# Patient Record
Sex: Female | Born: 2000 | Race: Black or African American | Hispanic: No | Marital: Single | Smoking: Never smoker
Health system: Southern US, Community
[De-identification: ages and names within clinical notes are randomized; demographics above are authoritative.]

## PROBLEM LIST (undated history)

## (undated) DIAGNOSIS — D649 Anemia, unspecified: Secondary | ICD-10-CM

## (undated) DIAGNOSIS — J45909 Unspecified asthma, uncomplicated: Secondary | ICD-10-CM

## (undated) HISTORY — PX: NO PAST SURGERIES: SHX2092

---
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
## 2019-01-13 ENCOUNTER — Inpatient Hospital Stay (HOSPITAL_COMMUNITY): Payer: Medicaid Other

## 2019-01-13 ENCOUNTER — Inpatient Hospital Stay (HOSPITAL_COMMUNITY)
Admission: EM | Admit: 2019-01-13 | Discharge: 2019-01-13 | Disposition: A | Discharge status: Home / Self Care | Payer: Medicaid Other | Attending: Obstetrics and Gynecology | Admitting: Obstetrics and Gynecology

## 2019-01-13 ENCOUNTER — Other Ambulatory Visit: Payer: Self-pay

## 2019-01-13 ENCOUNTER — Encounter (HOSPITAL_COMMUNITY): Payer: Self-pay

## 2019-01-13 DIAGNOSIS — O219 Vomiting of pregnancy, unspecified: Secondary | ICD-10-CM | POA: Diagnosis not present

## 2019-01-13 DIAGNOSIS — R109 Unspecified abdominal pain: Secondary | ICD-10-CM | POA: Diagnosis not present

## 2019-01-13 DIAGNOSIS — O468X1 Other antepartum hemorrhage, first trimester: Secondary | ICD-10-CM

## 2019-01-13 DIAGNOSIS — Z3491 Encounter for supervision of normal pregnancy, unspecified, first trimester: Secondary | ICD-10-CM

## 2019-01-13 DIAGNOSIS — O208 Other hemorrhage in early pregnancy: Secondary | ICD-10-CM | POA: Insufficient documentation

## 2019-01-13 DIAGNOSIS — Z3A01 Less than 8 weeks gestation of pregnancy: Secondary | ICD-10-CM | POA: Insufficient documentation

## 2019-01-13 DIAGNOSIS — O26891 Other specified pregnancy related conditions, first trimester: Secondary | ICD-10-CM | POA: Insufficient documentation

## 2019-01-13 DIAGNOSIS — F17210 Nicotine dependence, cigarettes, uncomplicated: Secondary | ICD-10-CM | POA: Diagnosis not present

## 2019-01-13 DIAGNOSIS — Z79899 Other long term (current) drug therapy: Secondary | ICD-10-CM | POA: Insufficient documentation

## 2019-01-13 DIAGNOSIS — O418X10 Other specified disorders of amniotic fluid and membranes, first trimester, not applicable or unspecified: Secondary | ICD-10-CM | POA: Diagnosis not present

## 2019-01-13 DIAGNOSIS — O99511 Diseases of the respiratory system complicating pregnancy, first trimester: Secondary | ICD-10-CM | POA: Insufficient documentation

## 2019-01-13 DIAGNOSIS — J45909 Unspecified asthma, uncomplicated: Secondary | ICD-10-CM | POA: Insufficient documentation

## 2019-01-13 DIAGNOSIS — O99331 Smoking (tobacco) complicating pregnancy, first trimester: Secondary | ICD-10-CM | POA: Diagnosis not present

## 2019-01-13 LAB — URINALYSIS, ROUTINE W REFLEX MICROSCOPIC
Bacteria, UA: NONE SEEN
Bilirubin Urine: NEGATIVE
Glucose, UA: NEGATIVE mg/dL
Hgb urine dipstick: NEGATIVE
Ketones, ur: 80 mg/dL — AB
Nitrite: NEGATIVE
Protein, ur: NEGATIVE mg/dL
Specific Gravity, Urine: 1.019 (ref 1.005–1.030)
pH: 6 (ref 5.0–8.0)

## 2019-01-13 LAB — POCT PREGNANCY, URINE: Preg Test, Ur: POSITIVE — AB

## 2019-01-13 MED ORDER — FAMOTIDINE IN NACL 20-0.9 MG/50ML-% IV SOLN
20.0000 mg | Freq: Once | INTRAVENOUS | Status: AC
  Administered 2019-01-13: 19:00:00 20 mg via INTRAVENOUS
  Filled 2019-01-13: qty 50

## 2019-01-13 MED ORDER — LACTATED RINGERS IV BOLUS
1000.0000 mL | Freq: Once | INTRAVENOUS | Status: AC
  Administered 2019-01-13: 1000 mL via INTRAVENOUS

## 2019-01-13 MED ORDER — METOCLOPRAMIDE HCL 10 MG PO TABS: 10.0000 mg | ORAL_TABLET | Freq: Three times a day (TID) | ORAL | 0 refills | Status: DC | PRN

## 2019-01-13 MED ORDER — PROMETHAZINE HCL 25 MG/ML IJ SOLN
25.0000 mg | Freq: Once | INTRAMUSCULAR | Status: AC
  Administered 2019-01-13: 19:00:00 25 mg via INTRAVENOUS
  Filled 2019-01-13: qty 1

## 2019-01-13 NOTE — MAU Note (Signed)
Pt states she has been diagnosed with BV but has not started her prescription yet due to n/v

## 2019-01-13 NOTE — MAU Note (Signed)
Carol Hendrix is a 18 y.o. at Unknown here in MAU reporting:  +emesis +lower abdominal pain +headache (history migraines) LMP: patient states she is not sure. Went to [facility] and was told her due date was may 10th.  Onset of complaint: 0600 today. Has taken promethazine that she had with her last pregnancy. Took twice this morning but threw it up. Pain score: 7/10 abdominal pain; 10/10 headache Vitals:   01/13/19 1719  BP: 113/70  Pulse: 67  Resp: 16  Temp: 98.2 F (36.8 C)  SpO2: 100%   Lab orders placed from triage: ua and poc pregnancy test  Rn asked patient to leave urine sample. Patient states she could not at this time. RN explained importance due to c/o emesis and need for pregnancy confirmation.   Patient expressed frustration with wait and asked for explanation on triage process. RN explained process and answered all questions.

## 2019-01-13 NOTE — MAU Provider Note (Signed)
Chief Complaint: Emesis, Abdominal Pain, and Headache   First Provider Initiated Contact with Patient 01/13/19 1752     SUBJECTIVE HPI: Carol Hendrix is a 18 y.o. G2P1001 at 6839w6d who presents to Maternity Admissions reporting nausea/vomiting & abdominal pain. Symptoms started this morning. Has vomited over 10 times today. Does not have antiemetic at home. Reports pain throughout her abdomen that is worse when she vomits. Denies diarrhea, constipation, dysuria, vaginal bleeding, vaginal discharge, or fever/chills. [facility] Had an ultrasound in the office that confirmed IUP & gave her due date of 09/02/19.   Location: abdomen Quality: cramping Severity: 7/10 on pain scale Duration: 1 day Timing: intermittent Modifying factors: worse when she vomits Associated signs and symptoms: n/v  Past Medical History:  Diagnosis Date  . Asthma    OB History  Gravida Para Term Preterm AB Living  2 1 1  0 0 1  SAB TAB Ectopic Multiple Live Births  0 0 0 0 1    # Outcome Date GA Lbr Len/2nd Weight Sex Delivery Anes PTL Lv  2 Current           1 Term 08/01/18 4826w4d 02:18 / 00:31 2999 g F Vag-Spont EPI  LIV     Birth Comments: wnl   History reviewed. No pertinent surgical history. Social History   Socioeconomic History  . Marital status: Single    Spouse name: Not on file  . Number of children: Not on file  . Years of education: Not on file  . Highest education level: Not on file  Occupational History  . Not on file  Social Needs  . Financial resource strain: Not on file  . Food insecurity    Worry: Not on file    Inability: Not on file  . Transportation needs    Medical: Not on file    Non-medical: Not on file  Tobacco Use  . Smoking status: Current Every Day Smoker    Packs/day: 0.25    Types: Cigarettes  . Smokeless tobacco: Never Used  . Tobacco comment: rolls marijuana in with tobacco  Substance and Sexual Activity  . Alcohol use: Yes    Comment: social    . Drug use: Yes    Types: Marijuana    Comment: about a week ago  . Sexual activity: Yes  Lifestyle  . Physical activity    Days per week: Not on file    Minutes per session: Not on file  . Stress: Not on file  Relationships  . Social Musicianconnections    Talks on phone: Not on file    Gets together: Not on file    Attends religious service: Not on file    Active member of club or organization: Not on file    Attends meetings of clubs or organizations: Not on file    Relationship status: Not on file  . Intimate partner violence    Fear of current or ex partner: Not on file    Emotionally abused: Not on file    Physically abused: Not on file    Forced sexual activity: Not on file  Other Topics Concern  . Not on file  Social History Narrative  . Not on file   Family History  Problem Relation Age of Onset  . Healthy Mother   . Healthy Father    No current facility-administered medications on file prior to encounter.    Current Outpatient Medications on File Prior to Encounter  Medication Sig  Dispense Refill  . albuterol (PROVENTIL HFA;VENTOLIN HFA) 108 (90 Base) MCG/ACT inhaler Inhale 1-2 puffs into the lungs every 6 (six) hours as needed for wheezing or shortness of breath. 1 Inhaler 0  . albuterol (PROVENTIL) (2.5 MG/3ML) 0.083% nebulizer solution Take 3 mLs (2.5 mg total) by nebulization every 6 (six) hours as needed for wheezing or shortness of breath. 75 mL 0  . cetirizine (ZYRTEC) 10 MG tablet Take 1 tablet (10 mg total) by mouth daily. 30 tablet 11  . fluticasone (FLONASE) 50 MCG/ACT nasal spray Place 2 sprays into both nostrils daily. 1 g 0  . [DISCONTINUED] ipratropium (ATROVENT) 0.03 % nasal spray Place 2 sprays into both nostrils 2 (two) times daily. (Patient not taking: Reported on 07/31/2018) 30 mL 0   No Known Allergies  I have reviewed patient's Past Medical Hx, Surgical Hx, Family Hx, Social Hx, medications and allergies.   Review of Systems  Constitutional:  Negative.   Gastrointestinal: Positive for abdominal pain, nausea and vomiting. Negative for constipation and diarrhea.  Genitourinary: Negative.     OBJECTIVE Patient Vitals for the past 24 hrs:  BP Temp Temp src Pulse Resp SpO2 Weight  01/13/19 1954 106/72 - - 69 - - -  01/13/19 1719 113/70 98.2 F (36.8 C) Oral 67 16 100 % 63.7 kg   Constitutional: Well-developed, well-nourished female in no acute distress.  Cardiovascular: normal rate & rhythm, no murmur Respiratory: normal rate and effort. Lung sounds clear throughout GI: Abd soft, non-tender, Pos BS x 4. No guarding or rebound tenderness MS: Extremities nontender, no edema, normal ROM Neurologic: Alert and oriented x 4.      LAB RESULTS Results for orders placed or performed during the hospital encounter of 01/13/19 (from the past 24 hour(s))  Urinalysis, Routine w reflex microscopic     Status: Abnormal   Collection Time: 01/13/19  5:40 PM  Result Value Ref Range   Color, Urine YELLOW YELLOW   APPearance HAZY (A) CLEAR   Specific Gravity, Urine 1.019 1.005 - 1.030   pH 6.0 5.0 - 8.0   Glucose, UA NEGATIVE NEGATIVE mg/dL   Hgb urine dipstick NEGATIVE NEGATIVE   Bilirubin Urine NEGATIVE NEGATIVE   Ketones, ur 80 (A) NEGATIVE mg/dL   Protein, ur NEGATIVE NEGATIVE mg/dL   Nitrite NEGATIVE NEGATIVE   Leukocytes,Ua SMALL (A) NEGATIVE   RBC / HPF 0-5 0 - 5 RBC/hpf   WBC, UA 6-10 0 - 5 WBC/hpf   Bacteria, UA NONE SEEN NONE SEEN   Squamous Epithelial / LPF 6-10 0 - 5   Mucus PRESENT   Pregnancy, urine POC     Status: Abnormal   Collection Time: 01/13/19  5:41 PM  Result Value Ref Range   Preg Test, Ur POSITIVE (A) NEGATIVE    IMAGING US Ob Comp Less 14 Wks  Result Date: 01/13/2019 CLINICAL DATA:  Pregnant patient in first-trimester pregnancy with abdominal pain. EXAM: OBSTETRIC <14 WK ULTRASOUND TECHNIQUE: Transabdominal ultrasound was performed for evaluation of the gestation as well as the maternal uterus and adnexal  regions. COMPARISON:  None this pregnancy. FINDINGS: Intrauterine gestational sac: Single Yolk sac:  Visualized. Embryo:  Visualized. Cardiac Activity: Visualized. Heart Rate: 157 bpm CRL:   14.4 mm   7 w 5 d                  [place] EDC: 08/28/2019 Subchorionic hemorrhage: Small, primarily lateral to the gestational sac. Maternal uterus/adnexae: Left ovary is normal measuring 3.4 x 1.8 x  2.1 cm. The right ovary is normal measuring 2.5 x 1.8 x 2.2 cm. No adnexal mass. No pelvic free fluid. IMPRESSION: 1. Single live intrauterine pregnancy estimated gestational age based on crown-rump length 7 weeks 5 days for estimated date of delivery 08/28/2019. 2. Small subchorionic hemorrhage. Electronically Signed   By: Narda Rutherford M.D.   On: 01/13/2019 19:40    MAU COURSE Orders Placed This Encounter  Procedures  . US OB Comp Less 14 Wks  . Urinalysis, Routine w reflex microscopic  . Pregnancy, urine POC  . Discharge patient   Meds ordered this encounter  Medications  . lactated ringers bolus 1,000 mL  . promethazine (PHENERGAN) injection 25 mg  . famotidine (PEPCID) IVPB 20 mg premix  . metoCLOPramide (REGLAN) 10 MG tablet    Sig: Take 1 tablet (10 mg total) by mouth every 8 (eight) hours as needed for nausea.    Dispense:  30 tablet    Refill:  0    Order Specific Question:   Supervising Provider    Answer:   CONSTANT, PEGGY [[id number]]    MDM VSS. Abdomen soft & non tender on exam  IV fluids, phenergan, & pepcid given. Pt vomited once after meds given then reports feeling better and able to keep down water  Ultrasound shows live IUP with small [facility]. Reviewed results with patient  ASSESSMENT 1. Nausea and vomiting during pregnancy prior to [redacted] weeks gestation   2. Abdominal pain during pregnancy in first trimester   3. Normal IUP (intrauterine pregnancy) on prenatal ultrasound, first trimester   4. Subchorionic hematoma in first trimester, single or unspecified fetus     PLAN Discharge home in  stable condition. Discussed reasons to return to MAU Rx reglan  Follow-up Information    Ob/Gyn, [facility] Follow up.   Contact information: [address] [place] [address] [phone]          Allergies as of 01/13/2019   No Known Allergies     Medication List    TAKE these medications   albuterol 108 (90 Base) MCG/ACT inhaler Commonly known as: VENTOLIN HFA Inhale 1-2 puffs into the lungs every 6 (six) hours as needed for wheezing or shortness of breath.   albuterol (2.5 MG/3ML) 0.083% nebulizer solution Commonly known as: PROVENTIL Take 3 mLs (2.5 mg total) by nebulization every 6 (six) hours as needed for wheezing or shortness of breath.   cetirizine 10 MG tablet Commonly known as: ZYRTEC Take 1 tablet (10 mg total) by mouth daily.   fluticasone 50 MCG/ACT nasal spray Commonly known as: FLONASE Place 2 sprays into both nostrils daily.   metoCLOPramide 10 MG tablet Commonly known as: REGLAN Take 1 tablet (10 mg total) by mouth every 8 (eight) hours as needed for nausea.        Judeth Horn, NP 01/13/2019  8:04 PM

## 2019-01-13 NOTE — Discharge Instructions (Signed)
Morning Sickness ° °Morning sickness is when a woman feels nauseous during pregnancy. This nauseous feeling may or may not come with vomiting. It often occurs in the morning, but it can be a problem at any time of day. Morning sickness is most common during the first trimester. In some cases, it may continue throughout pregnancy. Although morning sickness is unpleasant, it is usually harmless unless the woman develops severe and continual vomiting (hyperemesis gravidarum), a condition that requires more intense treatment. °What are the causes? °The exact cause of this condition is not known, but it seems to be related to normal hormonal changes that occur in pregnancy. °What increases the risk? °You are more likely to develop this condition if: °· You experienced nausea or vomiting before your pregnancy. °· You had morning sickness during a previous pregnancy. °· You are pregnant with more than one baby, such as twins. °What are the signs or symptoms? °Symptoms of this condition include: °· Nausea. °· Vomiting. °How is this diagnosed? °This condition is usually diagnosed based on your signs and symptoms. °How is this treated? °In many cases, treatment is not needed for this condition. Making some changes to what you eat may help to control symptoms. Your health care provider may also prescribe or recommend: °· Vitamin B6 supplements. °· Anti-nausea medicines. °· Ginger. °Follow these instructions at home: °Medicines °· Take over-the-counter and prescription medicines only as told by your health care provider. Do not use any prescription, over-the-counter, or herbal medicines for morning sickness without first talking with your health care provider. °· Taking multivitamins before getting pregnant can prevent or decrease the severity of morning sickness in most women. °Eating and drinking °· Eat a piece of dry toast or crackers before getting out of bed in the morning. °· Eat 5 or 6 small meals a day. °· Eat dry and  bland foods, such as rice or a baked potato. Foods that are high in carbohydrates are often helpful. °· Avoid greasy, fatty, and spicy foods. °· Have someone cook for you if the smell of any food causes nausea and vomiting. °· If you feel nauseous after taking prenatal vitamins, take the vitamins at night or with a snack. °· Snack on protein foods between meals if you are hungry. Nuts, yogurt, and cheese are good options. °· Drink fluids throughout the day. °· Try ginger ale made with real ginger, ginger tea made from fresh grated ginger, or ginger candies. °General instructions °· Do not use any products that contain nicotine or tobacco, such as cigarettes and e-cigarettes. If you need help quitting, ask your health care provider. °· Get an air purifier to keep the air in your house free of odors. °· Get plenty of fresh air. °· Try to avoid odors that trigger your nausea. °· Consider trying these methods to help relieve symptoms: °? Wearing an acupressure wristband. These wristbands are often worn for seasickness. °? Acupuncture. °Contact a health care provider if: °· Your home remedies are not working and you need medicine. °· You feel dizzy or light-headed. °· You are losing weight. °Get help right away if: °· You have persistent and uncontrolled nausea and vomiting. °· You faint. °· You have severe pain in your abdomen. °Summary °· Morning sickness is when a woman feels nauseous during pregnancy. This nauseous feeling may or may not come with vomiting. °· Morning sickness is most common during the first trimester. °· It often occurs in the morning, but it can be a problem at   any time of day. °· In many cases, treatment is not needed for this condition. Making some changes to what you eat may help to control symptoms. °This information is not intended to replace advice given to you by your health care provider. Make sure you discuss any questions you have with your health care provider. °Document Released:  06/02/2006 Document Revised: 03/24/2017 Document Reviewed: 05/14/2016 °Elsevier Patient Education © 2020 Elsevier Inc. ° °

## 2019-01-13 NOTE — MAU Note (Signed)
Reports she had an u/s in the office and they saw a heartbeat

## 2019-02-07 LAB — OB RESULTS CONSOLE RUBELLA ANTIBODY, IGM: Rubella: IMMUNE

## 2019-02-07 LAB — OB RESULTS CONSOLE GC/CHLAMYDIA
Chlamydia: NEGATIVE
Gonorrhea: NEGATIVE

## 2019-02-07 LAB — OB RESULTS CONSOLE ANTIBODY SCREEN: Antibody Screen: NEGATIVE

## 2019-02-07 LAB — OB RESULTS CONSOLE ABO/RH: RH Type: POSITIVE

## 2019-02-07 LAB — OB RESULTS CONSOLE HIV ANTIBODY (ROUTINE TESTING): HIV: NONREACTIVE

## 2019-02-07 LAB — OB RESULTS CONSOLE RPR: RPR: NONREACTIVE

## 2019-02-07 LAB — OB RESULTS CONSOLE HEPATITIS B SURFACE ANTIGEN: Hepatitis B Surface Ag: NEGATIVE

## 2019-03-20 ENCOUNTER — Encounter (HOSPITAL_COMMUNITY): Payer: Self-pay | Admitting: *Deleted

## 2019-03-20 ENCOUNTER — Inpatient Hospital Stay (HOSPITAL_COMMUNITY)
Admission: EM | Admit: 2019-03-20 | Discharge: 2019-03-20 | Disposition: A | Discharge status: Home / Self Care | Payer: Medicaid Other | Attending: Obstetrics | Admitting: Obstetrics

## 2019-03-20 ENCOUNTER — Other Ambulatory Visit: Payer: Self-pay

## 2019-03-20 DIAGNOSIS — Z79899 Other long term (current) drug therapy: Secondary | ICD-10-CM | POA: Insufficient documentation

## 2019-03-20 DIAGNOSIS — O99512 Diseases of the respiratory system complicating pregnancy, second trimester: Secondary | ICD-10-CM | POA: Insufficient documentation

## 2019-03-20 DIAGNOSIS — E860 Dehydration: Secondary | ICD-10-CM | POA: Diagnosis not present

## 2019-03-20 DIAGNOSIS — O219 Vomiting of pregnancy, unspecified: Secondary | ICD-10-CM | POA: Diagnosis present

## 2019-03-20 DIAGNOSIS — Z87891 Personal history of nicotine dependence: Secondary | ICD-10-CM | POA: Insufficient documentation

## 2019-03-20 DIAGNOSIS — Z3A16 16 weeks gestation of pregnancy: Secondary | ICD-10-CM | POA: Diagnosis not present

## 2019-03-20 DIAGNOSIS — O99282 Endocrine, nutritional and metabolic diseases complicating pregnancy, second trimester: Secondary | ICD-10-CM | POA: Insufficient documentation

## 2019-03-20 DIAGNOSIS — J45909 Unspecified asthma, uncomplicated: Secondary | ICD-10-CM | POA: Diagnosis not present

## 2019-03-20 DIAGNOSIS — O210 Mild hyperemesis gravidarum: Secondary | ICD-10-CM | POA: Diagnosis not present

## 2019-03-20 LAB — URINALYSIS, ROUTINE W REFLEX MICROSCOPIC
Bacteria, UA: NONE SEEN
Bilirubin Urine: NEGATIVE
Glucose, UA: NEGATIVE mg/dL
Hgb urine dipstick: NEGATIVE
Ketones, ur: 80 mg/dL — AB
Leukocytes,Ua: NEGATIVE
Nitrite: NEGATIVE
Protein, ur: 30 mg/dL — AB
Specific Gravity, Urine: 1.026 (ref 1.005–1.030)
pH: 6 (ref 5.0–8.0)

## 2019-03-20 LAB — CBC
HCT: 34.7 % — ABNORMAL LOW (ref 36.0–46.0)
Hemoglobin: 11.2 g/dL — ABNORMAL LOW (ref 12.0–15.0)
MCH: 26 pg (ref 26.0–34.0)
MCHC: 32.3 g/dL (ref 30.0–36.0)
MCV: 80.7 fL (ref 80.0–100.0)
Platelets: 244 10*3/uL (ref 150–400)
RBC: 4.3 MIL/uL (ref 3.87–5.11)
RDW: 16.1 % — ABNORMAL HIGH (ref 11.5–15.5)
WBC: 9.8 10*3/uL (ref 4.0–10.5)
nRBC: 0 % (ref 0.0–0.2)

## 2019-03-20 LAB — BASIC METABOLIC PANEL
Anion gap: 13 (ref 5–15)
BUN: 5 mg/dL — ABNORMAL LOW (ref 6–20)
CO2: 18 mmol/L — ABNORMAL LOW (ref 22–32)
Calcium: 8.8 mg/dL — ABNORMAL LOW (ref 8.9–10.3)
Chloride: 105 mmol/L (ref 98–111)
Creatinine, Ser: 0.63 mg/dL (ref 0.44–1.00)
GFR calc Af Amer: >60 mL/min (ref >60)
GFR calc non Af Amer: >60 mL/min (ref >60)
Glucose, Bld: 68 mg/dL — ABNORMAL LOW (ref 70–99)
Potassium: 3.5 mmol/L (ref 3.5–5.1)
Sodium: 136 mmol/L (ref 135–145)

## 2019-03-20 MED ORDER — ONDANSETRON 4 MG PO TBDP: 4.0000 mg | ORAL_TABLET | Freq: Three times a day (TID) | ORAL | 0 refills | Status: DC | PRN

## 2019-03-20 MED ORDER — SODIUM CHLORIDE 0.9 % IV SOLN
8.0000 mg | Freq: Once | INTRAVENOUS | Status: AC
  Administered 2019-03-20: 8 mg via INTRAVENOUS
  Filled 2019-03-20: qty 4

## 2019-03-20 MED ORDER — LACTATED RINGERS IV BOLUS
1000.0000 mL | Freq: Once | INTRAVENOUS | Status: AC
  Administered 2019-03-20: 1000 mL via INTRAVENOUS

## 2019-03-20 MED ORDER — FAMOTIDINE IN NACL 20-0.9 MG/50ML-% IV SOLN
20.0000 mg | Freq: Once | INTRAVENOUS | Status: AC
  Administered 2019-03-20: 20 mg via INTRAVENOUS
  Filled 2019-03-20 (×2): qty 50

## 2019-03-20 NOTE — MAU Provider Note (Signed)
History     CSN: [id number]  Arrival date and time: 03/20/19 1242   First Provider Initiated Contact with Patient 03/20/19 1500      Chief Complaint  Patient presents with  . Emesis   18 G2P1001 @16 .2 wks presenting with N/V. Sx have been ongoing throughout pregnancy but wax and wane. Two days ago she started having worsening N/V. She cannot tolerate anything po. Denies fevers or diarrhea. She states Zofran helped her in the past but she didn't have any. Denies VB, LOF, or abd pain.   OB History    Gravida  2   Para  1   Term  1   Preterm  0   AB  0   Living  1     SAB  0   TAB  0   Ectopic  0   Multiple  0   Live Births  1           Past Medical History:  Diagnosis Date  . Asthma     Past Surgical History:  Procedure Laterality Date  . NO PAST SURGERIES      Family History  Problem Relation Age of Onset  . Healthy Mother   . Healthy Father     Social History   Tobacco Use  . Smoking status: Former Smoker    Packs/day: 0.25    Types: Cigarettes  . Smokeless tobacco: Never Used  . Tobacco comment: rolls marijuana in with tobacco  Substance Use Topics  . Alcohol use: Yes    Comment: social   . Drug use: Yes    Types: Marijuana    Comment: last smoked 03/19/2019    Allergies: No Known Allergies  Medications Prior to Admission  Medication Sig Dispense Refill Last Dose  . albuterol (PROVENTIL HFA;VENTOLIN HFA) 108 (90 Base) MCG/ACT inhaler Inhale 1-2 puffs into the lungs every 6 (six) hours as needed for wheezing or shortness of breath. 1 Inhaler 0 More than a month at Unknown time  . albuterol (PROVENTIL) (2.5 MG/3ML) 0.083% nebulizer solution Take 3 mLs (2.5 mg total) by nebulization every 6 (six) hours as needed for wheezing or shortness of breath. 75 mL 0   . cetirizine (ZYRTEC) 10 MG tablet Take 1 tablet (10 mg total) by mouth daily. 30 tablet 11   . fluticasone (FLONASE) 50 MCG/ACT nasal spray Place 2 sprays into both nostrils  daily. 1 g 0   . metoCLOPramide (REGLAN) 10 MG tablet Take 1 tablet (10 mg total) by mouth every 8 (eight) hours as needed for nausea. 30 tablet 0     Review of Systems  Constitutional: Negative for chills and fever.  Gastrointestinal: Positive for nausea and vomiting. Negative for abdominal pain.  Genitourinary: Negative for vaginal bleeding and vaginal discharge.   Physical Exam   Blood pressure 108/61, pulse 91, temperature 98.3 F (36.8 C), temperature source Oral, resp. rate 16, height 5\' 5"  (1.651 m), weight 62 kg, last menstrual period 10/28/2018, SpO2 100 %, not currently breastfeeding.  Physical Exam  Nursing note and vitals reviewed. Constitutional: She is oriented to person, place, and time. She appears well-developed and well-nourished. No distress.  HENT:  Head: Normocephalic and atraumatic.  Neck: Normal range of motion.  Cardiovascular: Normal rate.  Respiratory: No respiratory distress.  GI: Soft. She exhibits no distension. There is no abdominal tenderness.  Musculoskeletal: Normal range of motion.  Neurological: She is alert and oriented to person, place, and time.  Skin: Skin is warm and  dry.  FHT 147  Results for orders placed or performed during the hospital encounter of 03/20/19 (from the past 24 hour(s))  Urinalysis, Routine w reflex microscopic     Status: Abnormal   Collection Time: 03/20/19  2:51 PM  Result Value Ref Range   Color, Urine YELLOW YELLOW   APPearance HAZY (A) CLEAR   Specific Gravity, Urine 1.026 1.005 - 1.030   pH 6.0 5.0 - 8.0   Glucose, UA NEGATIVE NEGATIVE mg/dL   Hgb urine dipstick NEGATIVE NEGATIVE   Bilirubin Urine NEGATIVE NEGATIVE   Ketones, ur 80 (A) NEGATIVE mg/dL   Protein, ur 30 (A) NEGATIVE mg/dL   Nitrite NEGATIVE NEGATIVE   Leukocytes,Ua NEGATIVE NEGATIVE   RBC / HPF 0-5 0 - 5 RBC/hpf   WBC, UA 0-5 0 - 5 WBC/hpf   Bacteria, UA NONE SEEN NONE SEEN   Squamous Epithelial / LPF 0-5 0 - 5   Mucus PRESENT   Basic  metabolic panel     Status: Abnormal   Collection Time: 03/20/19  3:45 PM  Result Value Ref Range   Sodium 136 135 - 145 mmol/L   Potassium 3.5 3.5 - 5.1 mmol/L   Chloride 105 98 - 111 mmol/L   CO2 18 (L) 22 - 32 mmol/L   Glucose, Bld 68 (L) 70 - 99 mg/dL   BUN 5 (L) 6 - 20 mg/dL   Creatinine, Ser 6.60 0.44 - 1.00 mg/dL   Calcium 8.8 (L) 8.9 - 10.3 mg/dL   GFR calc non Af Amer >60 >60 mL/min   GFR calc Af Amer >60 >60 mL/min   Anion gap 13 5 - 15  CBC     Status: Abnormal   Collection Time: 03/20/19  3:45 PM  Result Value Ref Range   WBC 9.8 4.0 - 10.5 K/uL   RBC 4.30 3.87 - 5.11 MIL/uL   Hemoglobin 11.2 (L) 12.0 - 15.0 g/dL   HCT 63.0 (L) 16.0 - 10.9 %   MCV 80.7 80.0 - 100.0 fL   MCH 26.0 26.0 - 34.0 pg   MCHC 32.3 30.0 - 36.0 g/dL   RDW 32.3 (H) 55.7 - 32.2 %   Platelets 244 150 - 400 K/uL   nRBC 0.0 0.0 - 0.2 %   MAU Course  Procedures Meds ordered this encounter  Medications  . lactated ringers bolus 1,000 mL  . famotidine (PEPCID) IVPB 20 mg premix  . ondansetron (ZOFRAN) 8 mg in sodium chloride 0.9 % 50 mL IVPB  . ondansetron (ZOFRAN ODT) 4 MG disintegrating tablet    Sig: Take 1 tablet (4 mg total) by mouth every 8 (eight) hours as needed for nausea or vomiting.    Dispense:  20 tablet    Refill:  0    Order Specific Question:   Supervising Provider    Answer:   Conan Bowens [[id number]]   MDM Labs ordered and reviewed. Feeling better after meds. No emesis. Tolerating ginger ale and crackers. Stable for discharge home.  Assessment and Plan   1. [redacted] weeks gestation of pregnancy   2. Morning sickness   3. Dehydration    Discharge home Follow up at [facility] as scheduled Return precautions Rx Zofran ODT  Allergies as of 03/20/2019   No Known Allergies     Medication List    TAKE these medications   albuterol 108 (90 Base) MCG/ACT inhaler Commonly known as: VENTOLIN HFA Inhale 1-2 puffs into the lungs every 6 (six) hours as  needed for wheezing  or shortness of breath.   albuterol (2.5 MG/3ML) 0.083% nebulizer solution Commonly known as: PROVENTIL Take 3 mLs (2.5 mg total) by nebulization every 6 (six) hours as needed for wheezing or shortness of breath.   cetirizine 10 MG tablet Commonly known as: ZYRTEC Take 1 tablet (10 mg total) by mouth daily.   fluticasone 50 MCG/ACT nasal spray Commonly known as: FLONASE Place 2 sprays into both nostrils daily.   metoCLOPramide 10 MG tablet Commonly known as: REGLAN Take 1 tablet (10 mg total) by mouth every 8 (eight) hours as needed for nausea.   ondansetron 4 MG disintegrating tablet Commonly known as: Zofran ODT Take 1 tablet (4 mg total) by mouth every 8 (eight) hours as needed for nausea or vomiting.       Julianne Handler, CNM 03/20/2019, 5:57 PM

## 2019-03-20 NOTE — Discharge Instructions (Signed)
Morning Sickness  Morning sickness is when you feel sick to your stomach (nauseous) during pregnancy. You may feel sick to your stomach and throw up (vomit). You may feel sick in the morning, but you can feel this way at any time of day. Some women feel very sick to their stomach and cannot stop throwing up (hyperemesis gravidarum). Follow these instructions at home: Medicines  Take over-the-counter and prescription medicines only as told by your doctor. Do not take any medicines until you talk with your doctor about them first.  Taking multivitamins before getting pregnant can stop or lessen the harshness of morning sickness. Eating and drinking  Eat dry toast or crackers before getting out of bed.  Eat 5 or 6 small meals a day.  Eat dry and bland foods like rice and baked potatoes.  Do not eat greasy, fatty, or spicy foods.  Have someone cook for you if the smell of food causes you to feel sick or throw up.  If you feel sick to your stomach after taking prenatal vitamins, take them at night or with a snack.  Eat protein when you need a snack. Nuts, yogurt, and cheese are good choices.  Drink fluids throughout the day.  Try ginger ale made with real ginger, ginger tea made from fresh grated ginger, or ginger candies. General instructions  Do not use any products that have nicotine or tobacco in them, such as cigarettes and e-cigarettes. If you need help quitting, ask your doctor.  Use an air purifier to keep the air in your house free of smells.  Get lots of fresh air.  Try to avoid smells that make you feel sick.  Try: ? Wearing a bracelet that is used for seasickness (acupressure wristband). ? Going to a doctor who puts thin needles into certain body points (acupuncture) to improve how you feel. Contact a doctor if:  You need medicine to feel better.  You feel dizzy or light-headed.  You are losing weight. Get help right away if:  You feel very sick to your  stomach and cannot stop throwing up.  You pass out (faint).  You have very bad pain in your belly. Summary  Morning sickness is when you feel sick to your stomach (nauseous) during pregnancy.  You may feel sick in the morning, but you can feel this way at any time of day.  Making some changes to what you eat may help your symptoms go away. This information is not intended to replace advice given to you by your health care provider. Make sure you discuss any questions you have with your health care provider. Document Released: 05/19/2004 Document Revised: 03/24/2017 Document Reviewed: 05/12/2016 Elsevier Patient Education  2020 Elsevier Inc.  

## 2019-03-20 NOTE — MAU Note (Signed)
Been throwing up for like 2 days.  Hasn't been able to eat since Monday.  Her dr thought she needed to come in for fluids and medication for nausea, is probably dehydrated.

## 2019-04-26 NOTE — L&D Delivery Note (Addendum)
Delivery Note Called to [facility] by RN for precip delivery attended by RN. Pts MD is in emergency in other room. At 2:20 PM a viable and healthy female was delivered via  (Presentation: vertex ).  APGAR: 8, 9; weight pending.   Placenta status: Retained. Large amount of blood in amniotic sac protruding from vagina. Placenta still did not deliver with gentle cord traction, maternal pushing and position changes. Placenta manually removed from the LUS, Intact.  Scant bleeding to follow. Cord: 3VC  with the following complications: None.  Cord pH: NA.   Bleeding stable but will give Methergine PO x 1 now due to pt starting w/ 9 Hgb, planning to decline blood products if indicated and QBL 426 ml.   Episiotomy:  None Lacerations:  None Suture Repair: NA Est. Blood Loss (mL): 426  Mom to postpartum.  Baby to Couplet care / Skin to Skin.  Dorathy Kinsman 08/21/2019, 2:51 PM  I was attending an obstetric emergency and a different patient's room and was unable to attend the patient's delivery.  Appreciate the work of the outstanding RNs attended the delivery and the nurse midwife who assisted with placental delivery  Waynard Reeds 08/21/2019 3:44pm

## 2019-06-16 ENCOUNTER — Encounter (HOSPITAL_COMMUNITY): Payer: Self-pay | Admitting: Emergency Medicine

## 2019-06-16 ENCOUNTER — Other Ambulatory Visit: Payer: Self-pay

## 2019-06-16 ENCOUNTER — Ambulatory Visit (HOSPITAL_COMMUNITY)
Admission: EM | Admit: 2019-06-16 | Discharge: 2019-06-16 | Disposition: A | Discharge status: Home / Self Care | Payer: Medicaid Other | Attending: Emergency Medicine | Admitting: Emergency Medicine

## 2019-06-16 DIAGNOSIS — Z3A30 30 weeks gestation of pregnancy: Secondary | ICD-10-CM | POA: Diagnosis present

## 2019-06-16 DIAGNOSIS — N949 Unspecified condition associated with female genital organs and menstrual cycle: Secondary | ICD-10-CM | POA: Diagnosis not present

## 2019-06-16 NOTE — ED Provider Notes (Signed)
[facility]    CSN: [id number] Arrival date & time: 06/16/19  1541      History   Chief Complaint Chief Complaint  Patient presents with  . vaginal irritation    HPI Carol Hendrix is a 19 y.o. female.   Carol Hendrix presents with complaints of vaginal soreness, swelling, itching and irritation which she noted two days ago. No obvious discharge, no odor. Denies any previous similar. Took a bath which helped and has been applying vaseline. 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 She is [redacted] weeks pregnant. Next ob appointment in 2 days. She was recently sexually active after having been abstinent for a few months. Didn't use condoms. Denies any known concern for or exposure to std. No pelvic or abdominal pain. No vaginal bleeding or leaking fluid.    ROS per HPI, negative if not otherwise mentioned.      Past Medical History:  Diagnosis Date  . Asthma     Patient Active Problem List   Diagnosis Date Noted  . Postpartum state 08/01/2018  . Indication for care in labor or delivery 07/31/2018  . [redacted] weeks gestation of pregnancy 03/25/2018  . Moderate persistent asthma 03/25/2018  . Tobacco use disorder 03/25/2018    Past Surgical History:  Procedure Laterality Date  . NO PAST SURGERIES      OB History    Gravida  2   Para  1   Term  1   Preterm  0   AB  0   Living  1     SAB  0   TAB  0   Ectopic  0   Multiple  0   Live Births  1            Home Medications    Prior to Admission medications   Medication Sig Start Date End Date Taking? Authorizing Provider  albuterol (PROVENTIL HFA;VENTOLIN HFA) 108 (90 Base) MCG/ACT inhaler Inhale 1-2 puffs into the lungs every 6 (six) hours as needed for wheezing or shortness of breath. 03/13/18  Yes Yu, Amy V, PA-C  albuterol (PROVENTIL) (2.5 MG/3ML) 0.083% nebulizer solution Take 3 mLs (2.5 mg total) by nebulization every 6 (six) hours as needed for  wheezing or shortness of breath. 03/13/18  Yes Yu, Amy V, PA-C  ondansetron (ZOFRAN ODT) 4 MG disintegrating tablet Take 1 tablet (4 mg total) by mouth every 8 (eight) hours as needed for nausea or vomiting. 03/20/19  Yes Donette Larry, CNM  cetirizine (ZYRTEC) 10 MG tablet Take 1 tablet (10 mg total) by mouth daily. 03/19/18   Bing Neighbors, FNP  fluticasone (FLONASE) 50 MCG/ACT nasal spray Place 2 sprays into both nostrils daily. 03/13/18   Cathie Hoops, Amy V, PA-C  metoCLOPramide (REGLAN) 10 MG tablet Take 1 tablet (10 mg total) by mouth every 8 (eight) hours as needed for nausea. 01/13/19   Judeth Horn, NP  ipratropium (ATROVENT) 0.03 % nasal spray Place 2 sprays into both nostrils 2 (two) times daily. Patient not taking: Reported on 07/31/2018 06/21/18 11/13/18  Bing Neighbors, FNP    Family History Family History  Problem Relation Age of Onset  . Healthy Mother   . Healthy Father     Social History Social History   Tobacco Use  . Smoking status: Former Smoker    Packs/day: 0.25    Types: Cigarettes  . Smokeless tobacco: Never Used  . Tobacco comment: rolls  marijuana in with tobacco  Substance Use Topics  . Alcohol use: Yes    Comment: social   . Drug use: Yes    Types: Marijuana    Comment: last smoked 03/19/2019     Allergies   Patient has no known allergies.   Review of Systems Review of Systems   Physical Exam Triage Vital Signs ED Triage Vitals  Enc Vitals Group     BP 06/16/19 1626 100/65     Pulse Rate 06/16/19 1626 75     Resp 06/16/19 1626 18     Temp 06/16/19 1626 99 F (37.2 C)     Temp Source 06/16/19 1626 Oral     SpO2 06/16/19 1626 98 %     Weight --      Height --      Head Circumference --      Peak Flow --      Pain Score 06/16/19 1624 4     Pain Loc --      Pain Edu? --      Excl. in GC? --    No data found.  Updated Vital Signs BP 100/65 (BP Location: Right Arm)   Pulse 75   Temp 99 F (37.2 C) (Oral)   Resp 18   LMP  10/28/2018 (Exact Date)   SpO2 98%   Physical Exam Constitutional:      General: She is not in acute distress.    Appearance: She is well-developed.  Cardiovascular:     Rate and Rhythm: Normal rate.  Pulmonary:     Effort: Pulmonary effort is normal.  Abdominal:     Palpations: Abdomen is not rigid.     Tenderness: There is no abdominal tenderness. There is no guarding or rebound.  Genitourinary:    Comments: Denies sores, lesions, vaginal bleeding; no pelvic pain; gu exam deferred at this time, vaginal self swab collected.   Skin:    General: Skin is warm and dry.  Neurological:     Mental Status: She is alert and oriented to person, place, and time.      UC Treatments / Results  Labs (all labs ordered are listed, but only abnormal results are displayed) Labs Reviewed  CERVICOVAGINAL ANCILLARY ONLY    EKG   Radiology No results found.  Procedures Procedures (including critical care time)  Medications Ordered in UC Medications - No data to display  Initial Impression / Assessment and Plan / UC Course  I have reviewed the triage vital signs and the nursing notes.  Pertinent labs & imaging results that were available during my care of the patient were reviewed by me and considered in my medical decision making (see chart for details).     Vaginal cytology by self swab collected and pending. Will await final results to initiate medication as patient is pregnant. Discussed changes during later pregnancy as well. Follow with ob in two days as already scheduled. Return precautions provided. Patient verbalized understanding and agreeable to plan.   Final Clinical Impressions(s) / UC Diagnoses   Final diagnoses:  Vaginal discomfort     Discharge Instructions     We are testing your vagina for any sort of infectious source of discomfort of your symptoms.  Will notify of any positive findings and if any changes to treatment are needed.   There can be some new  swelling and pressure type sensation with pregnancy which can be normal.  Continue to follow with your OB for recheck as needed.  Please be seen immediately for any vaginal bleeding or leaking of fluids.  Use of condoms with intercourse are recommended.    ED Prescriptions    None     PDMP not reviewed this encounter.   Zigmund Gottron, NP 06/16/19 2121

## 2019-06-16 NOTE — Discharge Instructions (Signed)
We are testing your vagina for any sort of infectious source of discomfort of your symptoms.  Will notify of any positive findings and if any changes to treatment are needed.   There can be some new swelling and pressure type sensation with pregnancy which can be normal.  Continue to follow with your OB for recheck as needed.  Please be seen immediately for any vaginal bleeding or leaking of fluids.  Use of condoms with intercourse are recommended.

## 2019-06-16 NOTE — ED Triage Notes (Signed)
Pt reports vaginal irritation and swelling x2 days.  She denies any vaginal discharge.

## 2019-06-18 ENCOUNTER — Telehealth (HOSPITAL_COMMUNITY): Payer: Self-pay | Admitting: Emergency Medicine

## 2019-06-18 LAB — CERVICOVAGINAL ANCILLARY ONLY
Bacterial vaginitis: NEGATIVE
Candida vaginitis: POSITIVE — AB
Chlamydia: NEGATIVE
Neisseria Gonorrhea: NEGATIVE
Trichomonas: NEGATIVE

## 2019-06-18 NOTE — Telephone Encounter (Signed)
Patient contacted by phone and made aware of    results. Pt verbalized understanding and had all questions answered.  Pt states she was given prescription from her doctor for yeast.

## 2019-07-20 ENCOUNTER — Inpatient Hospital Stay (HOSPITAL_COMMUNITY)
Admission: AD | Admit: 2019-07-20 | Discharge: 2019-07-20 | Disposition: A | Discharge status: Home / Self Care | Payer: Medicaid Other | Attending: Obstetrics and Gynecology | Admitting: Obstetrics and Gynecology

## 2019-07-20 ENCOUNTER — Encounter (HOSPITAL_COMMUNITY): Payer: Self-pay | Admitting: Obstetrics and Gynecology

## 2019-07-20 ENCOUNTER — Other Ambulatory Visit: Payer: Self-pay

## 2019-07-20 DIAGNOSIS — Z87891 Personal history of nicotine dependence: Secondary | ICD-10-CM | POA: Diagnosis not present

## 2019-07-20 DIAGNOSIS — O99283 Endocrine, nutritional and metabolic diseases complicating pregnancy, third trimester: Secondary | ICD-10-CM | POA: Diagnosis not present

## 2019-07-20 DIAGNOSIS — R1031 Right lower quadrant pain: Secondary | ICD-10-CM | POA: Diagnosis not present

## 2019-07-20 DIAGNOSIS — R109 Unspecified abdominal pain: Secondary | ICD-10-CM | POA: Diagnosis not present

## 2019-07-20 DIAGNOSIS — O99013 Anemia complicating pregnancy, third trimester: Secondary | ICD-10-CM | POA: Insufficient documentation

## 2019-07-20 DIAGNOSIS — O26893 Other specified pregnancy related conditions, third trimester: Secondary | ICD-10-CM

## 2019-07-20 DIAGNOSIS — N859 Noninflammatory disorder of uterus, unspecified: Secondary | ICD-10-CM

## 2019-07-20 DIAGNOSIS — R1032 Left lower quadrant pain: Secondary | ICD-10-CM | POA: Insufficient documentation

## 2019-07-20 DIAGNOSIS — Z3A34 34 weeks gestation of pregnancy: Secondary | ICD-10-CM | POA: Insufficient documentation

## 2019-07-20 DIAGNOSIS — D649 Anemia, unspecified: Secondary | ICD-10-CM | POA: Insufficient documentation

## 2019-07-20 DIAGNOSIS — R42 Dizziness and giddiness: Secondary | ICD-10-CM

## 2019-07-20 DIAGNOSIS — E860 Dehydration: Secondary | ICD-10-CM | POA: Insufficient documentation

## 2019-07-20 DIAGNOSIS — N858 Other specified noninflammatory disorders of uterus: Secondary | ICD-10-CM

## 2019-07-20 HISTORY — DX: Anemia, unspecified: D64.9

## 2019-07-20 LAB — CBC WITH DIFFERENTIAL/PLATELET
Abs Immature Granulocytes: 0.05 10*3/uL (ref 0.00–0.07)
Basophils Absolute: 0 10*3/uL (ref 0.0–0.1)
Basophils Relative: 0 %
Eosinophils Absolute: 0.1 10*3/uL (ref 0.0–0.5)
Eosinophils Relative: 1 %
HCT: 28.8 % — ABNORMAL LOW (ref 36.0–46.0)
Hemoglobin: 9 g/dL — ABNORMAL LOW (ref 12.0–15.0)
Immature Granulocytes: 1 %
Lymphocytes Relative: 20 %
Lymphs Abs: 1.7 10*3/uL (ref 0.7–4.0)
MCH: 24.7 pg — ABNORMAL LOW (ref 26.0–34.0)
MCHC: 31.3 g/dL (ref 30.0–36.0)
MCV: 78.9 fL — ABNORMAL LOW (ref 80.0–100.0)
Monocytes Absolute: 0.6 10*3/uL (ref 0.1–1.0)
Monocytes Relative: 7 %
Neutro Abs: 6.2 10*3/uL (ref 1.7–7.7)
Neutrophils Relative %: 71 %
Platelets: 230 10*3/uL (ref 150–400)
RBC: 3.65 MIL/uL — ABNORMAL LOW (ref 3.87–5.11)
RDW: 15.1 % (ref 11.5–15.5)
WBC: 8.6 10*3/uL (ref 4.0–10.5)
nRBC: 0 % (ref 0.0–0.2)

## 2019-07-20 LAB — URINALYSIS, ROUTINE W REFLEX MICROSCOPIC
Bacteria, UA: NONE SEEN
Bilirubin Urine: NEGATIVE
Glucose, UA: NEGATIVE mg/dL
Hgb urine dipstick: NEGATIVE
Ketones, ur: NEGATIVE mg/dL
Leukocytes,Ua: NEGATIVE
Nitrite: NEGATIVE
Protein, ur: 30 mg/dL — AB
Specific Gravity, Urine: 1.026 (ref 1.005–1.030)
pH: 6 (ref 5.0–8.0)

## 2019-07-20 LAB — COMPREHENSIVE METABOLIC PANEL
ALT: 10 U/L (ref 0–44)
AST: 17 U/L (ref 15–41)
Albumin: 2.8 g/dL — ABNORMAL LOW (ref 3.5–5.0)
Alkaline Phosphatase: 99 U/L (ref 38–126)
Anion gap: 10 (ref 5–15)
BUN: 9 mg/dL (ref 6–20)
CO2: 20 mmol/L — ABNORMAL LOW (ref 22–32)
Calcium: 8.7 mg/dL — ABNORMAL LOW (ref 8.9–10.3)
Chloride: 105 mmol/L (ref 98–111)
Creatinine, Ser: 0.53 mg/dL (ref 0.44–1.00)
GFR calc Af Amer: >60 mL/min (ref >60)
GFR calc non Af Amer: >60 mL/min (ref >60)
Glucose, Bld: 88 mg/dL (ref 70–99)
Potassium: 4.2 mmol/L (ref 3.5–5.1)
Sodium: 135 mmol/L (ref 135–145)
Total Bilirubin: 0.4 mg/dL (ref 0.3–1.2)
Total Protein: 5.8 g/dL — ABNORMAL LOW (ref 6.5–8.1)

## 2019-07-20 LAB — FETAL FIBRONECTIN: Fetal Fibronectin: NEGATIVE

## 2019-07-20 MED ORDER — FERROUS SULFATE 325 (65 FE) MG PO TABS: 325.0000 mg | ORAL_TABLET | Freq: Two times a day (BID) | ORAL | 1 refills | Status: DC

## 2019-07-20 MED ORDER — LACTATED RINGERS IV SOLN: Freq: Once | INTRAVENOUS | Status: AC

## 2019-07-20 NOTE — Discharge Instructions (Signed)
Dizziness Dizziness is a common problem. It is a feeling of unsteadiness or light-headedness. You may feel like you are about to faint. Dizziness can lead to injury if you stumble or fall. Anyone can become dizzy, but dizziness is more common in older adults. This condition can be caused by a number of things, including medicines, dehydration, or illness. Follow these instructions at home: Eating and drinking  Drink enough fluid to keep your urine clear or pale yellow. This helps to keep you from becoming dehydrated. Try to drink more clear fluids, such as water.  Do not drink alcohol.  Limit your caffeine intake if told to do so by your health care provider. Check ingredients and nutrition facts to see if a food or beverage contains caffeine.  Limit your salt (sodium) intake if told to do so by your health care provider. Check ingredients and nutrition facts to see if a food or beverage contains sodium. Activity  Avoid making quick movements. ? Rise slowly from chairs and steady yourself until you feel okay. ? In the morning, first sit up on the side of the bed. When you feel okay, stand slowly while you hold onto something until you know that your balance is fine.  If you need to stand in one place for a long time, move your legs often. Tighten and relax the muscles in your legs while you are standing.  Do not drive or use heavy machinery if you feel dizzy.  Avoid bending down if you feel dizzy. Place items in your home so that they are easy for you to reach without leaning over. Lifestyle  Do not use any products that contain nicotine or tobacco, such as cigarettes and e-cigarettes. If you need help quitting, ask your health care provider.  Try to reduce your stress level by using methods such as yoga or meditation. Talk with your health care provider if you need help to manage your stress. General instructions  Watch your dizziness for any changes.  Take over-the-counter and  prescription medicines only as told by your health care provider. Talk with your health care provider if you think that your dizziness is caused by a medicine that you are taking.  Tell a friend or a family member that you are feeling dizzy. If he or she notices any changes in your behavior, have this person call your health care provider.  Keep all follow-up visits as told by your health care provider. This is important. Contact a health care provider if:  Your dizziness does not go away.  Your dizziness or light-headedness gets worse.  You feel nauseous.  You have reduced hearing.  You have new symptoms.  You are unsteady on your feet or you feel like the room is spinning. Get help right away if:  You vomit or have diarrhea and are unable to eat or drink anything.  You have problems talking, walking, swallowing, or using your arms, hands, or legs.  You feel generally weak.  You are not thinking clearly or you have trouble forming sentences. It may take a friend or family member to notice this.  You have chest pain, abdominal pain, shortness of breath, or sweating.  Your vision changes.  You have any bleeding.  You have a severe headache.  You have neck pain or a stiff neck.  You have a fever. These symptoms may represent a serious problem that is an emergency. Do not wait to see if the symptoms will go away. Get medical help  right away. Call your local emergency services (911 in the [place] Do not drive yourself to the hospital. Summary  Dizziness is a feeling of unsteadiness or light-headedness. This condition can be caused by a number of things, including medicines, dehydration, or illness.  Anyone can become dizzy, but dizziness is more common in older adults.  Drink enough fluid to keep your urine clear or pale yellow. Do not drink alcohol.  Avoid making quick movements if you feel dizzy. Monitor your dizziness for any changes. This information is not intended to  replace advice given to you by your health care provider. Make sure you discuss any questions you have with your health care provider. Document Revised: 04/14/2017 Document Reviewed: 05/14/2016 Elsevier Patient Education  [place].   Dehydration, Adult Dehydration is a condition in which there is not enough water or other fluids in the body. This happens when a person loses more fluids than he or she takes in. Important organs, such as the kidneys, brain, and heart, cannot function without a proper amount of fluids. Any loss of fluids from the body can lead to dehydration. Dehydration can be mild, moderate, or severe. It should be treated right away to prevent it from becoming severe. What are the causes? Dehydration may be caused by:  Conditions that cause loss of water or other fluids, such as diarrhea, vomiting, or sweating or urinating a lot.  Not drinking enough fluids, especially when you are ill or doing activities that require a lot of energy.  Other illnesses and conditions, such as fever or infection.  Certain medicines, such as medicines that remove excess fluid from the body (diuretics).  Lack of safe drinking water.  Not being able to get enough water and food. What increases the risk? The following factors may make you more likely to develop this condition:  Having a long-term (chronic) illness that has not been treated properly, such as diabetes, heart disease, or kidney disease.  Being 96 years of age or older.  Having a disability.  Living in a place that is high in altitude, where thinner, drier air causes more fluid loss.  Doing exercises that put stress on your body for a long time (endurance sports). What are the signs or symptoms? Symptoms of dehydration depend on how severe it is. Mild or moderate dehydration  Thirst.  Dry lips or dry mouth.  Dizziness or light-headedness, especially when standing up from a seated position.  Muscle  cramps.  Dark urine. Urine may be the color of tea.  Less urine or tears produced than usual.  Headache. Severe dehydration  Changes in skin. Your skin may be cold and clammy, blotchy, or pale. Your skin also may not return to normal after being lightly pinched and released.  Little or no tears, urine, or sweat.  Changes in vital signs, such as rapid breathing and low blood pressure. Your pulse may be weak or may be faster than 100 beats a minute when you are sitting still.  Other changes, such as: ? Feeling very thirsty. ? Sunken eyes. ? Cold hands and feet. ? Confusion. ? Being very tired (lethargic) or having trouble waking from sleep. ? Short-term weight loss. ? Loss of consciousness. How is this diagnosed? This condition is diagnosed based on your symptoms and a physical exam. You may have blood and urine tests to help confirm the diagnosis. How is this treated? Treatment for this condition depends on how severe it is. Treatment should be started right away.  Do not wait until dehydration becomes severe. Severe dehydration is an emergency and needs to be treated in a hospital.  Mild or moderate dehydration can be treated at home. You may be asked to: ? Drink more fluids. ? Drink an oral rehydration solution (ORS). This drink helps restore proper amounts of fluids and salts and minerals in the blood (electrolytes).  Severe dehydration can be treated: ? With IV fluids. ? By correcting abnormal levels of electrolytes. This is often done by giving electrolytes through a tube that is passed through your nose and into your stomach (nasogastric tube, or NG tube). ? By treating the underlying cause of dehydration. Follow these instructions at home: Oral rehydration solution If told by your health care provider, drink an ORS:  Make an ORS by following instructions on the package.  Start by drinking small amounts, about  cup (120 mL) every 5-10 minutes.  Slowly increase how  much you drink until you have taken the amount recommended by your health care provider. Eating and drinking         Drink enough clear fluid to keep your urine pale yellow. If you were told to drink an ORS, finish the ORS first and then start slowly drinking other clear fluids. Drink fluids such as: ? Water. Do not drink only water. Doing that can lead to hyponatremia, which is having too little salt (sodium) in the body. ? Water from ice chips you suck on. ? Fruit juice that you have added water to (diluted fruit juice). ? Low-calorie sports drinks.  Eat foods that contain a healthy balance of electrolytes, such as bananas, oranges, potatoes, tomatoes, and spinach.  Do not drink alcohol.  Avoid the following: ? Drinks that contain a lot of sugar. These include high-calorie sports drinks, fruit juice that is not diluted, and soda. ? Caffeine. ? Foods that are greasy or contain a lot of fat or sugar. General instructions  Take over-the-counter and prescription medicines only as told by your health care provider.  Do not take sodium tablets. Doing that can lead to having too much sodium in the body (hypernatremia).  Return to your normal activities as told by your health care provider. Ask your health care provider what activities are safe for you.  Keep all follow-up visits as told by your health care provider. This is important. Contact a health care provider if:  You have muscle cramps, pain, or discomfort, such as: ? Pain in your abdomen and the pain gets worse or stays in one area (localizes). ? Stiff neck.  You have a rash.  You are more irritable than usual.  You are sleepier or have a harder time waking than usual.  You feel weak or dizzy.  You feel very thirsty. Get help right away if you have:  Any symptoms of severe dehydration.  Symptoms of vomiting, such as: ? You cannot eat or drink without vomiting. ? Vomiting gets worse or does not go away. ? Vomit  includes blood or green matter (bile).  Symptoms that get worse with treatment.  A fever.  A severe headache.  Problems with urination or bowel movements, such as: ? Diarrhea that gets worse or does not go away. ? Blood in your stool (feces). This may cause stool to look black and tarry. ? Not urinating, or urinating only a small amount of very dark urine, within 6-8 hours.  Trouble breathing. These symptoms may represent a serious problem that is an emergency. Do not wait to  see if the symptoms will go away. Get medical help right away. Call your local emergency services (911 in the [place] Do not drive yourself to the hospital. Summary  Dehydration is a condition in which there is not enough water or other fluids in the body. This happens when a person loses more fluids than he or she takes in.  Treatment for this condition depends on how severe it is. Treatment should be started right away. Do not wait until dehydration becomes severe.  Drink enough clear fluid to keep your urine pale yellow. If you were told to drink an oral rehydration solution (ORS), finish the ORS first and then start slowly drinking other clear fluids.  Take over-the-counter and prescription medicines only as told by your health care provider.  Get help right away if you have any symptoms of severe dehydration. This information is not intended to replace advice given to you by your health care provider. Make sure you discuss any questions you have with your health care provider. Document Revised: 11/22/2018 Document Reviewed: 11/22/2018 Elsevier Patient Education  [place].   Anemia  Anemia is a condition in which you do not have enough red blood cells or hemoglobin. Hemoglobin is a substance in red blood cells that carries oxygen. When you do not have enough red blood cells or hemoglobin (are anemic), your body cannot get enough oxygen and your organs may not work properly. As a result, you may feel  very tired or have other problems. What are the causes? Common causes of anemia include:  Excessive bleeding. Anemia can be caused by excessive bleeding inside or outside the body, including bleeding from the intestine or from periods in women.  Poor nutrition.  Long-lasting (chronic) kidney, thyroid, and liver disease.  Bone marrow disorders.  Cancer and treatments for cancer.  HIV (human immunodeficiency virus) and AIDS (acquired immunodeficiency syndrome).  Treatments for HIV and AIDS.  Spleen problems.  Blood disorders.  Infections, medicines, and autoimmune disorders that destroy red blood cells. What are the signs or symptoms? Symptoms of this condition include:  Minor weakness.  Dizziness.  Headache.  Feeling heartbeats that are irregular or faster than normal (palpitations).  Shortness of breath, especially with exercise.  Paleness.  Cold sensitivity.  Indigestion.  Nausea.  Difficulty sleeping.  Difficulty concentrating. Symptoms may occur suddenly or develop slowly. If your anemia is mild, you may not have symptoms. How is this diagnosed? This condition is diagnosed based on:  Blood tests.  Your medical history.  A physical exam.  Bone marrow biopsy. Your health care provider may also check your stool (feces) for blood and may do additional testing to look for the cause of your bleeding. You may also have other tests, including:  Imaging tests, such as a CT scan or MRI.  Endoscopy.  Colonoscopy. How is this treated? Treatment for this condition depends on the cause. If you continue to lose a lot of blood, you may need to be treated at a hospital. Treatment may include:  Taking supplements of iron, vitamin T73, or folic acid.  Taking a hormone medicine (erythropoietin) that can help to stimulate red blood cell growth.  Having a blood transfusion. This may be needed if you lose a lot of blood.  Making changes to your diet.  Having  surgery to remove your spleen. Follow these instructions at home:  Take over-the-counter and prescription medicines only as told by your health care provider.  Take supplements only as told by your  health care provider.  Follow any diet instructions that you were given.  Keep all follow-up visits as told by your health care provider. This is important. Contact a health care provider if:  You develop new bleeding anywhere in the body. Get help right away if:  You are very weak.  You are short of breath.  You have pain in your abdomen or chest.  You are dizzy or feel faint.  You have trouble concentrating.  You have bloody or black, tarry stools.  You vomit repeatedly or you vomit up blood. Summary  Anemia is a condition in which you do not have enough red blood cells or enough of a substance in your red blood cells that carries oxygen (hemoglobin).  Symptoms may occur suddenly or develop slowly.  If your anemia is mild, you may not have symptoms.  This condition is diagnosed with blood tests as well as a medical history and physical exam. Other tests may be needed.  Treatment for this condition depends on the cause of the anemia. This information is not intended to replace advice given to you by your health care provider. Make sure you discuss any questions you have with your health care provider. Document Revised: 03/24/2017 Document Reviewed: 05/13/2016 Elsevier Patient Education  [place].

## 2019-07-20 NOTE — MAU Provider Note (Signed)
Chief Complaint:  Abdominal Pain and Dizziness   First Provider Initiated Contact with Patient 07/20/19 1045     HPI: Carol Hendrix is a 19 y.o. G2P1001 at 28w3dwho presents to maternity admissions reporting dizziness and feelings of palpitations in her chest/abdomen at times. Later in the visit, she told RN that she has had dizziness all of her life.  States has been having intermittent painful contractions for 1-2 weeks.   States has been eating and drinking well. She reports good fetal movement, denies LOF, vaginal bleeding, vaginal itching/burning, urinary symptoms, h/a, n/v, diarrhea, constipation or fever/chills.    Dizziness This is a new problem. The current episode started today. The problem occurs constantly. The problem has been unchanged. Associated symptoms include abdominal pain (states is tightening) and nausea. Pertinent negatives include no chills, congestion, coughing, fever, headaches, myalgias, sore throat, urinary symptoms, vertigo (but feels lightheaded), visual change or vomiting. Nothing aggravates the symptoms. She has tried drinking and eating for the symptoms. The treatment provided no relief.  Abdominal Pain This is a new problem. The current episode started 1 to 4 weeks ago. The onset quality is gradual. The problem occurs intermittently. The problem has been unchanged. The pain is located in the suprapubic region, LLQ and RLQ. The pain is moderate. The quality of the pain is cramping. The abdominal pain does not radiate. Associated symptoms include nausea. Pertinent negatives include no constipation, diarrhea, dysuria, fever, frequency, headaches, myalgias or vomiting. Nothing aggravates the pain. The pain is relieved by nothing. She has tried nothing for the symptoms.   RN note: Carol Hendrix is a 19 y.o. at [redacted]w[redacted]d here in MAU reporting: last week for 3 days her stomach felt tight and felt like she could feel her baby's heartbeat through her belly. Was advised to come in but  pt did not feel comfortable due to covid. That feeling went away and then this morning at work she started feeling light headed and got the same pounding feeling in her abdomen. Pt states she has been eating and drinking well today, states she is supposed to be taking iron pills for borderline anemia but has not been. States the tightness is painful. No LOF, no bleeding. +FM Onset of complaint: today Pain score: 8/10  Past Medical History: Past Medical History:  Diagnosis Date  . Anemia   . Asthma     Past obstetric history: OB History  Gravida Para Term Preterm AB Living  2 1 1  0 0 1  SAB TAB Ectopic Multiple Live Births  0 0 0 0 1    # Outcome Date GA Lbr Len/2nd Weight Sex Delivery Anes PTL Lv  2 Current           1 Term 08/01/18 [redacted]w[redacted]d 02:18 / 00:31 2999 g F Vag-Spont EPI  LIV     Birth Comments: wnl    Past Surgical History: Past Surgical History:  Procedure Laterality Date  . NO PAST SURGERIES      Family History: Family History  Problem Relation Age of Onset  . Healthy Mother   . Healthy Father     Social History: Social History   Tobacco Use  . Smoking status: Former Smoker    Packs/day: 0.25    Types: Cigarettes  . Smokeless tobacco: Never Used  . Tobacco comment: rolls marijuana in with tobacco  Substance Use Topics  . Alcohol use: Yes    Comment: social   . Drug use: Yes    Types: Marijuana  Comment: last smoked 03/19/2019    Allergies: No Known Allergies  Meds:  Medications Prior to Admission  Medication Sig Dispense Refill Last Dose  . albuterol (PROVENTIL HFA;VENTOLIN HFA) 108 (90 Base) MCG/ACT inhaler Inhale 1-2 puffs into the lungs every 6 (six) hours as needed for wheezing or shortness of breath. 1 Inhaler 0 Past Week at Unknown time  . fluticasone (FLONASE) 50 MCG/ACT nasal spray Place 2 sprays into both nostrils daily. 1 g 0 Past Month at Unknown time  . ondansetron (ZOFRAN ODT) 4 MG disintegrating tablet Take 1 tablet (4 mg total) by  mouth every 8 (eight) hours as needed for nausea or vomiting. 20 tablet 0 Past Month at Unknown time  . albuterol (PROVENTIL) (2.5 MG/3ML) 0.083% nebulizer solution Take 3 mLs (2.5 mg total) by nebulization every 6 (six) hours as needed for wheezing or shortness of breath. 75 mL 0 More than a month at Unknown time  . cetirizine (ZYRTEC) 10 MG tablet Take 1 tablet (10 mg total) by mouth daily. 30 tablet 11 More than a month at Unknown time  . metoCLOPramide (REGLAN) 10 MG tablet Take 1 tablet (10 mg total) by mouth every 8 (eight) hours as needed for nausea. 30 tablet 0 More than a month at Unknown time    I have reviewed patient's Past Medical Hx, Surgical Hx, Family Hx, Social Hx, medications and allergies.   ROS:  Review of Systems  Constitutional: Negative for chills and fever.  HENT: Negative for congestion and sore throat.   Respiratory: Negative for cough.   Gastrointestinal: Positive for abdominal pain (states is tightening) and nausea. Negative for constipation, diarrhea and vomiting.  Genitourinary: Negative for dysuria and frequency.  Musculoskeletal: Negative for myalgias.  Neurological: Positive for dizziness. Negative for vertigo (but feels lightheaded) and headaches.   Other systems negative  Physical Exam   Patient Vitals for the past 24 hrs:  BP Temp Temp src Pulse Resp SpO2 Height Weight  07/20/19 1040 - - - - - 98 % - -  07/20/19 1035 - - - - - 98 % - -  07/20/19 1031 101/63 - - 95 - - - -  07/20/19 1030 - - - - - 99 % - -  07/20/19 1026 (!) 97/53 - - 93 16 - - -  07/20/19 1024 - 98.5 F (36.9 C) Oral - - - - -  07/20/19 1009 101/62 98.5 F (36.9 C) Oral 93 16 99 % - -  07/20/19 1005 - - - - - - 5\' 5"  (1.651 m) 68.7 kg                             07/20/19 1048  -  94  -  -  92/58  -  -  -  -  -  - RF   07/20/19 1047  -  74  -  -  102/58   -  -  -  -  -  - RF   07/20/19 1045  -  72  -  -  96/60  Orthostatic Vitals                Constitutional:  Well-developed, well-nourished female in no acute distress.  Cardiovascular: normal rate and rhythm Respiratory: normal effort, clear to auscultation bilaterally GI: Abd soft, non-tender, gravid appropriate for gestational age.   No rebound or guarding. MS: Extremities nontender, no edema, normal ROM Neurologic: Alert and oriented  x 4.  GU: Neg CVAT.  PELVIC EXAM:   Dilation: 1 Effacement (%): 50 Cervical Position: Posterior Station: Ballotable Presentation: Vertex Exam by:: Mayford Knife CNM  FHT:  Baseline 130 , moderate variability, accelerations present, no decelerations Contractions: Uterine irritability   Labs: Results for orders placed or performed during the hospital encounter of 07/20/19 (from the past 24 hour(s))  CBC with Differential/Platelet     Status: Abnormal   Collection Time: 07/20/19 11:37 AM  Result Value Ref Range   WBC 8.6 4.0 - 10.5 K/uL   RBC 3.65 (L) 3.87 - 5.11 MIL/uL   Hemoglobin 9.0 (L) 12.0 - 15.0 g/dL   HCT 14.4 (L) 81.8 - 56.3 %   MCV 78.9 (L) 80.0 - 100.0 fL   MCH 24.7 (L) 26.0 - 34.0 pg   MCHC 31.3 30.0 - 36.0 g/dL   RDW 14.9 70.2 - 63.7 %   Platelets 230 150 - 400 K/uL   nRBC 0.0 0.0 - 0.2 %   Neutrophils Relative % 71 %   Neutro Abs 6.2 1.7 - 7.7 K/uL   Lymphocytes Relative 20 %   Lymphs Abs 1.7 0.7 - 4.0 K/uL   Monocytes Relative 7 %   Monocytes Absolute 0.6 0.1 - 1.0 K/uL   Eosinophils Relative 1 %   Eosinophils Absolute 0.1 0.0 - 0.5 K/uL   Basophils Relative 0 %   Basophils Absolute 0.0 0.0 - 0.1 K/uL   Immature Granulocytes 1 %   Abs Immature Granulocytes 0.05 0.00 - 0.07 K/uL  Comprehensive metabolic panel     Status: Abnormal   Collection Time: 07/20/19 11:37 AM  Result Value Ref Range   Sodium 135 135 - 145 mmol/L   Potassium 4.2 3.5 - 5.1 mmol/L   Chloride 105 98 - 111 mmol/L   CO2 20 (L) 22 - 32 mmol/L   Glucose, Bld 88 70 - 99 mg/dL   BUN 9 6 - 20 mg/dL   Creatinine, Ser 8.58 0.44 - 1.00 mg/dL   Calcium 8.7 (L) 8.9 - 10.3  mg/dL   Total Protein 5.8 (L) 6.5 - 8.1 g/dL   Albumin 2.8 (L) 3.5 - 5.0 g/dL   AST 17 15 - 41 U/L   ALT 10 0 - 44 U/L   Alkaline Phosphatase 99 38 - 126 U/L   Total Bilirubin 0.4 0.3 - 1.2 mg/dL   GFR calc non Af Amer >60 >60 mL/min   GFR calc Af Amer >60 >60 mL/min   Anion gap 10 5 - 15  Fetal fibronectin     Status: None   Collection Time: 07/20/19 11:37 AM  Result Value Ref Range   Fetal Fibronectin NEGATIVE NEGATIVE  Urinalysis, Routine w reflex microscopic     Status: Abnormal   Collection Time: 07/20/19 12:14 PM  Result Value Ref Range   Color, Urine YELLOW YELLOW   APPearance CLEAR CLEAR   Specific Gravity, Urine 1.026 1.005 - 1.030   pH 6.0 5.0 - 8.0   Glucose, UA NEGATIVE NEGATIVE mg/dL   Hgb urine dipstick NEGATIVE NEGATIVE   Bilirubin Urine NEGATIVE NEGATIVE   Ketones, ur NEGATIVE NEGATIVE mg/dL   Protein, ur 30 (A) NEGATIVE mg/dL   Nitrite NEGATIVE NEGATIVE   Leukocytes,Ua NEGATIVE NEGATIVE   RBC / HPF 0-5 0 - 5 RBC/hpf   WBC, UA 0-5 0 - 5 WBC/hpf   Bacteria, UA NONE SEEN NONE SEEN   Squamous Epithelial / LPF 0-5 0 - 5   Mucus PRESENT     --/--/  O POS, O POS (04/07 1400)  Imaging:  No results found.  MAU Course/MDM: I have ordered labs and reviewed results. Anemia is stable.  UA showed dehydration.  NST reviewed, noted irritability Fetal fibronectin collected   This resulted as negative  Will give her some IV hydration for dizziness and irritabiltiy  After two liters, the dizziness improved as did the uterine irritability.    Assessment: Single IUP at [redacted]w[redacted]d Uterine irritability Dehydration Dizziness  Plan: Discharge home Rx Ferrous Sulfate bid for anemia Advised on PO hydration and regular meals Followup with office PTL precautions Encouraged to return here or to other Urgent Care/ED if she develops worsening of symptoms, increase in pain, fever, or other concerning symptoms.      Wynelle Bourgeois CNM, MSN Certified  Nurse-Midwife 07/20/2019 10:45 AM

## 2019-07-20 NOTE — MAU Note (Addendum)
Carol Hendrix is a 19 y.o. at [redacted]w[redacted]d here in MAU reporting: last week for 3 days her stomach felt tight and felt like she could feel her baby's heartbeat through her belly. Was advised to come in but pt did not feel comfortable due to covid. That feeling went away and then this morning at work she started feeling light headed and got the same pounding feeling in her abdomen. Pt states she has been eating and drinking well today, states she is supposed to be taking iron pills for borderline anemia but has not been. States the tightness is painful. No LOF, no bleeding. +FM  Onset of complaint: today  Pain score: 8/10  Vitals:   07/20/19 1009  BP: 101/62  Pulse: 93  Resp: 16  Temp: 98.5 F (36.9 C)  SpO2: 99%     FHT: +FM  Lab orders placed from triage: UA, unable to give sample at this time

## 2019-07-23 ENCOUNTER — Encounter (HOSPITAL_COMMUNITY): Payer: Self-pay | Admitting: Obstetrics and Gynecology

## 2019-07-23 ENCOUNTER — Inpatient Hospital Stay (HOSPITAL_COMMUNITY)
Admission: AD | Admit: 2019-07-23 | Discharge: 2019-07-24 | Disposition: A | Discharge status: Home / Self Care | Payer: Medicaid Other | Attending: Obstetrics and Gynecology | Admitting: Obstetrics and Gynecology

## 2019-07-23 ENCOUNTER — Other Ambulatory Visit: Payer: Self-pay

## 2019-07-23 DIAGNOSIS — Y93E9 Activity, other interior property and clothing maintenance: Secondary | ICD-10-CM | POA: Insufficient documentation

## 2019-07-23 DIAGNOSIS — O26893 Other specified pregnancy related conditions, third trimester: Secondary | ICD-10-CM | POA: Insufficient documentation

## 2019-07-23 DIAGNOSIS — Y92002 Bathroom of unspecified non-institutional (private) residence single-family (private) house as the place of occurrence of the external cause: Secondary | ICD-10-CM | POA: Insufficient documentation

## 2019-07-23 DIAGNOSIS — Z3A34 34 weeks gestation of pregnancy: Secondary | ICD-10-CM | POA: Insufficient documentation

## 2019-07-23 DIAGNOSIS — R109 Unspecified abdominal pain: Secondary | ICD-10-CM | POA: Insufficient documentation

## 2019-07-23 DIAGNOSIS — W182XXA Fall in (into) shower or empty bathtub, initial encounter: Secondary | ICD-10-CM | POA: Insufficient documentation

## 2019-07-23 DIAGNOSIS — N858 Other specified noninflammatory disorders of uterus: Secondary | ICD-10-CM

## 2019-07-23 DIAGNOSIS — N859 Noninflammatory disorder of uterus, unspecified: Secondary | ICD-10-CM

## 2019-07-23 DIAGNOSIS — W19XXXA Unspecified fall, initial encounter: Secondary | ICD-10-CM

## 2019-07-23 DIAGNOSIS — Z87891 Personal history of nicotine dependence: Secondary | ICD-10-CM | POA: Insufficient documentation

## 2019-07-23 NOTE — MAU Provider Note (Signed)
Chief Complaint:  Abdominal Pain   First Provider Initiated Contact with Patient 07/23/19 2232     HPI  HPI: Carol Hendrix is a 19 y.o. G2P1001 at 36w6dwho presents to maternity admissions reporting having falling into bathtub while cleaning.  Was leaning over it, and fell forward, lightly striking abdomen, from a few inches.  Has some cramping.  Was seen here 3 days ago for cramping, with a negative fetal fibronectin that night.  Cervix was 1/50%/B.Marland Kitchen She reports good fetal movement, denies LOF, vaginal bleeding, vaginal itching/burning, urinary symptoms, h/a, dizziness, n/v, diarrhea, constipation or fever/chills.  She denies headache, visual changes or RUQ abdominal pain.  RN Note: PT SAYS SHE HAS BEEN HAVING ABD X1 WEEK AND WAS IN MAU ON SAT .    SHE FELL AT A HOUSE AT 4 PM-TODAY AND  WAS LEANING OVER A TUB - FEET SLIPPED AND SHE FELL  ON TUB THEN  WENT TO DR OFFICE AT 430PM  WAS TOLD TO COME HERE    Past Medical History: Past Medical History:  Diagnosis Date  . Anemia   . Asthma     Past obstetric history: OB History  Gravida Para Term Preterm AB Living  2 1 1  0 0 1  SAB TAB Ectopic Multiple Live Births  0 0 0 0 1    # Outcome Date GA Lbr Len/2nd Weight Sex Delivery Anes PTL Lv  2 Current           1 Term 08/01/18 [redacted]w[redacted]d 02:18 / 00:31 2999 g F Vag-Spont EPI  LIV     Birth Comments: wnl    Past Surgical History: Past Surgical History:  Procedure Laterality Date  . NO PAST SURGERIES      Family History: Family History  Problem Relation Age of Onset  . Healthy Mother   . Healthy Father     Social History: Social History   Tobacco Use  . Smoking status: Former Smoker    Packs/day: 0.25    Types: Cigarettes  . Smokeless tobacco: Never Used  . Tobacco comment: rolls marijuana in with tobacco  Substance Use Topics  . Alcohol use: Yes    Comment: social   . Drug use: Yes    Types: Marijuana    Comment: last smoked 03/19/2019    Allergies: No Known  Allergies  Meds:  Medications Prior to Admission  Medication Sig Dispense Refill Last Dose  . albuterol (PROVENTIL HFA;VENTOLIN HFA) 108 (90 Base) MCG/ACT inhaler Inhale 1-2 puffs into the lungs every 6 (six) hours as needed for wheezing or shortness of breath. 1 Inhaler 0 Past Month at Unknown time  . albuterol (PROVENTIL) (2.5 MG/3ML) 0.083% nebulizer solution Take 3 mLs (2.5 mg total) by nebulization every 6 (six) hours as needed for wheezing or shortness of breath. 75 mL 0   . cetirizine (ZYRTEC) 10 MG tablet Take 1 tablet (10 mg total) by mouth daily. 30 tablet 11   . ferrous sulfate (FERROUSUL) 325 (65 FE) MG tablet Take 1 tablet (325 mg total) by mouth 2 (two) times daily. 60 tablet 1   . fluticasone (FLONASE) 50 MCG/ACT nasal spray Place 2 sprays into both nostrils daily. 1 g 0   . metoCLOPramide (REGLAN) 10 MG tablet Take 1 tablet (10 mg total) by mouth every 8 (eight) hours as needed for nausea. 30 tablet 0   . ondansetron (ZOFRAN ODT) 4 MG disintegrating tablet Take 1 tablet (4 mg total) by mouth every 8 (eight) hours as needed  for nausea or vomiting. 20 tablet 0     I have reviewed patient's Past Medical Hx, Surgical Hx, Family Hx, Social Hx, medications and allergies.   ROS:  Review of Systems  Constitutional: Negative for chills, fatigue and fever.  Respiratory: Negative for shortness of breath.   Gastrointestinal: Negative for abdominal pain (some tightening), constipation, diarrhea and nausea.  Genitourinary: Positive for pelvic pain (tightening). Negative for dysuria and vaginal bleeding.  Musculoskeletal: Negative for back pain.  Neurological: Negative for dizziness.   Other systems negative  Physical Exam   Patient Vitals for the past 24 hrs:  BP Temp Temp src Pulse Resp Height Weight  07/23/19 2149 113/65 -- -- 89 16 -- --  07/23/19 2136 111/68 98.6 F (37 C) Oral 89 20 5\' 5"  (1.651 m) 68.7 kg   Constitutional: Well-developed, well-nourished female in no acute  distress.  Cardiovascular: normal rate and rhythm Respiratory: normal effort, clear to auscultation bilaterally GI: Abd soft, non-tender, gravid appropriate for gestational age.   No rebound or guarding. MS: Extremities nontender, no edema, normal ROM Neurologic: Alert and oriented x 4.  GU: Neg CVAT.  PELVIC EXAM:  Cervix 1/50/balltotable (no change from 3 days ago  FHT:  Baseline 130 , moderate variability, accelerations present, no decelerations Contractions: occasional, irregular, mild   Labs: No results found for this or any previous visit (from the past 24 hour(s)).  --/--/O POS, O POS (04/07 1400)  Imaging:  No results found.  MAU Course/MDM: Monitored for 2 hours (fall happened many hours ago) NST reviewed, reassuring throughout.   Treatments in MAU included EFM.    Assessment: Single IUP at [redacted]w[redacted]d S/P fall of a few inches onto tub Intermittent uterine cramping, no change in cervix  Plan: Discharge home Preterm Labor precautions and fetal kick counts Follow up in Office for prenatal visits   Encouraged to return here or to other Urgent Care/ED if she develops worsening of symptoms, increase in pain, fever, or other concerning symptoms.   Pt stable at time of discharge.  [redacted]w[redacted]d CNM, MSN Certified Nurse-Midwife 07/23/2019 10:32 PM

## 2019-07-23 NOTE — MAU Note (Signed)
PT SAYS SHE HAS BEEN HAVING ABD X1 WEEK AND WAS IN MAU ON SAT .    SHE FELL AT A HOUSE AT 4 PM-TODAY AND  WAS LEANING OVER A TUB - FEET SLIPPED AND SHE FELL  ON TUB THEN  WENT TO DR OFFICE AT 430PM  WAS TOLD TO COME HERE .

## 2019-07-24 DIAGNOSIS — O9A213 Injury, poisoning and certain other consequences of external causes complicating pregnancy, third trimester: Secondary | ICD-10-CM | POA: Diagnosis not present

## 2019-07-24 DIAGNOSIS — W182XXA Fall in (into) shower or empty bathtub, initial encounter: Secondary | ICD-10-CM | POA: Diagnosis not present

## 2019-07-24 DIAGNOSIS — Z87891 Personal history of nicotine dependence: Secondary | ICD-10-CM | POA: Diagnosis not present

## 2019-07-24 DIAGNOSIS — Y92002 Bathroom of unspecified non-institutional (private) residence single-family (private) house as the place of occurrence of the external cause: Secondary | ICD-10-CM | POA: Diagnosis not present

## 2019-07-24 DIAGNOSIS — Y93E9 Activity, other interior property and clothing maintenance: Secondary | ICD-10-CM | POA: Diagnosis not present

## 2019-07-24 DIAGNOSIS — Z3A34 34 weeks gestation of pregnancy: Secondary | ICD-10-CM

## 2019-07-24 DIAGNOSIS — R109 Unspecified abdominal pain: Secondary | ICD-10-CM | POA: Diagnosis present

## 2019-07-24 DIAGNOSIS — O26893 Other specified pregnancy related conditions, third trimester: Secondary | ICD-10-CM | POA: Diagnosis not present

## 2019-07-24 NOTE — Discharge Instructions (Signed)

## 2019-08-06 DIAGNOSIS — Z3480 Encounter for supervision of other normal pregnancy, unspecified trimester: Secondary | ICD-10-CM | POA: Diagnosis not present

## 2019-08-06 LAB — OB RESULTS CONSOLE GBS: GBS: POSITIVE

## 2019-08-08 ENCOUNTER — Other Ambulatory Visit: Payer: Self-pay | Admitting: Obstetrics and Gynecology

## 2019-08-13 ENCOUNTER — Encounter (HOSPITAL_COMMUNITY): Payer: Self-pay | Admitting: *Deleted

## 2019-08-13 ENCOUNTER — Telehealth (HOSPITAL_COMMUNITY): Payer: Self-pay | Admitting: *Deleted

## 2019-08-13 NOTE — Telephone Encounter (Signed)
Preadmission screen  

## 2019-08-19 ENCOUNTER — Other Ambulatory Visit (HOSPITAL_COMMUNITY)
Admission: RE | Admit: 2019-08-19 | Discharge: 2019-08-19 | Disposition: A | Discharge status: Home / Self Care | Payer: Medicaid Other | Source: Ambulatory Visit | Attending: Obstetrics and Gynecology | Admitting: Obstetrics and Gynecology

## 2019-08-19 DIAGNOSIS — Z01812 Encounter for preprocedural laboratory examination: Secondary | ICD-10-CM | POA: Insufficient documentation

## 2019-08-19 DIAGNOSIS — Z20822 Contact with and (suspected) exposure to covid-19: Secondary | ICD-10-CM | POA: Insufficient documentation

## 2019-08-19 LAB — SARS CORONAVIRUS 2 (TAT 6-24 HRS): SARS Coronavirus 2: NEGATIVE

## 2019-08-21 ENCOUNTER — Inpatient Hospital Stay (HOSPITAL_COMMUNITY): Payer: Medicaid Other

## 2019-08-21 ENCOUNTER — Inpatient Hospital Stay (HOSPITAL_COMMUNITY): Payer: Medicaid Other | Admitting: Anesthesiology

## 2019-08-21 ENCOUNTER — Encounter (HOSPITAL_COMMUNITY): Payer: Self-pay | Admitting: Obstetrics and Gynecology

## 2019-08-21 ENCOUNTER — Inpatient Hospital Stay (HOSPITAL_COMMUNITY)
Admission: AD | Admit: 2019-08-21 | Discharge: 2019-08-22 | DRG: 798 | Disposition: A | Discharge status: Home / Self Care | Payer: Medicaid Other | Attending: Obstetrics and Gynecology | Admitting: Obstetrics and Gynecology

## 2019-08-21 DIAGNOSIS — Z87891 Personal history of nicotine dependence: Secondary | ICD-10-CM | POA: Diagnosis not present

## 2019-08-21 DIAGNOSIS — Z3A37 37 weeks gestation of pregnancy: Secondary | ICD-10-CM | POA: Diagnosis not present

## 2019-08-21 DIAGNOSIS — Z3A39 39 weeks gestation of pregnancy: Secondary | ICD-10-CM

## 2019-08-21 DIAGNOSIS — O99824 Streptococcus B carrier state complicating childbirth: Principal | ICD-10-CM | POA: Diagnosis present

## 2019-08-21 DIAGNOSIS — O26893 Other specified pregnancy related conditions, third trimester: Secondary | ICD-10-CM | POA: Diagnosis not present

## 2019-08-21 LAB — CBC
HCT: 29.2 % — ABNORMAL LOW (ref 36.0–46.0)
Hemoglobin: 9 g/dL — ABNORMAL LOW (ref 12.0–15.0)
MCH: 23.7 pg — ABNORMAL LOW (ref 26.0–34.0)
MCHC: 30.8 g/dL (ref 30.0–36.0)
MCV: 76.8 fL — ABNORMAL LOW (ref 80.0–100.0)
Platelets: 196 10*3/uL (ref 150–400)
RBC: 3.8 MIL/uL — ABNORMAL LOW (ref 3.87–5.11)
RDW: 15.7 % — ABNORMAL HIGH (ref 11.5–15.5)
WBC: 9.5 10*3/uL (ref 4.0–10.5)
nRBC: 0 % (ref 0.0–0.2)

## 2019-08-21 LAB — TYPE AND SCREEN
ABO/RH(D): O POS
Antibody Screen: NEGATIVE

## 2019-08-21 LAB — RPR: RPR Ser Ql: NONREACTIVE

## 2019-08-21 MED ORDER — DIBUCAINE (PERIANAL) 1 % EX OINT: 1.0000 "application " | TOPICAL_OINTMENT | CUTANEOUS | Status: DC | PRN

## 2019-08-21 MED ORDER — ACETAMINOPHEN 325 MG PO TABS: 650.0000 mg | ORAL_TABLET | ORAL | Status: DC | PRN

## 2019-08-21 MED ORDER — SODIUM CHLORIDE 0.9 % IV SOLN
5.0000 10*6.[IU] | Freq: Once | INTRAVENOUS | Status: AC
  Administered 2019-08-21: 08:00:00 5 10*6.[IU] via INTRAVENOUS
  Filled 2019-08-21: qty 5

## 2019-08-21 MED ORDER — TERBUTALINE SULFATE 1 MG/ML IJ SOLN: 0.2500 mg | Freq: Once | INTRAMUSCULAR | Status: DC | PRN

## 2019-08-21 MED ORDER — METHYLERGONOVINE MALEATE 0.2 MG PO TABS: 0.2000 mg | ORAL_TABLET | ORAL | Status: DC | PRN

## 2019-08-21 MED ORDER — OXYCODONE HCL 5 MG PO TABS: 10.0000 mg | ORAL_TABLET | ORAL | Status: DC | PRN

## 2019-08-21 MED ORDER — METHYLERGONOVINE MALEATE 0.2 MG PO TABS
0.2000 mg | ORAL_TABLET | Freq: Once | ORAL | Status: AC
  Administered 2019-08-21: 15:00:00 0.2 mg via ORAL
  Filled 2019-08-21: qty 1

## 2019-08-21 MED ORDER — LACTATED RINGERS IV SOLN: INTRAVENOUS | Status: DC

## 2019-08-21 MED ORDER — SENNOSIDES-DOCUSATE SODIUM 8.6-50 MG PO TABS
2.0000 | ORAL_TABLET | ORAL | Status: DC
  Administered 2019-08-21: 23:00:00 2 via ORAL
  Filled 2019-08-21: qty 2

## 2019-08-21 MED ORDER — ONDANSETRON HCL 4 MG PO TABS: 4.0000 mg | ORAL_TABLET | ORAL | Status: DC | PRN

## 2019-08-21 MED ORDER — LIDOCAINE HCL (PF) 1 % IJ SOLN: 30.0000 mL | INTRAMUSCULAR | Status: DC | PRN

## 2019-08-21 MED ORDER — OXYTOCIN BOLUS FROM INFUSION: 500.0000 mL | Freq: Once | INTRAVENOUS | Status: DC

## 2019-08-21 MED ORDER — ONDANSETRON HCL 4 MG/2ML IJ SOLN: 4.0000 mg | INTRAMUSCULAR | Status: DC | PRN

## 2019-08-21 MED ORDER — COCONUT OIL OIL: 1.0000 "application " | TOPICAL_OIL | Status: DC | PRN

## 2019-08-21 MED ORDER — PRENATAL MULTIVITAMIN CH
1.0000 | ORAL_TABLET | Freq: Every day | ORAL | Status: DC
  Administered 2019-08-22: 12:00:00 1 via ORAL
  Filled 2019-08-21: qty 1

## 2019-08-21 MED ORDER — METHYLERGONOVINE MALEATE 0.2 MG/ML IJ SOLN: 0.2000 mg | INTRAMUSCULAR | Status: DC | PRN

## 2019-08-21 MED ORDER — SIMETHICONE 80 MG PO CHEW: 80.0000 mg | CHEWABLE_TABLET | ORAL | Status: DC | PRN

## 2019-08-21 MED ORDER — MISOPROSTOL 25 MCG QUARTER TABLET: 25.0000 ug | ORAL_TABLET | ORAL | Status: DC | PRN

## 2019-08-21 MED ORDER — OXYTOCIN 40 UNITS IN NORMAL SALINE INFUSION - SIMPLE MED
1.0000 m[IU]/min | INTRAVENOUS | Status: DC
  Administered 2019-08-21: 08:00:00 2 m[IU]/min via INTRAVENOUS

## 2019-08-21 MED ORDER — OXYCODONE HCL 5 MG PO TABS: 5.0000 mg | ORAL_TABLET | ORAL | Status: DC | PRN

## 2019-08-21 MED ORDER — SOD CITRATE-CITRIC ACID 500-334 MG/5ML PO SOLN: 30.0000 mL | ORAL | Status: DC | PRN

## 2019-08-21 MED ORDER — OXYTOCIN 40 UNITS IN NORMAL SALINE INFUSION - SIMPLE MED
2.5000 [IU]/h | INTRAVENOUS | Status: DC
  Filled 2019-08-21: qty 1000

## 2019-08-21 MED ORDER — ZOLPIDEM TARTRATE 5 MG PO TABS: 5.0000 mg | ORAL_TABLET | Freq: Every evening | ORAL | Status: DC | PRN

## 2019-08-21 MED ORDER — PENICILLIN G POT IN DEXTROSE 60000 UNIT/ML IV SOLN
3.0000 10*6.[IU] | INTRAVENOUS | Status: DC
  Administered 2019-08-21: 13:00:00 3 10*6.[IU] via INTRAVENOUS
  Filled 2019-08-21: qty 50

## 2019-08-21 MED ORDER — WITCH HAZEL-GLYCERIN EX PADS: 1.0000 "application " | MEDICATED_PAD | CUTANEOUS | Status: DC | PRN

## 2019-08-21 MED ORDER — DIPHENHYDRAMINE HCL 25 MG PO CAPS: 25.0000 mg | ORAL_CAPSULE | Freq: Four times a day (QID) | ORAL | Status: DC | PRN

## 2019-08-21 MED ORDER — TETANUS-DIPHTH-ACELL PERTUSSIS 5-2.5-18.5 LF-MCG/0.5 IM SUSP: 0.5000 mL | Freq: Once | INTRAMUSCULAR | Status: DC

## 2019-08-21 MED ORDER — LACTATED RINGERS IV SOLN
500.0000 mL | INTRAVENOUS | Status: DC | PRN
  Administered 2019-08-21: 14:00:00 500 mL via INTRAVENOUS

## 2019-08-21 MED ORDER — BENZOCAINE-MENTHOL 20-0.5 % EX AERO: 1.0000 "application " | INHALATION_SPRAY | CUTANEOUS | Status: DC | PRN

## 2019-08-21 MED ORDER — FENTANYL CITRATE (PF) 100 MCG/2ML IJ SOLN: 100.0000 ug | INTRAMUSCULAR | Status: DC | PRN

## 2019-08-21 MED ORDER — FENTANYL-BUPIVACAINE-NACL 0.5-0.125-0.9 MG/250ML-% EP SOLN
EPIDURAL | Status: AC
  Filled 2019-08-21: qty 250

## 2019-08-21 MED ORDER — ONDANSETRON HCL 4 MG/2ML IJ SOLN
4.0000 mg | Freq: Four times a day (QID) | INTRAMUSCULAR | Status: DC | PRN
  Administered 2019-08-21: 08:00:00 4 mg via INTRAVENOUS
  Filled 2019-08-21: qty 2

## 2019-08-21 MED ORDER — FENTANYL CITRATE (PF) 100 MCG/2ML IJ SOLN
50.0000 ug | INTRAMUSCULAR | Status: DC | PRN
  Filled 2019-08-21: qty 2

## 2019-08-21 MED ORDER — IBUPROFEN 600 MG PO TABS
600.0000 mg | ORAL_TABLET | Freq: Four times a day (QID) | ORAL | Status: DC
  Administered 2019-08-21 – 2019-08-22 (×4): 600 mg via ORAL
  Filled 2019-08-21 (×4): qty 1

## 2019-08-21 NOTE — Anesthesia Preprocedure Evaluation (Addendum)
Anesthesia Evaluation  Patient identified by MRN, date of birth, ID band Patient awake    Reviewed: Allergy & Precautions, NPO status , Patient's Chart, lab work & pertinent test results  Airway Mallampati: II  TM Distance: >3 FB Neck ROM: Full    Dental no notable dental hx. (+) Teeth Intact   Pulmonary asthma , former smoker,    Pulmonary exam normal breath sounds clear to auscultation       Cardiovascular Exercise Tolerance: Good negative cardio ROS Normal cardiovascular exam Rhythm:Regular Rate:Normal     Neuro/Psych negative neurological ROS     GI/Hepatic negative GI ROS, Neg liver ROS,   Endo/Other    Renal/GU      Musculoskeletal   Abdominal   Peds  Hematology  (+) anemia , Hgb 10.0 Pl t206   Anesthesia Other Findings   Reproductive/Obstetrics (+) Pregnancy                             Anesthesia Physical  Anesthesia Plan  ASA: II  Anesthesia Plan: Epidural   Post-op Pain Management:    Induction:   PONV Risk Score and Plan:   Airway Management Planned:   Additional Equipment: None  Intra-op Plan:   Post-operative Plan:   Informed Consent: I have reviewed the patients History and Physical, chart, labs and discussed the procedure including the risks, benefits and alternatives for the proposed anesthesia with the patient or authorized representative who has indicated his/her understanding and acceptance.       Plan Discussed with:   Anesthesia Plan Comments: (Called to room for epidural while completing an epidural in another patient's room. I completed the epidural, returned the epidural cart, did a brief chart review and pre-epidural orders/paperwork. As I entered the room, the RN was just discontinuing nitrous oxide and the patient was clearly under it's effects. I told RN I would return once the nitrous had subsided. Exactly 15 minutes later I returned and  they were preparing for her to push. I returned to the patient's room after delivery and apologized that she was unable to receive an epidural. I explained that it is not possible to provide informed consent under the influence of nitrous oxide. She stated that she was glad she didn't get the epidural because "she wanted to do it without it." )       Anesthesia Quick Evaluation

## 2019-08-22 ENCOUNTER — Encounter (HOSPITAL_COMMUNITY): Payer: Self-pay | Admitting: Obstetrics and Gynecology

## 2019-08-22 LAB — CBC
HCT: 25.2 % — ABNORMAL LOW (ref 36.0–46.0)
Hemoglobin: 7.9 g/dL — ABNORMAL LOW (ref 12.0–15.0)
MCH: 23.8 pg — ABNORMAL LOW (ref 26.0–34.0)
MCHC: 31.3 g/dL (ref 30.0–36.0)
MCV: 75.9 fL — ABNORMAL LOW (ref 80.0–100.0)
Platelets: 185 10*3/uL (ref 150–400)
RBC: 3.32 MIL/uL — ABNORMAL LOW (ref 3.87–5.11)
RDW: 15.5 % (ref 11.5–15.5)
WBC: 10.9 10*3/uL — ABNORMAL HIGH (ref 4.0–10.5)
nRBC: 0 % (ref 0.0–0.2)

## 2019-08-22 MED ORDER — IBUPROFEN 600 MG PO TABS: 600.0000 mg | ORAL_TABLET | Freq: Four times a day (QID) | ORAL | 0 refills | Status: DC

## 2019-08-22 NOTE — Clinical Social Work Maternal (Signed)
CLINICAL SOCIAL WORK MATERNAL/CHILD NOTE  Patient Details  Name: Carol Hendrix MRN: [id number] Date of Birth: 02/15/2001  Date:  08/22/2019  Clinical Social Worker Initiating Note:  Jeanice Dempsey, LCSW Date/Time: Initiated:  08/22/19/0925     Child's Name:  Triston Holmes   Biological Parents:  Mother, Father(Carol Hendrix, Maurice Holmes)   Need for Interpreter:  None   Reason for Referral:  Current Substance Use/Substance Use During Pregnancy    Address:  [address] [place] [address]    Phone number:  [phone] (home)     Additional phone number: none   Household Members/Support Persons (HM/SP):   Household Member/Support Person 1   HM/SP Name Relationship DOB or Age  HM/SP -1 Karianna Hefter MOB   10/21/1999  HM/SP -2   Ja'iona Tussey  daughter   08/01/2018  HM/SP -3        HM/SP -4        HM/SP -5        HM/SP -6        HM/SP -7        HM/SP -8          Natural Supports (not living in the home):  Parent   Professional Supports: None   Employment: Full-time   Type of Work: [employer]   Education:  High school graduate   Homebound arranged:  n/a  Financial Resources:  Medicaid   Other Resources:  Food Stamps , WIC   Cultural/Religious Considerations Which May Impact Care:  none reported to CSW.   Strengths:  Ability to meet basic needs , Compliance with medical plan , Home prepared for child , Pediatrician chosen   Psychotropic Medications:    none reported.      Pediatrician:    [place]  Pediatrician List:   [facility]  [place]    [place]    [place]    [place]    [place]      Pediatrician Fax Number:    Risk Factors/Current Problems:  Substance Use , None   Cognitive State:  Insightful , Able to Concentrate , Alert    Mood/Affect:  Relaxed , Comfortable , Calm , Interested    CSW Assessment: CSW consulted as MOB reported THC use during  this pregnancy. CSW went to speak with MOB at bedside to address further needs.   CSW congratulated MOB on the birth of infant. CSW advised MOB of CSW's role and the reason for CSW coming to visit with her. MOB reported that she did use THC in November and suggested that her last use may have even been December-January. MOB reported a life stressor that occurred for MOB as well as MOB reported morning sickness. MOB expressed that THC helped with her morning sickness. MOB also reported that she spoke with OB in the past about this. MOB reported that she hasn't had any THC use since the time reported above. CSW understanding and advised MOB of the hospital drug screen policy. MOB was advised that if UDS or CDS of infants comes back positive then CSW would need to make a CPS report. MOB reported that she understood and reports that she had an open case in 2020 for daughters UDS/CDS. MOB reported that CPS arrived to her home and closed the case in two weeks. CSW followed up with [place] CPS as MOB reports that this is the county in which case was opened, CSW was notified   that MOB does have hx but the case was closed in January 2021.   CSW inquired from MOB On her mental health hx. MOB expressed that she has never been diagnosed with any mental health and denies any current concerns. MOB reported that she isn't feeling SI or HI and denies DV also. CSW was advised that MOB has all needed items to care for infant with no other needs at this time. MOB reports that she has WIC and Food Stamps.  CSW took time to provide MOB with PPD and SIDS education. MOB was given PPD Checklist in order to keep track of her feelings as they relate to PPD. MOB thanked CSW and reported no other needs.  CSW will continue to monitor infants CDS and UDS and make report to [place] CPS if warranted.   CSW Plan/Description:  Sudden Infant Death Syndrome (SIDS) Education, Perinatal Mood and Anxiety Disorder (PMADs) Education, No  Further Intervention Required/No Barriers to Discharge, CSW Will Continue to Monitor Umbilical Cord Tissue Drug Screen Results and Make Report if Warranted, Hospital Drug Screen Policy Information    Darrek Leasure S Yue Flanigan, LCSWA 08/22/2019, 10:07 AM 

## 2019-08-22 NOTE — Discharge Summary (Signed)
Obstetric Discharge Summary Reason for Admission: induction of labor Prenatal Procedures: none Intrapartum Procedures: spontaneous vaginal delivery Postpartum Procedures: none Complications-Operative and Postpartum: manual removal of the placenta from lower uterine segment Hemoglobin  Date Value Ref Range Status  08/22/2019 7.9 (L) 12.0 - 15.0 g/dL Final    Comment:    Reticulocyte Hemoglobin testing may be clinically indicated, consider ordering this additional test [id number]    HCT  Date Value Ref Range Status  08/22/2019 25.2 (L) 36.0 - 46.0 % Final    Physical Exam:  General: alert, cooperative and appears stated age 19: appropriate Uterine Fundus: firm DVT Evaluation: No evidence of DVT seen on physical exam.  Discharge Diagnoses: Term Pregnancy-delivered  Discharge Information: Date: 08/22/2019 Activity: pelvic rest Diet: routine Medications: PNV and Ibuprofen Condition: stable Instructions: refer to practice specific booklet Discharge to: home Follow-up Information    Waynard Reeds, MD Follow up in 4 week(s).   Specialty: Obstetrics and Gynecology Contact information: [address] [place] [address] [phone]           Newborn Data: Live born female  Birth Weight: 6 lb 7.9 oz (2946 g) APGAR: 8, 9  Newborn Delivery   Birth date/time: 08/21/2019 14:20:00 Delivery type: Vaginal, Spontaneous      Home with mother.  Lynell Greenhouse GEFFEL Malasia Torain 08/22/2019, 10:24 AM

## 2019-08-22 NOTE — Progress Notes (Signed)
Verified with MOB that the last feeding prior to 2230 feeding was at 1500 on 4/28. RN encouraged MOB to feed baby every 3 hours and discussed feeding amounts as well. Royston Cowper, RN

## 2019-08-22 NOTE — Progress Notes (Signed)
Patient is doing well.  She is ambulating, voiding, tolerating PO.  Pain control is good.  Lochia is appropriate  Vitals:   08/21/19 1700 08/21/19 2118 08/22/19 0124 08/22/19 0455  BP: 129/83 122/69 (!) 98/45 115/78  Pulse: 69 65 (!) 58 65  Resp: 16 18 16 16   Temp: 98.2 F (36.8 C) 98.6 F (37 C) 97.9 F (36.6 C) 98.2 F (36.8 C)  TempSrc: Oral Oral Oral Oral  SpO2: 99% 100% 100% 100%  Weight:      Height:        NAD Fundus firm Ext: no edema  Lab Results  Component Value Date   WBC 10.9 (H) 08/22/2019   HGB 7.9 (L) 08/22/2019   HCT 25.2 (L) 08/22/2019   MCV 75.9 (L) 08/22/2019   PLT 185 08/22/2019    --/--/O POS (04/28 0717)/RImmune  A/P 19 y.o. 04-11-1971 PPD#1. Routine care.   Meeting all goals.  Discharge to home today.  Desires circumcision. Discussed r/b/a of the procedure. Reviewed that circumcision is an elective surgical procedure and not considered medically necessary. Reviewed the risks of the procedure including the risk of infection, bleeding, damage to surrounding structures, including scrotum, shaft, urethra and head of penis, and an undesired cosmetic effect requiring additional procedures for revision. Consent signed.    [facility]

## 2019-09-05 NOTE — H&P (Signed)
Carol Hendrix is a 19 y.o. female presenting for EIOL  19 yo G2P1001 @ 39+0 oresents for [employer].  OB History    Gravida  2   Para  2   Term  2   Preterm  0   AB  0   Living  2     SAB  0   TAB  0   Ectopic  0   Multiple  0   Live Births  2          Past Medical History:  Diagnosis Date  . Anemia   . Asthma    Past Surgical History:  Procedure Laterality Date  . NO PAST SURGERIES     Family History: family history includes Healthy in her father and mother; Pulmonary fibrosis in her maternal grandmother. Social History:  reports that she has quit smoking. Her smoking use included cigarettes. She smoked 0.25 packs per day. She has never used smokeless tobacco. She reports current alcohol use. She reports current drug use. Drug: Marijuana.     Maternal Diabetes: No Genetic Screening: Normal Maternal Ultrasounds/Referrals: Normal Fetal Ultrasounds or other Referrals:  None Maternal Substance Abuse:  No Significant Maternal Medications:  None Significant Maternal Lab Results:  Group B Strep positive Other Comments:  None  Review of Systems History Dilation: 10 Effacement (%): 100 Station: Plus 2 Exam by:: m wilkins rnc Blood pressure 115/78, pulse 65, temperature 98.2 F (36.8 C), temperature source Oral, resp. rate 16, height 5\' 5"  (1.651 m), weight 69.7 kg, last menstrual period 10/28/2018, SpO2 100 %, unknown if currently breastfeeding. Exam Physical Exam  Prenatal labs: ABO, Rh: --/--/O POS (04/28 04-11-1971) Antibody: NEG (04/28 0717) Rubella: Immune (10/15 0000) RPR: NON REACTIVE (04/28 0730)  HBsAg: Negative (10/15 0000)  HIV: Non-reactive (10/15 0000)  GBS: Positive/-- (04/13 0000)   Assessment/Plan: 1) admit 2) augment 3) anticipate svd   08-02-1983 09/05/2019, 8:57 AM

## 2019-12-29 ENCOUNTER — Emergency Department (HOSPITAL_COMMUNITY)
Admission: EM | Admit: 2019-12-29 | Discharge: 2019-12-30 | Disposition: A | Discharge status: Home / Self Care | Payer: Medicaid Other | Attending: Emergency Medicine | Admitting: Emergency Medicine

## 2019-12-29 ENCOUNTER — Encounter (HOSPITAL_COMMUNITY): Payer: Self-pay | Admitting: Emergency Medicine

## 2019-12-29 ENCOUNTER — Other Ambulatory Visit: Payer: Self-pay

## 2019-12-29 DIAGNOSIS — Z79899 Other long term (current) drug therapy: Secondary | ICD-10-CM | POA: Insufficient documentation

## 2019-12-29 DIAGNOSIS — Z5321 Procedure and treatment not carried out due to patient leaving prior to being seen by health care provider: Secondary | ICD-10-CM | POA: Diagnosis not present

## 2019-12-29 DIAGNOSIS — U071 COVID-19: Secondary | ICD-10-CM | POA: Insufficient documentation

## 2019-12-29 DIAGNOSIS — J029 Acute pharyngitis, unspecified: Secondary | ICD-10-CM | POA: Diagnosis not present

## 2019-12-29 DIAGNOSIS — R0981 Nasal congestion: Secondary | ICD-10-CM | POA: Insufficient documentation

## 2019-12-29 DIAGNOSIS — J45909 Unspecified asthma, uncomplicated: Secondary | ICD-10-CM | POA: Insufficient documentation

## 2019-12-29 DIAGNOSIS — R439 Unspecified disturbances of smell and taste: Secondary | ICD-10-CM | POA: Insufficient documentation

## 2019-12-29 DIAGNOSIS — R05 Cough: Secondary | ICD-10-CM | POA: Diagnosis present

## 2019-12-29 DIAGNOSIS — H1033 Unspecified acute conjunctivitis, bilateral: Secondary | ICD-10-CM | POA: Insufficient documentation

## 2019-12-29 DIAGNOSIS — Z87891 Personal history of nicotine dependence: Secondary | ICD-10-CM | POA: Insufficient documentation

## 2019-12-29 NOTE — ED Triage Notes (Signed)
Pt c/o nasal congestion, sore throat and loss of taste and smell. Denies known covid contacts, reports she is not vaccinated.

## 2019-12-30 ENCOUNTER — Encounter (HOSPITAL_COMMUNITY): Payer: Self-pay | Admitting: *Deleted

## 2019-12-30 ENCOUNTER — Other Ambulatory Visit: Payer: Self-pay

## 2019-12-30 ENCOUNTER — Emergency Department (HOSPITAL_COMMUNITY)
Admission: EM | Admit: 2019-12-30 | Discharge: 2019-12-30 | Disposition: A | Discharge status: Home / Self Care | Payer: Medicaid Other | Source: Home / Self Care | Attending: Emergency Medicine | Admitting: Emergency Medicine

## 2019-12-30 ENCOUNTER — Encounter: Payer: Self-pay | Admitting: Unknown Physician Specialty

## 2019-12-30 DIAGNOSIS — H1033 Unspecified acute conjunctivitis, bilateral: Secondary | ICD-10-CM

## 2019-12-30 DIAGNOSIS — U071 COVID-19: Secondary | ICD-10-CM

## 2019-12-30 DIAGNOSIS — Z79899 Other long term (current) drug therapy: Secondary | ICD-10-CM | POA: Insufficient documentation

## 2019-12-30 DIAGNOSIS — Z87891 Personal history of nicotine dependence: Secondary | ICD-10-CM | POA: Insufficient documentation

## 2019-12-30 DIAGNOSIS — J45909 Unspecified asthma, uncomplicated: Secondary | ICD-10-CM | POA: Insufficient documentation

## 2019-12-30 LAB — SARS CORONAVIRUS 2 BY RT PCR (HOSPITAL ORDER, PERFORMED IN ~~LOC~~ HOSPITAL LAB): SARS Coronavirus 2: POSITIVE — AB

## 2019-12-30 MED ORDER — ERYTHROMYCIN 5 MG/GM OP OINT
TOPICAL_OINTMENT | OPHTHALMIC | 0 refills | Status: DC
Start: 2019-12-30 — End: 2020-05-14

## 2019-12-30 MED ORDER — BENZONATATE 100 MG PO CAPS
100.0000 mg | ORAL_CAPSULE | Freq: Three times a day (TID) | ORAL | 0 refills | Status: DC
Start: 2019-12-30 — End: 2020-05-14

## 2019-12-30 MED ORDER — FLUTICASONE PROPIONATE 50 MCG/ACT NA SUSP
2.0000 | Freq: Every day | NASAL | 0 refills | Status: DC
Start: 2019-12-30 — End: 2020-05-14

## 2019-12-30 MED ORDER — KETOROLAC TROMETHAMINE 30 MG/ML IJ SOLN
30.0000 mg | Freq: Once | INTRAMUSCULAR | Status: AC
  Administered 2019-12-30: 30 mg via INTRAMUSCULAR
  Filled 2019-12-30: qty 1

## 2019-12-30 MED ORDER — ALBUTEROL SULFATE HFA 108 (90 BASE) MCG/ACT IN AERS: 1.0000 | INHALATION_SPRAY | Freq: Four times a day (QID) | RESPIRATORY_TRACT | 0 refills | Status: DC | PRN

## 2019-12-30 MED ORDER — NAPROXEN 500 MG PO TABS
500.0000 mg | ORAL_TABLET | Freq: Two times a day (BID) | ORAL | 0 refills | Status: DC
Start: 2019-12-30 — End: 2020-05-14

## 2019-12-30 MED ORDER — CETIRIZINE HCL 10 MG PO TABS
10.0000 mg | ORAL_TABLET | Freq: Every day | ORAL | 0 refills | Status: DC
Start: 2019-12-30 — End: 2020-05-14

## 2019-12-30 NOTE — ED Notes (Signed)
Pt is not found in waiting room or outside.

## 2019-12-30 NOTE — ED Provider Notes (Signed)
[facility]-EMERGENCY DEPT Provider Note   CSN: [id number] Arrival date & time: 12/30/19  1520   History Chief Complaint  Patient presents with  . Covid Exposure    +covid    Carol Hendrix is a 19 y.o. female with past medical history significant for anemia, asthma who presents for evaluation of respiratory complaints.  Patient with headache, bilateral purulent conjunctivitis bilateral eyes, congestion, rhinorrhea, sore throat, cough, body aches and pains.  Went to yesterday however left due to wait.  She was called and told her Covid test was positive.  Unable to tolerate p.o. intake at home however is overall decreased appetite.  She has not take anything for symptoms.  Cough nonproductive.  Clear rhinorrhea.  Does have bilateral pruritus to eyes.  Denies fever, lightheadedness, dizziness, neck pain, neck stiffness, chest pain, shortness breath abdominal pain, diarrhea, dysuria.  No sudden onset headache.  Denies additional aggravating or relieving factors. Denies chance of pregnancy. Does not wear contacts. No visual field cuts, blurred vision.  History obtained from patient and past medical history. No interpretor was used.  HPI     Past Medical History:  Diagnosis Date  . Anemia   . Asthma     Patient Active Problem List   Diagnosis Date Noted  . [redacted] weeks gestation of pregnancy 08/21/2019  . Spontaneous vaginal delivery 08/21/2019  . Precipitous delivery   . Retained complete placenta   . Postpartum state 08/01/2018  . Indication for care in labor or delivery 07/31/2018  . [redacted] weeks gestation of pregnancy 03/25/2018  . Moderate persistent asthma 03/25/2018  . Tobacco use disorder 03/25/2018    Past Surgical History:  Procedure Laterality Date  . NO PAST SURGERIES       OB History    Gravida  2   Para  2   Term  2   Preterm  0   AB  0   Living  2     SAB  0   TAB  0   Ectopic  0   Multiple  0   Live Births  2            Family History  Problem Relation Age of Onset  . Healthy Mother   . Healthy Father   . Pulmonary fibrosis Maternal Grandmother     Social History   Tobacco Use  . Smoking status: Former Smoker    Packs/day: 0.25    Types: Cigarettes  . Smokeless tobacco: Never Used  . Tobacco comment: rolls marijuana in with tobacco  Vaping Use  . Vaping Use: Never used  Substance Use Topics  . Alcohol use: Yes    Comment: social   . Drug use: Yes    Types: Marijuana    Comment: last smoked 03/19/2019    Home Medications Prior to Admission medications   Medication Sig Start Date End Date Taking? Authorizing Provider  acetaminophen (TYLENOL) 500 MG tablet Take 1,000 mg by mouth every 6 (six) hours as needed for moderate pain.    [provider]  albuterol (VENTOLIN HFA) 108 (90 Base) MCG/ACT inhaler Inhale 1-2 puffs into the lungs every 6 (six) hours as needed for wheezing or shortness of breath. 12/30/19   Maryagnes Carrasco A, PA-C  benzonatate (TESSALON) 100 MG capsule Take 1 capsule (100 mg total) by mouth every 8 (eight) hours. 12/30/19   Harley Mccartney A, PA-C  cetirizine (ZYRTEC ALLERGY) 10 MG tablet Take 1 tablet (10 mg total) by mouth  daily. 12/30/19   Tiegan Terpstra A, PA-C  erythromycin ophthalmic ointment Place a 1/2 inch ribbon of ointment into the lower eyelid. 12/30/19   Earle Troiano A, PA-C  ferrous sulfate (FERROUSUL) 325 (65 FE) MG tablet Take 1 tablet (325 mg total) by mouth 2 (two) times daily. 07/20/19   Aviva Signs, CNM  fluticasone (FLONASE) 50 MCG/ACT nasal spray Place 2 sprays into both nostrils daily. 12/30/19   Avrian Delfavero A, PA-C  ibuprofen (ADVIL) 600 MG tablet Take 1 tablet (600 mg total) by mouth every 6 (six) hours. 08/22/19   Marlow Baars, MD  naproxen (NAPROSYN) 500 MG tablet Take 1 tablet (500 mg total) by mouth 2 (two) times daily. 12/30/19   Olyvia Gopal A, PA-C  ipratropium (ATROVENT) 0.03 % nasal spray Place 2 sprays into both nostrils  2 (two) times daily. Patient not taking: Reported on 07/31/2018 06/21/18 11/13/18  Bing Neighbors, FNP    Allergies    Patient has no known allergies.  Review of Systems   Review of Systems  Constitutional: Positive for activity change, appetite change and fatigue.  HENT: Positive for congestion, postnasal drip, rhinorrhea, sinus pressure, sinus pain, sneezing and sore throat. Negative for dental problem, ear discharge, ear pain, facial swelling, trouble swallowing and voice change.   Eyes: Positive for discharge, redness and itching.  Respiratory: Positive for cough. Negative for apnea, choking, chest tightness, shortness of breath, wheezing and stridor.   Cardiovascular: Negative.   Gastrointestinal: Negative.   Genitourinary: Negative.   Musculoskeletal: Negative.   Skin: Negative.   Neurological: Negative.   All other systems reviewed and are negative.   Physical Exam Updated Vital Signs BP (!) 126/92 (BP Location: Right Arm)   Pulse 97   Temp 98.9 F (37.2 C) (Oral)   Resp 16   Wt 68 kg   SpO2 100%   BMI 24.96 kg/m   Physical Exam Vitals and nursing note reviewed.  Constitutional:      General: She is not in acute distress.    Appearance: She is well-developed. She is not ill-appearing, toxic-appearing or diaphoretic.  HENT:     Head: Normocephalic and atraumatic.     Jaw: There is normal jaw occlusion.     Right Ear: Tympanic membrane, ear canal and external ear normal. There is no impacted cerumen. No hemotympanum. Tympanic membrane is not injected, scarred, perforated, erythematous, retracted or bulging.     Left Ear: Tympanic membrane, ear canal and external ear normal. There is no impacted cerumen. No hemotympanum. Tympanic membrane is not injected, scarred, perforated, erythematous, retracted or bulging.     Ears:     Comments: No Mastoid tenderness.    Nose: Nose normal.     Comments: Clear rhinorrhea and congestion to bilateral nares.  No sinus  tenderness.    Mouth/Throat:     Mouth: Mucous membranes are moist.     Comments: Posterior oropharynx clear.  Mucous membranes moist.  Tonsils without erythema or exudate.  Uvula midline without deviation.  No evidence of PTA or RPA.  No drooling, dysphasia or trismus.  Phonation normal. Eyes:     Extraocular Movements: Extraocular movements intact.     Conjunctiva/sclera:     Right eye: Right conjunctiva is injected.     Left eye: Left conjunctiva is injected.     Pupils: Pupils are equal, round, and reactive to light.     Visual Fields: Right eye visual fields normal and left eye visual fields normal.  Comments: Purulent discharge to bilateral eyes. EOM intact without pain. No periorbital edema, erythema, warmth  Neck:     Trachea: Trachea and phonation normal.     Meningeal: Brudzinski's sign and Kernig's sign absent.     Comments: No Neck stiffness or neck rigidity.  No meningismus.  No cervical lymphadenopathy. Cardiovascular:     Rate and Rhythm: Normal rate.     Comments: No murmurs rubs or gallops. Pulmonary:     Effort: Pulmonary effort is normal. No respiratory distress.     Breath sounds: Normal breath sounds.     Comments: Clear to auscultation bilaterally without wheeze, rhonchi or rales.  No accessory muscle usage.  Able speak in full sentences. Abdominal:     General: Bowel sounds are normal. There is no distension.     Tenderness: There is no abdominal tenderness. There is no right CVA tenderness, left CVA tenderness, guarding or rebound.     Comments: Soft, nontender without rebound or guarding.  No CVA tenderness.  Musculoskeletal:        General: No swelling, tenderness or deformity. Normal range of motion.     Cervical back: Normal range of motion.     Right lower leg: No edema.     Left lower leg: No edema.     Comments: Moves all 4 extremities without difficulty.  Lower extremities without edema, erythema or warmth.  Skin:    General: Skin is warm and dry.      Capillary Refill: Capillary refill takes less than 2 seconds.     Comments: Brisk capillary refill.  No rashes or lesions.  Neurological:     General: No focal deficit present.     Mental Status: She is alert and oriented to person, place, and time.     Comments: Ambulatory in department without difficulty.  Cranial nerves II through XII grossly intact.  No facial droop.  No aphasia.     ED Results / Procedures / Treatments   Labs (all labs ordered are listed, but only abnormal results are displayed) Labs Reviewed - No data to display  EKG None  Radiology No results found.  Procedures Procedures (including critical care time)  Medications Ordered in ED Medications  ketorolac (TORADOL) 30 MG/ML injection 30 mg (30 mg Intramuscular Given 12/30/19 1631)   ED Course  I have reviewed the triage vital signs and the nursing notes.  Pertinent labs & imaging results that were available during my care of the patient were reviewed by me and considered in my medical decision making (see chart for details).  19 year old presents for evaluation of COVID symptoms tested positive at [facility] yesterday.  Patient afebrile, nonseptic, non-ill-appearing.  Patient with headache, sore throat, congestion, rhinorrhea, cough, myalgias, loss of taste and smell.  Patient is nonfocal neuro exam.  No sudden onset thunderclap headache.  Heart lungs clear.  Abdomen soft, nontender.  Normal musculoskeletal exam.  Already p.o. intake at home without difficulty.  No drooling, dysphagia or trismus.  No evidence of PTA or RPA on exam.  She does have bilateral purulent discharge to bilateral conjunctivo-.  She is not a contact lens wear.  No visual field changes.  Question viral in nature versus early bacterial conjunctivitis.  Shared decision making.  Will start on drops.  Discussed symptomatic management for her Covid infection.  She denies chance of pregnancy.  Discussed reasons to return to emergency  department.  She voiced understanding and is agreeable for follow-up.  Armando Reichert  Jeremiah presents with symptoms consistent with bacterial conjunctivitis.  Purulent discharge exam.  No corneal abrasions, entrapment, consensual photophobia.  Presentation non-concerning for iritis, corneal abrasions, or HSV.  No evidence of preseptal or orbital cellulitis.  Pt isnot a contact lens wearer.  Patient will be given erythromycin ophthalmic.  Personal hygiene and frequent handwashing discussed.  Patient advised to followup with ophthalmologist for reevaluation in several days.  The patient has been appropriately medically screened and/or stabilized in the ED. I have low suspicion for any other emergent medical condition which would require further screening, evaluation or treatment in the ED or require inpatient management.  Patient is hemodynamically stable and in no acute distress.  Patient able to ambulate in department prior to ED.  Evaluation does not show acute pathology that would require ongoing or additional emergent interventions while in the emergency department or further inpatient treatment.  I have discussed the diagnosis with the patient and answered all questions.  Pain is been managed while in the emergency department and patient has no further complaints prior to discharge.  Patient is comfortable with plan discussed in room and is stable for discharge at this time.  I have discussed strict return precautions for returning to the emergency department.  Patient was encouraged to follow-up with PCP/specialist refer to at discharge.     MDM Rules/Calculators/A&P                          ZEPHANIAH ENYEART was evaluated in Emergency Department on 12/30/2019 for the symptoms described in the history of present illness. She was evaluated in the context of the global COVID-19 pandemic, which necessitated consideration that the patient might be at risk for infection with the SARS-CoV-2 virus that causes COVID-19.  Institutional protocols and algorithms that pertain to the evaluation of patients at risk for COVID-19 are in a state of rapid change based on information released by regulatory bodies including the CDC and federal and state organizations. These policies and algorithms were followed during the patient's care in the ED.  Final Clinical Impression(s) / ED Diagnoses Final diagnoses:  COVID-19  Acute bacterial conjunctivitis of both eyes    Rx / DC Orders ED Discharge Orders         Ordered    erythromycin ophthalmic ointment        12/30/19 1655    fluticasone (FLONASE) 50 MCG/ACT nasal spray  Daily        12/30/19 1656    cetirizine (ZYRTEC ALLERGY) 10 MG tablet  Daily        12/30/19 1656    benzonatate (TESSALON) 100 MG capsule  Every 8 hours        12/30/19 1656    albuterol (VENTOLIN HFA) 108 (90 Base) MCG/ACT inhaler  Every 6 hours PRN        12/30/19 1656    naproxen (NAPROSYN) 500 MG tablet  2 times daily        12/30/19 1656           Zahki Hoogendoorn A, PA-C 12/30/19 1657    Bethann Berkshire, MD 12/31/19 [phone]

## 2019-12-30 NOTE — ED Triage Notes (Signed)
Pt is sick with URI and has tested positive for covid.  Pt is tearful due to feeling unwell (sore throat and HA, body aches)

## 2019-12-30 NOTE — Discharge Instructions (Signed)
They are no longer offering the programs that we spoke about in the room.  Take the medications as prescribed.  Return for new or worsening symptoms

## 2020-03-16 DIAGNOSIS — Z3009 Encounter for other general counseling and advice on contraception: Secondary | ICD-10-CM | POA: Diagnosis not present

## 2020-03-16 DIAGNOSIS — Z1388 Encounter for screening for disorder due to exposure to contaminants: Secondary | ICD-10-CM | POA: Diagnosis not present

## 2020-03-16 DIAGNOSIS — Z0389 Encounter for observation for other suspected diseases and conditions ruled out: Secondary | ICD-10-CM | POA: Diagnosis not present

## 2020-05-13 ENCOUNTER — Other Ambulatory Visit: Payer: Self-pay

## 2020-05-13 ENCOUNTER — Ambulatory Visit (HOSPITAL_COMMUNITY)
Admission: EM | Admit: 2020-05-13 | Discharge: 2020-05-13 | Disposition: A | Discharge status: Home / Self Care | Payer: Medicaid Other | Attending: Family Medicine | Admitting: Family Medicine

## 2020-05-13 ENCOUNTER — Encounter (HOSPITAL_COMMUNITY): Payer: Self-pay

## 2020-05-13 ENCOUNTER — Emergency Department (HOSPITAL_COMMUNITY): Payer: Medicaid Other

## 2020-05-13 DIAGNOSIS — Z87891 Personal history of nicotine dependence: Secondary | ICD-10-CM | POA: Diagnosis not present

## 2020-05-13 DIAGNOSIS — J45909 Unspecified asthma, uncomplicated: Secondary | ICD-10-CM | POA: Insufficient documentation

## 2020-05-13 DIAGNOSIS — R1031 Right lower quadrant pain: Secondary | ICD-10-CM | POA: Insufficient documentation

## 2020-05-13 DIAGNOSIS — M545 Low back pain, unspecified: Secondary | ICD-10-CM | POA: Insufficient documentation

## 2020-05-13 DIAGNOSIS — R1032 Left lower quadrant pain: Secondary | ICD-10-CM | POA: Insufficient documentation

## 2020-05-13 DIAGNOSIS — N3090 Cystitis, unspecified without hematuria: Secondary | ICD-10-CM | POA: Insufficient documentation

## 2020-05-13 DIAGNOSIS — Z87440 Personal history of urinary (tract) infections: Secondary | ICD-10-CM | POA: Insufficient documentation

## 2020-05-13 DIAGNOSIS — R0789 Other chest pain: Secondary | ICD-10-CM | POA: Diagnosis not present

## 2020-05-13 DIAGNOSIS — R197 Diarrhea, unspecified: Secondary | ICD-10-CM | POA: Insufficient documentation

## 2020-05-13 DIAGNOSIS — J069 Acute upper respiratory infection, unspecified: Secondary | ICD-10-CM | POA: Diagnosis not present

## 2020-05-13 DIAGNOSIS — N760 Acute vaginitis: Secondary | ICD-10-CM | POA: Diagnosis not present

## 2020-05-13 DIAGNOSIS — Z20822 Contact with and (suspected) exposure to covid-19: Secondary | ICD-10-CM | POA: Diagnosis not present

## 2020-05-13 DIAGNOSIS — R52 Pain, unspecified: Secondary | ICD-10-CM

## 2020-05-13 DIAGNOSIS — R059 Cough, unspecified: Secondary | ICD-10-CM | POA: Diagnosis not present

## 2020-05-13 DIAGNOSIS — N3000 Acute cystitis without hematuria: Secondary | ICD-10-CM | POA: Diagnosis not present

## 2020-05-13 DIAGNOSIS — R109 Unspecified abdominal pain: Secondary | ICD-10-CM | POA: Diagnosis not present

## 2020-05-13 DIAGNOSIS — B9689 Other specified bacterial agents as the cause of diseases classified elsewhere: Secondary | ICD-10-CM | POA: Diagnosis not present

## 2020-05-13 LAB — POCT URINALYSIS DIPSTICK, ED / UC
Bilirubin Urine: NEGATIVE
Glucose, UA: NEGATIVE mg/dL
Ketones, ur: NEGATIVE mg/dL
Leukocytes,Ua: NEGATIVE
Nitrite: NEGATIVE
Protein, ur: NEGATIVE mg/dL
Specific Gravity, Urine: 1.025 (ref 1.005–1.030)
Urobilinogen, UA: 0.2 mg/dL (ref 0.0–1.0)
pH: 5.5 (ref 5.0–8.0)

## 2020-05-13 LAB — SARS CORONAVIRUS 2 (TAT 6-24 HRS): SARS Coronavirus 2: NEGATIVE

## 2020-05-13 LAB — POC URINE PREG, ED: Preg Test, Ur: NEGATIVE

## 2020-05-13 MED ORDER — PROMETHAZINE-DM 6.25-15 MG/5ML PO SYRP: 5.0000 mL | ORAL_SOLUTION | Freq: Four times a day (QID) | ORAL | 0 refills | Status: DC | PRN

## 2020-05-13 MED ORDER — PREDNISONE 20 MG PO TABS: 40.0000 mg | ORAL_TABLET | Freq: Every day | ORAL | 0 refills | Status: DC

## 2020-05-13 NOTE — ED Triage Notes (Signed)
Patient presents to Urgent Care with complaints of back pain, vaginal discharge with odor, abdominal pain, chest discomfort with inhalation and cough, diarrhea x 1 week. Treating discomfort with tylenol and ibuprofen. Has a hx of BV and UTIs.   Denies fever or n/v.

## 2020-05-13 NOTE — ED Triage Notes (Signed)
Patient arrived with complaints of sharp right sided pain that started two days ago and worsens with a deep breath and movement. States she was given prednisone at [facility] but "they did not tell her why"

## 2020-05-14 ENCOUNTER — Emergency Department (HOSPITAL_COMMUNITY)
Admission: EM | Admit: 2020-05-14 | Discharge: 2020-05-14 | Disposition: A | Discharge status: Home / Self Care | Payer: Medicaid Other | Attending: Emergency Medicine | Admitting: Emergency Medicine

## 2020-05-14 ENCOUNTER — Telehealth: Payer: Self-pay

## 2020-05-14 ENCOUNTER — Emergency Department (HOSPITAL_COMMUNITY): Payer: Medicaid Other

## 2020-05-14 ENCOUNTER — Telehealth (HOSPITAL_COMMUNITY): Payer: Self-pay | Admitting: Emergency Medicine

## 2020-05-14 DIAGNOSIS — N3090 Cystitis, unspecified without hematuria: Secondary | ICD-10-CM

## 2020-05-14 DIAGNOSIS — N760 Acute vaginitis: Secondary | ICD-10-CM

## 2020-05-14 DIAGNOSIS — R109 Unspecified abdominal pain: Secondary | ICD-10-CM | POA: Diagnosis not present

## 2020-05-14 DIAGNOSIS — B9689 Other specified bacterial agents as the cause of diseases classified elsewhere: Secondary | ICD-10-CM

## 2020-05-14 LAB — CERVICOVAGINAL ANCILLARY ONLY
Bacterial Vaginitis (gardnerella): POSITIVE — AB
Candida Glabrata: NEGATIVE
Candida Vaginitis: NEGATIVE
Chlamydia: POSITIVE — AB
Comment: NEGATIVE
Comment: NEGATIVE
Comment: NEGATIVE
Comment: NEGATIVE
Comment: NEGATIVE
Comment: NORMAL
Neisseria Gonorrhea: NEGATIVE
Trichomonas: NEGATIVE

## 2020-05-14 LAB — CBC WITH DIFFERENTIAL/PLATELET
Abs Immature Granulocytes: 0.05 10*3/uL (ref 0.00–0.07)
Basophils Absolute: 0 10*3/uL (ref 0.0–0.1)
Basophils Relative: 0 %
Eosinophils Absolute: 0.2 10*3/uL (ref 0.0–0.5)
Eosinophils Relative: 2 %
HCT: 28.6 % — ABNORMAL LOW (ref 36.0–46.0)
Hemoglobin: 8.7 g/dL — ABNORMAL LOW (ref 12.0–15.0)
Immature Granulocytes: 1 %
Lymphocytes Relative: 22 %
Lymphs Abs: 2.2 10*3/uL (ref 0.7–4.0)
MCH: 21.6 pg — ABNORMAL LOW (ref 26.0–34.0)
MCHC: 30.4 g/dL (ref 30.0–36.0)
MCV: 71.1 fL — ABNORMAL LOW (ref 80.0–100.0)
Monocytes Absolute: 1.2 10*3/uL — ABNORMAL HIGH (ref 0.1–1.0)
Monocytes Relative: 12 %
Neutro Abs: 6.4 10*3/uL (ref 1.7–7.7)
Neutrophils Relative %: 63 %
Platelets: 365 10*3/uL (ref 150–400)
RBC: 4.02 MIL/uL (ref 3.87–5.11)
RDW: 17.5 % — ABNORMAL HIGH (ref 11.5–15.5)
WBC: 10.1 10*3/uL (ref 4.0–10.5)
nRBC: 0 % (ref 0.0–0.2)

## 2020-05-14 LAB — COMPREHENSIVE METABOLIC PANEL
ALT: 11 U/L (ref 0–44)
AST: 12 U/L — ABNORMAL LOW (ref 15–41)
Albumin: 3.4 g/dL — ABNORMAL LOW (ref 3.5–5.0)
Alkaline Phosphatase: 62 U/L (ref 38–126)
Anion gap: 9 (ref 5–15)
BUN: 11 mg/dL (ref 6–20)
CO2: 23 mmol/L (ref 22–32)
Calcium: 8.6 mg/dL — ABNORMAL LOW (ref 8.9–10.3)
Chloride: 106 mmol/L (ref 98–111)
Creatinine, Ser: 0.55 mg/dL (ref 0.44–1.00)
GFR, Estimated: >60 mL/min (ref >60)
Glucose, Bld: 99 mg/dL (ref 70–99)
Potassium: 3.7 mmol/L (ref 3.5–5.1)
Sodium: 138 mmol/L (ref 135–145)
Total Bilirubin: 0.2 mg/dL — ABNORMAL LOW (ref 0.3–1.2)
Total Protein: 7.1 g/dL (ref 6.5–8.1)

## 2020-05-14 LAB — URINALYSIS, ROUTINE W REFLEX MICROSCOPIC
Bilirubin Urine: NEGATIVE
Glucose, UA: NEGATIVE mg/dL
Hgb urine dipstick: NEGATIVE
Ketones, ur: NEGATIVE mg/dL
Nitrite: NEGATIVE
Protein, ur: NEGATIVE mg/dL
Specific Gravity, Urine: 1.028 (ref 1.005–1.030)
pH: 5 (ref 5.0–8.0)

## 2020-05-14 LAB — PREGNANCY, URINE: Preg Test, Ur: NEGATIVE

## 2020-05-14 LAB — WET PREP, GENITAL
Sperm: NONE SEEN
Trich, Wet Prep: NONE SEEN
Yeast Wet Prep HPF POC: NONE SEEN

## 2020-05-14 LAB — LIPASE, BLOOD: Lipase: 22 U/L (ref 11–51)

## 2020-05-14 MED ORDER — CEPHALEXIN 500 MG PO CAPS
500.0000 mg | ORAL_CAPSULE | Freq: Once | ORAL | Status: AC
  Administered 2020-05-14: 500 mg via ORAL
  Filled 2020-05-14: qty 1

## 2020-05-14 MED ORDER — KETOROLAC TROMETHAMINE 30 MG/ML IJ SOLN
30.0000 mg | Freq: Once | INTRAMUSCULAR | Status: AC
  Administered 2020-05-14: 30 mg via INTRAVENOUS
  Filled 2020-05-14: qty 1

## 2020-05-14 MED ORDER — CEPHALEXIN 500 MG PO CAPS: 500.0000 mg | ORAL_CAPSULE | Freq: Four times a day (QID) | ORAL | 0 refills | Status: AC

## 2020-05-14 MED ORDER — HYDROCODONE-ACETAMINOPHEN 5-325 MG PO TABS
1.0000 | ORAL_TABLET | Freq: Once | ORAL | Status: AC
  Administered 2020-05-14: 1 via ORAL
  Filled 2020-05-14: qty 1

## 2020-05-14 MED ORDER — KETOROLAC TROMETHAMINE 30 MG/ML IJ SOLN
60.0000 mg | Freq: Once | INTRAMUSCULAR | Status: DC
Start: 2020-05-14 — End: 2020-05-14

## 2020-05-14 MED ORDER — NAPROXEN 375 MG PO TABS: 375.0000 mg | ORAL_TABLET | Freq: Two times a day (BID) | ORAL | 0 refills | Status: DC

## 2020-05-14 MED ORDER — DOXYCYCLINE HYCLATE 100 MG PO CAPS: 100.0000 mg | ORAL_CAPSULE | Freq: Two times a day (BID) | ORAL | 0 refills | Status: DC

## 2020-05-14 MED ORDER — METRONIDAZOLE 500 MG PO TABS: 500.0000 mg | ORAL_TABLET | Freq: Two times a day (BID) | ORAL | 0 refills | Status: DC

## 2020-05-14 MED ORDER — DOXYCYCLINE HYCLATE 100 MG PO CAPS: 100.0000 mg | ORAL_CAPSULE | Freq: Two times a day (BID) | ORAL | 0 refills | Status: AC

## 2020-05-14 NOTE — Telephone Encounter (Signed)
Patient called back and wanted prescription sent elsewhere

## 2020-05-14 NOTE — Discharge Instructions (Addendum)
Your pelvic exam showed that you have bacterial vaginosis. Please pick up prescription and take as prescribed. Do not drink alcohol while on this medication. We have also sent swabs for gonorrhea and chlamydia - please await your results. We will call if you test positive. You can also log into MyChart and check the results that way. Refrain from intercourse until you hear about your results.   As we discussed, your CT scan did not show any evidence of kidney stone.  There was some inflammation around the bladder that could be consistent with a urinary tract infection.  Because he just had any bacteria in urine, we will plan to treat this. Take antibiotics as directed. Please take all of your antibiotics until finished.  Return to the Emergency Department for any worsening pain with urination, blood in your urine, fever, persistent vomiting, abdominal pain or any other worsening or concerning symptoms.

## 2020-05-14 NOTE — Telephone Encounter (Signed)
Transition of Care Contact from Inpatient Facility  Date of Discharge: 05/14/20 Date of Contact: 05/14/20 Method of contact: phone - attempted  Attempted to contact patient to discuss transition of care from inpatient admission.  Patient did not answer the phone.  Message was left on patient's voicemail informing them we would attempt to call them again   [place], [place]

## 2020-05-14 NOTE — ED Provider Notes (Signed)
[facility]-EMERGENCY DEPT Provider Note   CSN: [id number] Arrival date & time: 05/13/20  2251     History No chief complaint on file.   Carol Hendrix is a 20 y.o. female with PMHx asthma who presents to the ED today with complaint of gradual onset, constant, waxing and waning, right flank pain x 2 days. Pt states the pain radiates into her back and up into her shoulder blade. She also reports pain is worse with coughing which she has been doing for the past several days. She has also had diarrhea a couple of days ago that has since resolved. She reports the pain will get so bad that it will take her breath away. She went to UC yesterday and had a U/A done with findings of hgb in her urine - LNMP 2 weeks ago. Pt also was tested for COVID which has returned negative. She was discharged with cough medicine and prednisone. Pt denies any nausea or vomiting. No history of kidney stones. No chest pain. No recent sick contacts. No fevers or chills.   Pt also complains of vaginal discharge - she reports history of BV and states this feels similar. She is sexually active with 1 female partner however denies being worried about STDs.   The history is provided by the patient and medical records.       Past Medical History:  Diagnosis Date  . Anemia   . Asthma     Patient Active Problem List   Diagnosis Date Noted  . [redacted] weeks gestation of pregnancy 08/21/2019  . Spontaneous vaginal delivery 08/21/2019  . Precipitous delivery   . Retained complete placenta   . Postpartum state 08/01/2018  . Indication for care in labor or delivery 07/31/2018  . [redacted] weeks gestation of pregnancy 03/25/2018  . Moderate persistent asthma 03/25/2018  . Tobacco use disorder 03/25/2018    Past Surgical History:  Procedure Laterality Date  . NO PAST SURGERIES       OB History    Gravida  2   Para  2   Term  2   Preterm  0   AB  0   Living  2     SAB  0   IAB  0   Ectopic  0    Multiple  0   Live Births  2           Family History  Problem Relation Age of Onset  . Healthy Mother   . Healthy Father   . Pulmonary fibrosis Maternal Grandmother     Social History   Tobacco Use  . Smoking status: Former Smoker    Packs/day: 0.25    Types: Cigarettes  . Smokeless tobacco: Never Used  . Tobacco comment: rolls marijuana in with tobacco  Vaping Use  . Vaping Use: Never used  Substance Use Topics  . Alcohol use: Yes    Comment: social   . Drug use: Yes    Types: Marijuana    Comment: last smoked 03/19/2019    Home Medications Prior to Admission medications   Medication Sig Start Date End Date Taking? Authorizing Provider  acetaminophen (TYLENOL) 500 MG tablet Take 1,000 mg by mouth every 6 (six) hours as needed for moderate pain.   Yes [provider]  albuterol (VENTOLIN HFA) 108 (90 Base) MCG/ACT inhaler Inhale 1-2 puffs into the lungs every 6 (six) hours as needed for wheezing or shortness of breath. 12/30/19  Yes Henderly, Britni A,  PA-C  ibuprofen (ADVIL) 600 MG tablet Take 1 tablet (600 mg total) by mouth every 6 (six) hours. Patient taking differently: Take 600 mg by mouth every 6 (six) hours as needed for mild pain. 08/22/19  Yes Marlow Baarslark, Dyanna, MD  metroNIDAZOLE (FLAGYL) 500 MG tablet Take 1 tablet (500 mg total) by mouth 2 (two) times daily. 05/14/20  Yes Hyman HopesVenter, Dior Dominik, PA-C  predniSONE (DELTASONE) 20 MG tablet Take 2 tablets (40 mg total) by mouth daily with breakfast. 05/13/20   Particia NearingLane, Rachel Elizabeth, PA-C  promethazine-dextromethorphan (PROMETHAZINE-DM) 6.25-15 MG/5ML syrup Take 5 mLs by mouth 4 (four) times daily as needed for cough. 05/13/20   Particia NearingLane, Rachel Elizabeth, PA-C  ipratropium (ATROVENT) 0.03 % nasal spray Place 2 sprays into both nostrils 2 (two) times daily. Patient not taking: Reported on 07/31/2018 06/21/18 11/13/18  Bing NeighborsHarris, Kimberly S, FNP    Allergies    Patient has no known allergies.  Review of Systems   Review of  Systems  Constitutional: Negative for chills and fever.  Respiratory: Positive for cough. Negative for shortness of breath.   Cardiovascular: Negative for chest pain.  Gastrointestinal: Positive for abdominal pain and diarrhea (resolved). Negative for nausea and vomiting.  Genitourinary: Positive for flank pain.  Musculoskeletal: Positive for back pain.  All other systems reviewed and are negative.   Physical Exam Updated Vital Signs BP 112/71   Pulse 62   Temp 98.4 F (36.9 C) (Oral)   Resp 18   SpO2 99%   Physical Exam Vitals and nursing note reviewed.  Constitutional:      Appearance: She is not ill-appearing or diaphoretic.  HENT:     Head: Normocephalic and atraumatic.  Eyes:     Conjunctiva/sclera: Conjunctivae normal.  Cardiovascular:     Rate and Rhythm: Normal rate and regular rhythm.     Pulses: Normal pulses.  Pulmonary:     Effort: Pulmonary effort is normal.     Breath sounds: Normal breath sounds. No wheezing, rhonchi or rales.     Comments: Speaking in full sentences without difficulty. Satting 99% on RA.  Abdominal:     Palpations: Abdomen is soft.     Tenderness: There is abdominal tenderness. There is right CVA tenderness. There is no guarding or rebound.     Comments: Soft, diffuse abdominal TTP with right CVA TTP, +BS throughout, no r/g/r, neg murphy's, neg mcburney's  Genitourinary:    Comments: Chaperone present for exam. No rashes, lesions, or tenderness to external genitalia. No erythema, injury, or tenderness to vaginal mucosa. Thin white vaginal discharge in vault.  No adnexal masses, tenderness, or fullness. No CMT, cervical friability, or discharge from cervical os. Cervical os is closed. Uterus non-deviated, mobile, nonTTP, and without enlargement.  Musculoskeletal:     Cervical back: Neck supple.  Skin:    General: Skin is warm and dry.  Neurological:     Mental Status: She is alert.     ED Results / Procedures / Treatments   Labs (all  labs ordered are listed, but only abnormal results are displayed) Labs Reviewed  WET PREP, GENITAL - Abnormal; Notable for the following components:      Result Value   Clue Cells Wet Prep HPF POC PRESENT (*)    WBC, Wet Prep HPF POC MANY (*)    All other components within normal limits  CBC WITH DIFFERENTIAL/PLATELET - Abnormal; Notable for the following components:   Hemoglobin 8.7 (*)    HCT 28.6 (*)    MCV  71.1 (*)    MCH 21.6 (*)    RDW 17.5 (*)    Monocytes Absolute 1.2 (*)    All other components within normal limits  URINALYSIS, ROUTINE W REFLEX MICROSCOPIC - Abnormal; Notable for the following components:   APPearance HAZY (*)    Leukocytes,Ua TRACE (*)    Bacteria, UA FEW (*)    All other components within normal limits  COMPREHENSIVE METABOLIC PANEL - Abnormal; Notable for the following components:   Calcium 8.6 (*)    Albumin 3.4 (*)    AST 12 (*)    Total Bilirubin 0.2 (*)    All other components within normal limits  URINE CULTURE  PREGNANCY, URINE  LIPASE, BLOOD  GC/CHLAMYDIA PROBE AMP ([place]) NOT AT [facility]    EKG None  Radiology DG Chest 2 View  Result Date: 05/13/2020 CLINICAL DATA:  Right flank pain times 48 hours. EXAM: CHEST - 2 VIEW COMPARISON:  October 20, 2017 FINDINGS: The heart size and mediastinal contours are within normal limits. Both lungs are clear. The visualized skeletal structures are unremarkable. IMPRESSION: No active cardiopulmonary disease. Electronically Signed   By: Aram Candela M.D.   On: 05/13/2020 23:26    Procedures Procedures (including critical care time)  Medications Ordered in ED Medications  ketorolac (TORADOL) 30 MG/ML injection 60 mg (has no administration in time range)    ED Course  I have reviewed the triage vital signs and the nursing notes.  Pertinent labs & imaging results that were available during my care of the patient were reviewed by me and considered in my medical decision making (see chart for  details).  Clinical Course as of 05/14/20 0645  Thu May 14, 2020  0610 Hemoglobin(!): 8.7 chronic [MV]  0620 Clue Cells Wet Prep HPF POC(!): PRESENT [MV]    Clinical Course User Index [MV] [name], PA-C   MDM Rules/Calculators/A&P                          20 year old female presents to the ED today complaining of right-sided flank pain for the past 2 days.  Has also been having some diarrhea and a cough.  She went to urgent care yesterday and had a COVID test which was pending, discharged with prednisone and cough medication.  She did have a urinalysis done which did show hemoglobin.  She denies any obvious hematuria.  No history of kidney stones.  Last normal menstrual period 2 weeks ago.  On arrival to the ED vitals are stable.  Patient is afebrile, nontachycardic and nontachypneic.  She is satting 100% on room air.  On exam she appears to be in no acute distress.  She does have diffuse abdominal tenderness to palpation as well as right CVA tenderness.  Concern for kidney stone.  While patient was in the waiting room she had a chest x-ray which did not show any acute findings.  Patient states she has had a mild cough.  She states her pain is worse with coughing and states that the pain will take her breath away however she denies pain with deep inspiration.  I have very low suspicion for PE at this time given stable vital signs and no complaint of chest pain or shortness of breath.  Unsure why patient had a chest x-ray done while in the waiting room.  We will plan for lab work including a CBC, CMP, lipase.  We will obtain a urinalysis at this time.  Patient is also having some vaginal discharge, will plan for pelvic exam however she denies any pelvic pain.  I have lower suspicion that the vaginal discharge is causing her pain however very possible, will assess for any cervical motion tenderness or adnexal tenderness.  Pelvic exam performed; thin white discharge in vault. No CMT or adnexal  TTP. Wet prep positive for BV. Will prescribed flagyl   U/A has returned with trace leuks, 21-50 RBCs, 11-20 WBCs, and few bacteria. Have added on a CT renal stone study at this time with concern for kidney stone. Pt without infectious symptoms; doubt infected stone vs pyelo at this time.   At shift change case signed out to Graciella Freer, PA-C, who will dispo patient accordingly after CT scan. If negative pt to be discharged with her flagyl that has already been sent to pharmacy. She does not require fluid challenge as she has not had any vomiting.   This note was prepared using Dragon voice recognition software and may include unintentional dictation errors due to the inherent limitations of voice recognition software.   Final Clinical Impression(s) / ED Diagnoses Final diagnoses:  Flank pain  Bacterial vaginosis    Rx / DC Orders ED Discharge Orders         Ordered    metroNIDAZOLE (FLAGYL) 500 MG tablet  2 times daily        05/14/20 0639           [name], PA-C 05/14/20 0645    Molpus, Jonny Ruiz, MD 05/14/20 [phone]

## 2020-05-14 NOTE — ED Provider Notes (Signed)
Care assumed from Carol Hendrix, [place] at shift change with CT renal study pending.   In brief, this patient is a 20 year old female past med history of asthma who presents for evaluation of 2 days of right flank pain.  She reports that the pain radiates into her back.  She states that she has had diarrhea.  She also reports she is having some vaginal discharge.  She has a history of BV and states that symptoms feel similar.  She is currently sexually active with 1 partner.  Please see note from previous provider for full history/physical exam.   Physical Exam  BP 112/71   Pulse 62   Temp 98.4 F (36.9 C) (Oral)   Resp 18   SpO2 99%   Physical Exam  Sleeping comfortably, NAD. Mild suprapubic tenderness. No focal RLQ tenderness noted at McBurney's point.   ED Course/Procedures   Clinical Course as of 05/14/20 0736  Thu May 14, 2020  0610 Hemoglobin(!): 8.7 chronic [MV]  0620 Clue Cells Wet Prep HPF POC(!): PRESENT [MV]    Clinical Course User Index [MV] [name], PA-C    Procedures  Results for orders placed or performed during the hospital encounter of 05/14/20 (from the past 24 hour(s))  CBC with Differential     Status: Abnormal   Collection Time: 05/14/20  5:05 AM  Result Value Ref Range   WBC 10.1 4.0 - 10.5 K/uL   RBC 4.02 3.87 - 5.11 MIL/uL   Hemoglobin 8.7 (L) 12.0 - 15.0 g/dL   HCT 57.8 (L) 46.9 - 62.9 %   MCV 71.1 (L) 80.0 - 100.0 fL   MCH 21.6 (L) 26.0 - 34.0 pg   MCHC 30.4 30.0 - 36.0 g/dL   RDW 52.8 (H) 41.3 - 24.4 %   Platelets 365 150 - 400 K/uL   nRBC 0.0 0.0 - 0.2 %   Neutrophils Relative % 63 %   Neutro Abs 6.4 1.7 - 7.7 K/uL   Lymphocytes Relative 22 %   Lymphs Abs 2.2 0.7 - 4.0 K/uL   Monocytes Relative 12 %   Monocytes Absolute 1.2 (H) 0.1 - 1.0 K/uL   Eosinophils Relative 2 %   Eosinophils Absolute 0.2 0.0 - 0.5 K/uL   Basophils Relative 0 %   Basophils Absolute 0.0 0.0 - 0.1 K/uL   Immature Granulocytes 1 %   Abs Immature  Granulocytes 0.05 0.00 - 0.07 K/uL  Comprehensive metabolic panel     Status: Abnormal   Collection Time: 05/14/20  5:05 AM  Result Value Ref Range   Sodium 138 135 - 145 mmol/L   Potassium 3.7 3.5 - 5.1 mmol/L   Chloride 106 98 - 111 mmol/L   CO2 23 22 - 32 mmol/L   Glucose, Bld 99 70 - 99 mg/dL   BUN 11 6 - 20 mg/dL   Creatinine, Ser 0.10 0.44 - 1.00 mg/dL   Calcium 8.6 (L) 8.9 - 10.3 mg/dL   Total Protein 7.1 6.5 - 8.1 g/dL   Albumin 3.4 (L) 3.5 - 5.0 g/dL   AST 12 (L) 15 - 41 U/L   ALT 11 0 - 44 U/L   Alkaline Phosphatase 62 38 - 126 U/L   Total Bilirubin 0.2 (L) 0.3 - 1.2 mg/dL   GFR, Estimated >27 >25 mL/min   Anion gap 9 5 - 15  Lipase, blood     Status: None   Collection Time: 05/14/20  5:05 AM  Result Value Ref Range   Lipase 22  11 - 51 U/L  Urinalysis, Routine w reflex microscopic     Status: Abnormal   Collection Time: 05/14/20  5:40 AM  Result Value Ref Range   Color, Urine YELLOW YELLOW   APPearance HAZY (A) CLEAR   Specific Gravity, Urine 1.028 1.005 - 1.030   pH 5.0 5.0 - 8.0   Glucose, UA NEGATIVE NEGATIVE mg/dL   Hgb urine dipstick NEGATIVE NEGATIVE   Bilirubin Urine NEGATIVE NEGATIVE   Ketones, ur NEGATIVE NEGATIVE mg/dL   Protein, ur NEGATIVE NEGATIVE mg/dL   Nitrite NEGATIVE NEGATIVE   Leukocytes,Ua TRACE (A) NEGATIVE   RBC / HPF 21-50 0 - 5 RBC/hpf   WBC, UA 11-20 0 - 5 WBC/hpf   Bacteria, UA FEW (A) NONE SEEN   Squamous Epithelial / LPF 0-5 0 - 5   Mucus PRESENT   Pregnancy, urine     Status: None   Collection Time: 05/14/20  5:40 AM  Result Value Ref Range   Preg Test, Ur NEGATIVE NEGATIVE  Wet prep, genital     Status: Abnormal   Collection Time: 05/14/20  5:45 AM   Specimen: PATH Cytology Cervicovaginal Ancillary Only  Result Value Ref Range   Yeast Wet Prep HPF POC NONE SEEN NONE SEEN   Trich, Wet Prep NONE SEEN NONE SEEN   Clue Cells Wet Prep HPF POC PRESENT (A) NONE SEEN   WBC, Wet Prep HPF POC MANY (A) NONE SEEN   Sperm NONE SEEN      MDM   PLAN: Patient pending CT scan for eval of possible kidney stone. She will treated for BV. Anticipate d/c home after CT scan.   MDM:  UA shows trace leukocytes, pyuria.  Urine pregnancy is negative.  Lipase is unremarkable.  CMP shows normal BUN/creatinine.  CBC shows hemoglobin of 8.7.  This is her baseline. No leukocytosis.  CT renal study shows no evidence of kidney stone. There is some fat stranding around the bladder that could be consistent with cystitis. Visible appendix does not look inflamed.   Reevaluation.  Patient resting comfortably on bed.  No signs of distress.  She reports that her pain is improved.  On my examination, she has mild suprapubic abdominal tenderness.  No focal tenderness in the right lower quadrant.  At this time, do not feel her symptoms are suggestive of appendicitis.  She has no leukocytosis, fever, nausea/vomiting.  Given that she is having some flank pain as well as evidence of pyuria, leukocytes in her urine as well as inflammation around the bladder, we will plan to treat this as possible cystitis.  Patient with no known drug allergies. At this time, patient exhibits no emergent life-threatening condition that require further evaluation in ED. Patient had ample opportunity for questions and discussion. All patient's questions were answered with full understanding. Strict return precautions discussed. Patient expresses understanding and agreement to plan.    1. Flank pain   2. Bacterial vaginosis   3. Cystitis     Portions of this note were generated with Dragon dictation software. Dictation errors may occur despite best attempts at proofreading.     [name], PA-C 05/14/20 0736    [name], MD 05/14/20 [phone]

## 2020-05-14 NOTE — ED Provider Notes (Signed)
[facility]    CSN: [id number] Arrival date & time: 05/13/20  0856      History   Chief Complaint Chief Complaint  Patient presents with  . Urinary Tract Infection    HPI Carol Hendrix is a [age] y.o. female.   Here today with about a week of b/l low back pain, vaginal discharge with odor, b/l lower abdominal pain, chest tightness with coughing, diarrhea. Denies known fever, CP, SOB, N/V/D, known exposure to STIs. Not trying anything OTC for sxs. Hx of BV, UTIs.      Past Medical History:  Diagnosis Date  . Anemia   . Asthma     Patient Active Problem List   Diagnosis Date Noted  . [redacted] weeks gestation of pregnancy 08/21/2019  . Spontaneous vaginal delivery 08/21/2019  . Precipitous delivery   . Retained complete placenta   . Postpartum state 08/01/2018  . Indication for care in labor or delivery 07/31/2018  . [redacted] weeks gestation of pregnancy 03/25/2018  . Moderate persistent asthma 03/25/2018  . Tobacco use disorder 03/25/2018    Past Surgical History:  Procedure Laterality Date  . NO PAST SURGERIES      OB History    Gravida  2   Para  2   Term  2   Preterm  0   AB  0   Living  2     SAB  0   IAB  0   Ectopic  0   Multiple  0   Live Births  2            Home Medications    Prior to Admission medications   Medication Sig Start Date End Date Taking? Authorizing Provider  predniSONE (DELTASONE) 20 MG tablet Take 2 tablets (40 mg total) by mouth daily with breakfast. 05/13/20  Yes Particia NearingLane, Ainsleigh Kakos Elizabeth, PA-C  promethazine-dextromethorphan (PROMETHAZINE-DM) 6.25-15 MG/5ML syrup Take 5 mLs by mouth 4 (four) times daily as needed for cough. 05/13/20  Yes Particia NearingLane, Tiffanni Scarfo Elizabeth, PA-C  acetaminophen (TYLENOL) 500 MG tablet Take 1,000 mg by mouth every 6 (six) hours as needed for moderate pain.    [provider]  albuterol (VENTOLIN HFA) 108 (90 Base) MCG/ACT inhaler Inhale 1-2 puffs into the lungs every 6 (six) hours as  needed for wheezing or shortness of breath. 12/30/19   Henderly, Britni A, PA-C  cephALEXin (KEFLEX) 500 MG capsule Take 1 capsule (500 mg total) by mouth 4 (four) times daily for 7 days. 05/14/20 05/21/20  Maxwell CaulLayden, Lindsey A, PA-C  doxycycline (VIBRAMYCIN) 100 MG capsule Take 1 capsule (100 mg total) by mouth 2 (two) times daily for 7 days. 05/14/20 05/21/20  Merrilee JanskyLamptey, Philip O, MD  ibuprofen (ADVIL) 600 MG tablet Take 1 tablet (600 mg total) by mouth every 6 (six) hours. Patient taking differently: Take 600 mg by mouth every 6 (six) hours as needed for mild pain. 08/22/19   Marlow Baarslark, Dyanna, MD  metroNIDAZOLE (FLAGYL) 500 MG tablet Take 1 tablet (500 mg total) by mouth 2 (two) times daily. 05/14/20   Hyman HopesVenter, Margaux, PA-C  naproxen (NAPROSYN) 375 MG tablet Take 1 tablet (375 mg total) by mouth 2 (two) times daily. 05/14/20   Graciella FreerLayden, Lindsey A, PA-C  ipratropium (ATROVENT) 0.03 % nasal spray Place 2 sprays into both nostrils 2 (two) times daily. Patient not taking: Reported on 07/31/2018 06/21/18 11/13/18  Bing NeighborsHarris, Kimberly S, FNP    Family History Family History  Problem Relation Age of Onset  . Healthy  Mother   . Healthy Father   . Pulmonary fibrosis Maternal Grandmother     Social History Social History   Tobacco Use  . Smoking status: Former Smoker    Packs/day: 0.25    Types: Cigarettes  . Smokeless tobacco: Never Used  . Tobacco comment: rolls marijuana in with tobacco  Vaping Use  . Vaping Use: Never used  Substance Use Topics  . Alcohol use: Yes    Comment: social   . Drug use: Yes    Types: Marijuana    Comment: last smoked 03/19/2019     Allergies   Patient has no known allergies.   Review of Systems Review of Systems PER HPI    Physical Exam Triage Vital Signs ED Triage Vitals  Enc Vitals Group     BP 05/13/20 0926 118/71     Pulse Rate 05/13/20 0926 72     Resp 05/13/20 0926 16     Temp 05/13/20 0926 98.4 F (36.9 C)     Temp Source 05/13/20 0926 Oral     SpO2  05/13/20 0926 98 %     Weight 05/13/20 0923 134 lb (60.8 kg)     Height --      Head Circumference --      Peak Flow --      Pain Score 05/13/20 0923 5     Pain Loc --      Pain Edu? --      Excl. in GC? --    No data found.  Updated Vital Signs BP 118/71 (BP Location: Left Arm)   Pulse 72   Temp 98.4 F (36.9 C) (Oral)   Resp 16   Wt 134 lb (60.8 kg)   LMP  (Approximate) Comment: 1 week ago   SpO2 98%   BMI 22.30 kg/m   Visual Acuity Right Eye Distance:   Left Eye Distance:   Bilateral Distance:    Right Eye Near:   Left Eye Near:    Bilateral Near:     Physical Exam Vitals and nursing note reviewed.  Constitutional:      Appearance: Normal appearance. She is not ill-appearing.  HENT:     Head: Atraumatic.     Right Ear: Tympanic membrane normal.     Left Ear: Tympanic membrane normal.     Nose: Nose normal.     Mouth/Throat:     Mouth: Mucous membranes are moist.     Pharynx: Oropharynx is clear. Posterior oropharyngeal erythema present.  Eyes:     Extraocular Movements: Extraocular movements intact.     Conjunctiva/sclera: Conjunctivae normal.  Cardiovascular:     Rate and Rhythm: Normal rate and regular rhythm.     Heart sounds: Normal heart sounds.  Pulmonary:     Effort: Pulmonary effort is normal. No respiratory distress.     Breath sounds: Normal breath sounds. No wheezing or rales.  Abdominal:     General: Bowel sounds are normal. There is no distension.     Palpations: Abdomen is soft.     Tenderness: There is abdominal tenderness (b/l lower abdominal ttp). There is no right CVA tenderness, left CVA tenderness or guarding.  Genitourinary:    Comments: GU exam deferred, self swab performed Musculoskeletal:        General: Normal range of motion.     Cervical back: Normal range of motion and neck supple.  Skin:    General: Skin is warm and dry.  Neurological:     Mental  Status: She is alert and oriented to person, place, and time.   Psychiatric:        Mood and Affect: Mood normal.        Thought Content: Thought content normal.        Judgment: Judgment normal.      UC Treatments / Results  Labs (all labs ordered are listed, but only abnormal results are displayed) Labs Reviewed  POCT URINALYSIS DIPSTICK, ED / UC - Abnormal; Notable for the following components:      Result Value   Hgb urine dipstick MODERATE (*)    All other components within normal limits  CERVICOVAGINAL ANCILLARY ONLY - Abnormal; Notable for the following components:   Bacterial Vaginitis (gardnerella) Positive (*)    Chlamydia Positive (*)    All other components within normal limits  SARS CORONAVIRUS 2 (TAT 6-24 HRS)  POC URINE PREG, ED    EKG   Radiology DG Chest 2 View  Result Date: 05/13/2020 CLINICAL DATA:  Right flank pain times 48 hours. EXAM: CHEST - 2 VIEW COMPARISON:  October 20, 2017 FINDINGS: The heart size and mediastinal contours are within normal limits. Both lungs are clear. The visualized skeletal structures are unremarkable. IMPRESSION: No active cardiopulmonary disease. Electronically Signed   By: Aram Candela M.D.   On: 05/13/2020 23:26   CT Renal Stone Study  Result Date: 05/14/2020 CLINICAL DATA:  Right flank pain for 2 days.  Pyuria EXAM: CT ABDOMEN AND PELVIS WITHOUT CONTRAST TECHNIQUE: Multidetector CT imaging of the abdomen and pelvis was performed following the standard protocol without IV contrast. COMPARISON:  None. FINDINGS: Lower chest:  No contributory findings. Hepatobiliary: No focal liver abnormality.No evidence of biliary obstruction or stone. Pancreas: Unremarkable. Spleen: Unremarkable. Adrenals/Urinary Tract: Negative adrenals. No hydronephrosis or stone. Suspect fat stranding around the urinary bladder. No visible inflammation around the kidneys, which are symmetric in size. Stomach/Bowel: No obstruction. The visible appendix does not appear inflamed. Vascular/Lymphatic: No acute vascular  abnormality. No mass or adenopathy. Reproductive:No pathologic findings. Other: No ascites or pneumoperitoneum. Musculoskeletal: No acute abnormalities. IMPRESSION: 1. Suspect cystitis. 2. No hydronephrosis or urolithiasis. Electronically Signed   By: Marnee Spring M.D.   On: 05/14/2020 07:08    Procedures Procedures (including critical care time)  Medications Ordered in UC Medications - No data to display  Initial Impression / Assessment and Plan / UC Course  I have reviewed the triage vital signs and the nursing notes.  Pertinent labs & imaging results that were available during my care of the patient were reviewed by me and considered in my medical decision making (see chart for details).     Suspect both vaginal infection and viral URI causing her constellation of sxs today. COVID pcr and aptima swab pending, U/A without obvious evidence of UTI. WIll start prednisone for her cough with phenergan DM prn, await vaginal swab for direction on treatment of those sxs. Abstain from sexual contact until these results return. Strict return precautions for worsening sxs.   Final Clinical Impressions(s) / UC Diagnoses   Final diagnoses:  Generalized body aches  Viral URI with cough  Acute vaginitis   Discharge Instructions   None    ED Prescriptions    Medication Sig Dispense Auth. Provider   predniSONE (DELTASONE) 20 MG tablet Take 2 tablets (40 mg total) by mouth daily with breakfast. 10 tablet Particia Nearing, PA-C   promethazine-dextromethorphan (PROMETHAZINE-DM) 6.25-15 MG/5ML syrup Take 5 mLs by mouth 4 (four) times daily as  needed for cough. 100 mL Particia Nearing, [place]     PDMP not reviewed this encounter.   Particia Nearing, [place] 05/14/20 1940

## 2020-05-15 LAB — URINE CULTURE: Culture: NO GROWTH

## 2020-05-15 LAB — GC/CHLAMYDIA PROBE AMP (~~LOC~~) NOT AT ARMC
Chlamydia: POSITIVE — AB
Comment: NEGATIVE
Comment: NORMAL
Neisseria Gonorrhea: NEGATIVE

## 2020-08-08 ENCOUNTER — Ambulatory Visit
Admission: EM | Admit: 2020-08-08 | Discharge: 2020-08-08 | Disposition: A | Discharge status: Home / Self Care | Payer: Medicaid Other | Attending: Emergency Medicine | Admitting: Emergency Medicine

## 2020-08-08 ENCOUNTER — Other Ambulatory Visit: Payer: Self-pay

## 2020-08-08 DIAGNOSIS — R591 Generalized enlarged lymph nodes: Secondary | ICD-10-CM | POA: Diagnosis not present

## 2020-08-08 DIAGNOSIS — J020 Streptococcal pharyngitis: Secondary | ICD-10-CM

## 2020-08-08 LAB — POCT RAPID STREP A (OFFICE): Rapid Strep A Screen: POSITIVE — AB

## 2020-08-08 MED ORDER — AMOXICILLIN 500 MG PO CAPS: 500.0000 mg | ORAL_CAPSULE | Freq: Two times a day (BID) | ORAL | 0 refills | Status: AC

## 2020-08-08 MED ORDER — IBUPROFEN 800 MG PO TABS: 800.0000 mg | ORAL_TABLET | Freq: Three times a day (TID) | ORAL | 0 refills | Status: DC

## 2020-08-08 NOTE — Discharge Instructions (Signed)

## 2020-08-08 NOTE — ED Provider Notes (Signed)
[facility]    CSN: [id number] Arrival date & time: 08/08/20  1324      History   Chief Complaint Chief Complaint  Patient presents with  . Sore Throat    HPI Carol Hendrix is a 20 y.o. female history of asthma presenting today for evaluation of sore throat.  Reports associated sore throat and swollen lymph nodes.  Pain with swallowing.  Reports symptoms began approximately 1 week ago, sore throat has slightly improved, but over the past few days has developed increased lymph node swelling to her neck.  She is unsure of fevers.  Does report history of prior mono.  Denies significant cough or congestion.  Children here with URI symptoms as well.  HPI  Past Medical History:  Diagnosis Date  . Anemia   . Asthma     Patient Active Problem List   Diagnosis Date Noted  . [redacted] weeks gestation of pregnancy 08/21/2019  . Spontaneous vaginal delivery 08/21/2019  . Precipitous delivery   . Retained complete placenta   . Postpartum state 08/01/2018  . Indication for care in labor or delivery 07/31/2018  . [redacted] weeks gestation of pregnancy 03/25/2018  . Moderate persistent asthma 03/25/2018  . Tobacco use disorder 03/25/2018    Past Surgical History:  Procedure Laterality Date  . NO PAST SURGERIES      OB History    Gravida  2   Para  2   Term  2   Preterm  0   AB  0   Living  2     SAB  0   IAB  0   Ectopic  0   Multiple  0   Live Births  2            Home Medications    Prior to Admission medications   Medication Sig Start Date End Date Taking? Authorizing Provider  amoxicillin (AMOXIL) 500 MG capsule Take 1 capsule (500 mg total) by mouth 2 (two) times daily for 10 days. 08/08/20 08/18/20 Yes Adea Geisel C, PA-C  ibuprofen (ADVIL) 800 MG tablet Take 1 tablet (800 mg total) by mouth 3 (three) times daily. 08/08/20  Yes Jaemarie Hochberg C, PA-C  acetaminophen (TYLENOL) 500 MG tablet Take 1,000 mg by mouth every 6 (six) hours as needed for  moderate pain.    [provider]  albuterol (VENTOLIN HFA) 108 (90 Base) MCG/ACT inhaler Inhale 1-2 puffs into the lungs every 6 (six) hours as needed for wheezing or shortness of breath. 12/30/19   Henderly, Britni A, PA-C  metroNIDAZOLE (FLAGYL) 500 MG tablet Take 1 tablet (500 mg total) by mouth 2 (two) times daily. 05/14/20   Hyman Hopes, Margaux, PA-C  naproxen (NAPROSYN) 375 MG tablet Take 1 tablet (375 mg total) by mouth 2 (two) times daily. 05/14/20   [name], PA-C  predniSONE (DELTASONE) 20 MG tablet Take 2 tablets (40 mg total) by mouth daily with breakfast. 05/13/20   Particia Nearing, PA-C  promethazine-dextromethorphan (PROMETHAZINE-DM) 6.25-15 MG/5ML syrup Take 5 mLs by mouth 4 (four) times daily as needed for cough. 05/13/20   Particia Nearing, PA-C  ipratropium (ATROVENT) 0.03 % nasal spray Place 2 sprays into both nostrils 2 (two) times daily. Patient not taking: Reported on 07/31/2018 06/21/18 11/13/18  Bing Neighbors, FNP    Family History Family History  Problem Relation Age of Onset  . Healthy Mother   . Healthy Father   . Pulmonary fibrosis Maternal Grandmother  Social History Social History   Tobacco Use  . Smoking status: Former Smoker    Packs/day: 0.25    Types: Cigarettes  . Smokeless tobacco: Never Used  . Tobacco comment: rolls marijuana in with tobacco  Vaping Use  . Vaping Use: Never used  Substance Use Topics  . Alcohol use: Yes    Comment: social   . Drug use: Yes    Types: Marijuana    Comment: last smoked 03/19/2019     Allergies   Patient has no known allergies.   Review of Systems Review of Systems  Constitutional: Negative for activity change, appetite change, chills, fatigue and fever.  HENT: Positive for sore throat. Negative for congestion, ear pain, rhinorrhea, sinus pressure and trouble swallowing.   Eyes: Negative for discharge and redness.  Respiratory: Negative for cough, chest tightness and shortness  of breath.   Cardiovascular: Negative for chest pain.  Gastrointestinal: Negative for abdominal pain, diarrhea, nausea and vomiting.  Musculoskeletal: Negative for myalgias.  Skin: Negative for rash.  Neurological: Negative for dizziness, light-headedness and headaches.  Hematological: Positive for adenopathy.     Physical Exam Triage Vital Signs ED Triage Vitals  Enc Vitals Group     BP      Pulse      Resp      Temp      Temp src      SpO2      Weight      Height      Head Circumference      Peak Flow      Pain Score      Pain Loc      Pain Edu?      Excl. in GC?    No data found.  Updated Vital Signs BP 114/60 (BP Location: Left Arm)   Pulse 83   Temp 97.8 F (36.6 C) (Oral)   Resp 16   LMP 08/05/2020   SpO2 98%   Visual Acuity Right Eye Distance:   Left Eye Distance:   Bilateral Distance:    Right Eye Near:   Left Eye Near:    Bilateral Near:     Physical Exam Vitals and nursing note reviewed.  Constitutional:      Appearance: She is well-developed.     Comments: No acute distress  HENT:     Head: Normocephalic and atraumatic.     Ears:     Comments: Bilateral ears without tenderness to palpation of external auricle, tragus and mastoid, EAC's without erythema or swelling, TM's with good bony landmarks and cone of light. Non erythematous.     Nose: Nose normal.     Mouth/Throat:     Comments: Oral mucosa pink and moist, bilateral tonsils appear enlarged, mildly erythematous without exudate.  Posterior pharynx patent and nonerythematous, no uvula deviation or swelling. Normal phonation. Eyes:     Conjunctiva/sclera: Conjunctivae normal.  Neck:     Comments: Significant bilateral anterior cervical lymphadenopathy Cardiovascular:     Rate and Rhythm: Normal rate.  Pulmonary:     Effort: Pulmonary effort is normal. No respiratory distress.     Comments: Breathing comfortably at rest, CTABL, no wheezing, rales or other adventitious sounds  auscultated Abdominal:     General: There is no distension.  Musculoskeletal:        General: Normal range of motion.     Cervical back: Neck supple.  Skin:    General: Skin is warm and dry.  Neurological:  Mental Status: She is alert and oriented to person, place, and time.      UC Treatments / Results  Labs (all labs ordered are listed, but only abnormal results are displayed) Labs Reviewed  POCT RAPID STREP A (OFFICE) - Abnormal; Notable for the following components:      Result Value   Rapid Strep A Screen Positive (*)    All other components within normal limits    EKG   Radiology No results found.  Procedures Procedures (including critical care time)  Medications Ordered in UC Medications - No data to display  Initial Impression / Assessment and Plan / UC Course  I have reviewed the triage vital signs and the nursing notes.  Pertinent labs & imaging results that were available during my care of the patient were reviewed by me and considered in my medical decision making (see chart for details).     Strep positive, treated with amoxicillin, discussed symptomatic and supportive care as well.  Monitor for resolution of lymphadenopathy with treatment of strep.  Warm compresses.  Discussed strict return precautions. Patient verbalized understanding and is agreeable with plan.  Final Clinical Impressions(s) / UC Diagnoses   Final diagnoses:  Streptococcal sore throat  Lymphadenopathy     Discharge Instructions     Sore Throat  Your rapid strep test was positive today. We will treat you for strep throat with an antibiotic. Please take Amoxicillin as prescribed.   Please continue Tylenol or Ibuprofen for fever and pain. May try salt water gargles, cepacol lozenges, throat spray, or OTC cold relief medicine for throat discomfort. If you also have congestion take a daily anti-histamine like Zyrtec, Claritin, and a oral decongestant to help with post nasal  drip that may be irritating your throat.   Stay hydrated and drink plenty of fluids to keep your throat coated relieve irritation.     ED Prescriptions    Medication Sig Dispense Auth. Provider   amoxicillin (AMOXIL) 500 MG capsule Take 1 capsule (500 mg total) by mouth 2 (two) times daily for 10 days. 20 capsule Carl Butner C, PA-C   ibuprofen (ADVIL) 800 MG tablet Take 1 tablet (800 mg total) by mouth 3 (three) times daily. 21 tablet Kirrah Mustin, [place] C, PA-C     PDMP not reviewed this encounter.   Lew Dawes, [place] 08/09/20 [phone]

## 2020-08-08 NOTE — ED Triage Notes (Signed)
Pt present sore throat with swollen lymph nodes and knots around neck. Pt states she is having difficulty swallowing.

## 2020-11-02 ENCOUNTER — Ambulatory Visit: Payer: Medicaid Other

## 2021-02-02 ENCOUNTER — Ambulatory Visit
Admission: EM | Admit: 2021-02-02 | Discharge: 2021-02-02 | Disposition: A | Discharge status: Home / Self Care | Payer: Medicaid Other | Attending: Physician Assistant | Admitting: Physician Assistant

## 2021-02-02 DIAGNOSIS — J069 Acute upper respiratory infection, unspecified: Secondary | ICD-10-CM

## 2021-02-02 DIAGNOSIS — J029 Acute pharyngitis, unspecified: Secondary | ICD-10-CM | POA: Diagnosis not present

## 2021-02-02 DIAGNOSIS — J020 Streptococcal pharyngitis: Secondary | ICD-10-CM

## 2021-02-02 LAB — POCT RAPID STREP A (OFFICE): Rapid Strep A Screen: POSITIVE — AB

## 2021-02-02 MED ORDER — PROMETHAZINE-DM 6.25-15 MG/5ML PO SYRP: 5.0000 mL | ORAL_SOLUTION | Freq: Four times a day (QID) | ORAL | 0 refills | Status: DC | PRN

## 2021-02-02 MED ORDER — AMOXICILLIN 500 MG PO CAPS: 500.0000 mg | ORAL_CAPSULE | Freq: Three times a day (TID) | ORAL | 0 refills | Status: DC

## 2021-02-02 NOTE — ED Provider Notes (Signed)
[facility]    CSN: [id number] Arrival date & time: 02/02/21  1622      History   Chief Complaint Chief Complaint  Patient presents with   Sore Throat    HPI Carol Hendrix is a 20 y.o. female.   Patient here today for evaluation of significant sore throat and cough that started yesterday night.  She states that she has not had any known fever.  She denies any abdominal pain, nausea, vomiting or diarrhea.  She reports that her throat has caused difficulty swallowing.  Cough makes throat pain worse.  She has tried taking over-the-counter medications without significant relief.  The history is provided by the patient.  Sore Throat Pertinent negatives include no shortness of breath.   Past Medical History:  Diagnosis Date   Anemia    Asthma     Patient Active Problem List   Diagnosis Date Noted   [redacted] weeks gestation of pregnancy 08/21/2019   Spontaneous vaginal delivery 08/21/2019   Precipitous delivery    Retained complete placenta    Postpartum state 08/01/2018   Indication for care in labor or delivery 07/31/2018   [redacted] weeks gestation of pregnancy 03/25/2018   Moderate persistent asthma 03/25/2018   Tobacco use disorder 03/25/2018    Past Surgical History:  Procedure Laterality Date   NO PAST SURGERIES      OB History     Gravida  2   Para  2   Term  2   Preterm  0   AB  0   Living  2      SAB  0   IAB  0   Ectopic  0   Multiple  0   Live Births  2            Home Medications    Prior to Admission medications   Medication Sig Start Date End Date Taking? Authorizing Provider  acetaminophen (TYLENOL) 500 MG tablet Take 1,000 mg by mouth every 6 (six) hours as needed for moderate pain.    [provider]  albuterol (VENTOLIN HFA) 108 (90 Base) MCG/ACT inhaler Inhale 1-2 puffs into the lungs every 6 (six) hours as needed for wheezing or shortness of breath. 12/30/19   Henderly, Britni A, PA-C  ibuprofen (ADVIL) 800  MG tablet Take 1 tablet (800 mg total) by mouth 3 (three) times daily. 08/08/20   Wieters, Hallie C, PA-C  metroNIDAZOLE (FLAGYL) 500 MG tablet Take 1 tablet (500 mg total) by mouth 2 (two) times daily. 05/14/20   Hyman Hopes, Margaux, PA-C  naproxen (NAPROSYN) 375 MG tablet Take 1 tablet (375 mg total) by mouth 2 (two) times daily. 05/14/20   [name], PA-C  predniSONE (DELTASONE) 20 MG tablet Take 2 tablets (40 mg total) by mouth daily with breakfast. 05/13/20   Particia Nearing, PA-C  promethazine-dextromethorphan (PROMETHAZINE-DM) 6.25-15 MG/5ML syrup Take 5 mLs by mouth 4 (four) times daily as needed for cough. 05/13/20   Particia Nearing, PA-C  ipratropium (ATROVENT) 0.03 % nasal spray Place 2 sprays into both nostrils 2 (two) times daily. Patient not taking: Reported on 07/31/2018 06/21/18 11/13/18  Carol Neighbors, FNP    Family History Family History  Problem Relation Age of Onset   Healthy Mother    Healthy Father    Pulmonary fibrosis Maternal Grandmother     Social History Social History   Tobacco Use   Smoking status: Former    Packs/day: 0.25  Types: Cigarettes   Smokeless tobacco: Never   Tobacco comments:    rolls marijuana in with tobacco  Vaping Use   Vaping Use: Never used  Substance Use Topics   Alcohol use: Yes    Comment: social    Drug use: Yes    Types: Marijuana    Comment: last smoked 03/19/2019     Allergies   Patient has no known allergies.   Review of Systems Review of Systems  Constitutional:  Negative for chills and fever.  HENT:  Positive for congestion and sore throat. Negative for ear pain.   Eyes:  Negative for discharge and redness.  Respiratory:  Positive for cough. Negative for shortness of breath.   Gastrointestinal:  Negative for diarrhea, nausea and vomiting.    Physical Exam Triage Vital Signs ED Triage Vitals [02/02/21 1703]  Enc Vitals Group     BP 122/81     Pulse Rate 96     Resp 18     Temp 98.8 F  (37.1 C)     Temp Source Oral     SpO2 98 %     Weight      Height      Head Circumference      Peak Flow      Pain Score 8     Pain Loc      Pain Edu?      Excl. in GC?    No data found.  Updated Vital Signs BP 122/81 (BP Location: Left Arm)   Pulse 96   Temp 98.8 F (37.1 C) (Oral)   Resp 18   SpO2 98%   Breastfeeding No   Physical Exam Vitals and nursing note reviewed.  Constitutional:      General: She is not in acute distress.    Appearance: Normal appearance. She is well-developed. She is not ill-appearing.  HENT:     Head: Normocephalic and atraumatic.     Nose: Nose normal.     Mouth/Throat:     Mouth: Mucous membranes are moist.     Pharynx: Oropharyngeal exudate and posterior oropharyngeal erythema present.  Eyes:     Conjunctiva/sclera: Conjunctivae normal.  Cardiovascular:     Rate and Rhythm: Normal rate and regular rhythm.     Heart sounds: Normal heart sounds. No murmur heard. Pulmonary:     Effort: Pulmonary effort is normal.     Breath sounds: Normal breath sounds. No wheezing, rhonchi or rales.  Neurological:     Mental Status: She is alert.  Psychiatric:        Mood and Affect: Mood normal.        Behavior: Behavior normal.     UC Treatments / Results  Labs (all labs ordered are listed, but only abnormal results are displayed) Labs Reviewed  COVID-19, FLU A+B AND RSV  POCT RAPID STREP A (OFFICE)    EKG   Radiology No results found.  Procedures Procedures (including critical care time)  Medications Ordered in UC Medications - No data to display  Initial Impression / Assessment and Plan / UC Course  I have reviewed the triage vital signs and the nursing notes.  Pertinent labs & imaging results that were available during my care of the patient were reviewed by me and considered in my medical decision making (see chart for details).  Strep test positive.  Will treat with amoxicillin.  We will also screen for COVID flu and RSV  given cough and known RSV exposure.  Patient requested cough medication to be called in to pharmacy.  Promethazine DM also prescribed.  Encouraged follow-up with any further concerns or symptoms worsen.  Recommended emergency department evaluation if she has any worsening swelling of her tonsils, more difficulty swallowing etc.  Final Clinical Impressions(s) / UC Diagnoses   Final diagnoses:  Acute pharyngitis, unspecified etiology  Acute upper respiratory infection   Discharge Instructions   None    ED Prescriptions   None    PDMP not reviewed this encounter.   Tomi Bamberger, PA-C 02/02/21 [phone]

## 2021-02-02 NOTE — ED Triage Notes (Signed)
Pt c/o sore throat, cough, and states she viewed her tonsils at home and they are edematous. Also states child at home is rsv(+). Onset yesterday.

## 2021-02-04 LAB — COVID-19, FLU A+B AND RSV
Influenza A, NAA: NOT DETECTED
Influenza B, NAA: NOT DETECTED
RSV, NAA: NOT DETECTED
SARS-CoV-2, NAA: NOT DETECTED

## 2021-02-15 ENCOUNTER — Ambulatory Visit: Payer: Medicaid Other

## 2021-03-25 DIAGNOSIS — Z419 Encounter for procedure for purposes other than remedying health state, unspecified: Secondary | ICD-10-CM | POA: Diagnosis not present

## 2021-04-25 DIAGNOSIS — Z419 Encounter for procedure for purposes other than remedying health state, unspecified: Secondary | ICD-10-CM | POA: Diagnosis not present

## 2021-04-25 NOTE — L&D Delivery Note (Signed)
Patient: Carol Hendrix MRN: [id number]  GBS status: neg  Patient is a 21 y.o. now [id number] s/p NSVD at [redacted]w[redacted]d, who was admitted for SOL s/p PPROM @ 27 weeks. SROM with clear fluid.      Kensie, Susman [[id number]]  Delivery Note At 7:45 AM a viable female was delivered via Vaginal, Spontaneous (Presentation: Left Occiput Anterior).  APGAR: 8, 9; weight pending .   Placenta status: Spontaneous, Intact.  Cord: 3 vessels with the following complications: None.    Baby A Head delivered LOA. Nuchal cord present and delivered through. Shoulder and body delivered in usual fashion. Infant with spontaneous cry, placed on mother's abdomen, dried and bulb suctioned. Cord clamped x 2 after 1-minute delay, and cut by family member. Cord blood drawn.  Cord clamped until delivery of baby B and placentas delivered together spontaneously.     Valley, Ke [[id number]]  Delivery Note At 7:50 AM a viable female was delivered via Vaginal, Spontaneous (Presentation:  footling breech    ).  APGAR: 8, 9; weight  pending.   Placenta status: Spontaneous, Intact.  Cord: 3 vessels with the following complications: None.    Baby B Delivered by attending Dr. Debroah Loop. Footling breech delivery.  Infant with spontaneous cry, placed on mother's abdomen, dried and bulb suctioned. Cord clamped x 2 after 1-minute delay, and cut by family member. Cord blood drawn. Placentas delivered spontaneously with gentle cord traction. Fundus firm with massage and Pitocin. Perineum inspected and found to have no laceration.  Anesthesia: Epidural Episiotomy: None Lacerations:  None Est. Blood Loss (mL): 89  Mom to postpartum.  Baby to NICU.  Cyndi Lennert Kalaya Infantino 12/29/2021, 8:07 AM

## 2021-05-26 DIAGNOSIS — Z419 Encounter for procedure for purposes other than remedying health state, unspecified: Secondary | ICD-10-CM | POA: Diagnosis not present

## 2021-06-18 DIAGNOSIS — Z3201 Encounter for pregnancy test, result positive: Secondary | ICD-10-CM | POA: Diagnosis not present

## 2021-06-23 DIAGNOSIS — Z419 Encounter for procedure for purposes other than remedying health state, unspecified: Secondary | ICD-10-CM | POA: Diagnosis not present

## 2021-06-29 ENCOUNTER — Ambulatory Visit: Payer: Medicaid Other

## 2021-07-16 ENCOUNTER — Other Ambulatory Visit: Payer: Self-pay

## 2021-07-16 ENCOUNTER — Encounter (HOSPITAL_COMMUNITY): Payer: Self-pay | Admitting: Obstetrics & Gynecology

## 2021-07-16 ENCOUNTER — Inpatient Hospital Stay (HOSPITAL_COMMUNITY)
Admission: AD | Admit: 2021-07-16 | Discharge: 2021-07-16 | Disposition: A | Discharge status: Home / Self Care | Payer: Medicaid Other | Attending: Obstetrics & Gynecology | Admitting: Obstetrics & Gynecology

## 2021-07-16 DIAGNOSIS — O23591 Infection of other part of genital tract in pregnancy, first trimester: Secondary | ICD-10-CM | POA: Diagnosis not present

## 2021-07-16 DIAGNOSIS — A5900 Urogenital trichomoniasis, unspecified: Secondary | ICD-10-CM | POA: Insufficient documentation

## 2021-07-16 DIAGNOSIS — Z3A08 8 weeks gestation of pregnancy: Secondary | ICD-10-CM

## 2021-07-16 DIAGNOSIS — Z3A09 9 weeks gestation of pregnancy: Secondary | ICD-10-CM | POA: Insufficient documentation

## 2021-07-16 DIAGNOSIS — R55 Syncope and collapse: Secondary | ICD-10-CM

## 2021-07-16 DIAGNOSIS — O99511 Diseases of the respiratory system complicating pregnancy, first trimester: Secondary | ICD-10-CM | POA: Diagnosis not present

## 2021-07-16 DIAGNOSIS — O30041 Twin pregnancy, dichorionic/diamniotic, first trimester: Secondary | ICD-10-CM | POA: Insufficient documentation

## 2021-07-16 DIAGNOSIS — Q894 Conjoined twins: Secondary | ICD-10-CM

## 2021-07-16 LAB — URINALYSIS, ROUTINE W REFLEX MICROSCOPIC
Bilirubin Urine: NEGATIVE
Glucose, UA: NEGATIVE mg/dL
Hgb urine dipstick: NEGATIVE
Ketones, ur: NEGATIVE mg/dL
Nitrite: NEGATIVE
Protein, ur: NEGATIVE mg/dL
Specific Gravity, Urine: 1.015 (ref 1.005–1.030)
WBC, UA: >50 WBC/hpf — ABNORMAL HIGH (ref 0–5)
pH: 6 (ref 5.0–8.0)

## 2021-07-16 LAB — CBC
HCT: 35.5 % — ABNORMAL LOW (ref 36.0–46.0)
Hemoglobin: 11.4 g/dL — ABNORMAL LOW (ref 12.0–15.0)
MCH: 25.8 pg — ABNORMAL LOW (ref 26.0–34.0)
MCHC: 32.1 g/dL (ref 30.0–36.0)
MCV: 80.3 fL (ref 80.0–100.0)
Platelets: 237 10*3/uL (ref 150–400)
RBC: 4.42 MIL/uL (ref 3.87–5.11)
RDW: 17.8 % — ABNORMAL HIGH (ref 11.5–15.5)
WBC: 8.9 10*3/uL (ref 4.0–10.5)
nRBC: 0 % (ref 0.0–0.2)

## 2021-07-16 LAB — COMPREHENSIVE METABOLIC PANEL
ALT: 10 U/L (ref 0–44)
AST: 15 U/L (ref 15–41)
Albumin: 3.6 g/dL (ref 3.5–5.0)
Alkaline Phosphatase: 44 U/L (ref 38–126)
Anion gap: 8 (ref 5–15)
BUN: <5 mg/dL — ABNORMAL LOW (ref 6–20)
CO2: 23 mmol/L (ref 22–32)
Calcium: 8.9 mg/dL (ref 8.9–10.3)
Chloride: 104 mmol/L (ref 98–111)
Creatinine, Ser: 0.54 mg/dL (ref 0.44–1.00)
GFR, Estimated: >60 mL/min (ref >60)
Glucose, Bld: 87 mg/dL (ref 70–99)
Potassium: 3.5 mmol/L (ref 3.5–5.1)
Sodium: 135 mmol/L (ref 135–145)
Total Bilirubin: 0.3 mg/dL (ref 0.3–1.2)
Total Protein: 6.9 g/dL (ref 6.5–8.1)

## 2021-07-16 LAB — TSH: TSH: 0.047 u[IU]/mL — ABNORMAL LOW (ref 0.350–4.500)

## 2021-07-16 MED ORDER — OXYCODONE HCL 5 MG PO TABS: 5.0000 mg | ORAL_TABLET | ORAL | 0 refills | Status: AC | PRN

## 2021-07-16 MED ORDER — LACTATED RINGERS IV BOLUS
1000.0000 mL | Freq: Once | INTRAVENOUS | Status: AC
  Administered 2021-07-16: 1000 mL via INTRAVENOUS

## 2021-07-16 MED ORDER — PROMETHAZINE HCL 12.5 MG PO TABS: 12.5000 mg | ORAL_TABLET | Freq: Four times a day (QID) | ORAL | 0 refills | Status: DC | PRN

## 2021-07-16 MED ORDER — METRONIDAZOLE 500 MG PO TABS
2000.0000 mg | ORAL_TABLET | Freq: Once | ORAL | Status: AC
  Administered 2021-07-16: 2000 mg via ORAL
  Filled 2021-07-16: qty 4

## 2021-07-16 NOTE — Discharge Instructions (Signed)
?  ?  Dental Resources   [place]   Dr. Carl Little  Exam $85   [address]  Extraction $120 and up   [place], [place]  *full list of prices available*   [phone]      [facility]  Exam $94   [address]  Exam w/ Xrays $380   [place], [place]  Xrays $68 and up   [phone]  Cleaning $101   Extraction $190 and up      [facility]  Cleaning + Xray $344   [address]  Extraction- pt has to be seen first to give price   [place], [place]   [phone]     [place]   Dr. Ben Turner/Dr. Clay Burton  Exam, Cleaning, Xray $262   [address]  Extraction $207-$317   [place]   [phone]      [facility]  Cleaning $5   [address]  Xray $5   [place], [place] [address]  Call to get on waiting list   [phone] ext [phone]     Dr. James McMasters/Dr. Eric Sadler   [address]  Xray $85 Each   [place], [place] [address]  Extraction $200    Dr. Stacey Green  Extraction $300 per tooth   [address]   [place], [place] [address]   [phone]      Dr. Janna Civils  Cleaning $300   [address]  Extraction $273   , [place] [address]   [phone]     [place]   [facility]  Emergency Exam $65   [address]  Cleaning & Exam $150   [place],  [address]  Extractions: Simple $180 Surgical $250   [phone]  Fillings $150-$225     

## 2021-07-16 NOTE — MAU Provider Note (Signed)
?History  ?  ? ?CSN: [id number] ? ?Arrival date and time: 07/16/21 1640 ? ? Event Date/Time  ? First Provider Initiated Contact with Patient 07/16/21 1810   ?  ? ?Chief Complaint  ?Patient presents with  ? Dizziness  ? ?HPI ?Carol Hendrix is a 21 y.o. G3P2002 at [redacted]w[redacted]d with di/di twins who presents to MAU for evaluation of syncope. Patient entered [employer] yesterday 07/15/2021, felt hot and experienced a syncopal event. Her sister assisted her to the floor. She did not hit her head. She states the EMS crew that responded told her she lost consciousness a total of three times. Patient states she could hear her sister's voice throughout this episode but could not open her eyes or move her limbs. She reports a similar episode during her 2021 pregnancy. She denies chest pain, palpitations.  ? ?Patient endorses recurrent poor appetite. She has eaten a fast food hamburger patty today. She endorses a "crumbling" tooth with severe cavity on the bottom left side of her mouth. She does not believe she has an abscess but states she can feel her tooth cracking and the pain is exquisite. This is also impacting her ability to eat. ? ?She is asymptomatic on arrival to MAU. She denies any pregnancy-related concerns. ? ?OB History   ? ? Gravida  ?3  ? Para  ?2  ? Term  ?2  ? Preterm  ?0  ? AB  ?0  ? Living  ?2  ?  ? ? SAB  ?0  ? IAB  ?0  ? Ectopic  ?0  ? Multiple  ?0  ? Live Births  ?2  ?   ?  ?  ? ? ?Past Medical History:  ?Diagnosis Date  ? Anemia   ? Asthma   ? ? ?Past Surgical History:  ?Procedure Laterality Date  ? NO PAST SURGERIES    ? ? ?Family History  ?Problem Relation Age of Onset  ? Healthy Mother   ? Healthy Father   ? Pulmonary fibrosis Maternal Grandmother   ? ? ?Social History  ? ?Tobacco Use  ? Smoking status: Former  ?  Packs/day: 0.25  ?  Types: Cigarettes  ? Smokeless tobacco: Never  ? Tobacco comments:  ?  rolls marijuana in with tobacco  ?Vaping Use  ? Vaping Use: Never used  ?Substance Use Topics  ? Alcohol use:  Yes  ?  Comment: social   ? Drug use: Yes  ?  Types: Marijuana  ?  Comment: last smoked 03/19/2019  ? ? ?Allergies: No Known Allergies ? ?Medications Prior to Admission  ?Medication Sig Dispense Refill Last Dose  ? acetaminophen (TYLENOL) 500 MG tablet Take 1,000 mg by mouth every 6 (six) hours as needed for moderate pain.     ? albuterol (VENTOLIN HFA) 108 (90 Base) MCG/ACT inhaler Inhale 1-2 puffs into the lungs every 6 (six) hours as needed for wheezing or shortness of breath. 8 g 0   ? amoxicillin (AMOXIL) 500 MG capsule Take 1 capsule (500 mg total) by mouth 3 (three) times daily. 21 capsule 0   ? ibuprofen (ADVIL) 800 MG tablet Take 1 tablet (800 mg total) by mouth 3 (three) times daily. 21 tablet 0   ? metroNIDAZOLE (FLAGYL) 500 MG tablet Take 1 tablet (500 mg total) by mouth 2 (two) times daily. 14 tablet 0   ? naproxen (NAPROSYN) 375 MG tablet Take 1 tablet (375 mg total) by mouth 2 (two) times daily. 20 tablet 0   ?  predniSONE (DELTASONE) 20 MG tablet Take 2 tablets (40 mg total) by mouth daily with breakfast. 10 tablet 0   ? promethazine-dextromethorphan (PROMETHAZINE-DM) 6.25-15 MG/5ML syrup Take 5 mLs by mouth 4 (four) times daily as needed for cough. 118 mL 0   ? ? ?Review of Systems  ?Constitutional:  Positive for fatigue.  ?All other systems reviewed and are negative. ?Physical Exam  ? ?Blood pressure (!) 120/92, pulse 65, temperature 98 ?F (36.7 ?C), temperature source Oral, resp. rate 16, height 5\' 5"  (1.651 m), weight 60.1 kg, last menstrual period 05/15/2021, SpO2 100 %, not currently breastfeeding. ? ?Physical Exam ?Vitals and nursing note reviewed. Exam conducted with a chaperone present.  ?Constitutional:   ?   Appearance: Normal appearance.  ?Cardiovascular:  ?   Rate and Rhythm: Normal rate.  ?   Pulses: Normal pulses.  ?   Heart sounds: Normal heart sounds.  ?Pulmonary:  ?   Effort: Pulmonary effort is normal.  ?   Breath sounds: Normal breath sounds.  ?Abdominal:  ?   General: Abdomen is  flat.  ?   Tenderness: There is no abdominal tenderness.  ?Skin: ?   Capillary Refill: Capillary refill takes less than 2 seconds.  ?Neurological:  ?   Mental Status: She is alert and oriented to person, place, and time.  ?Psychiatric:     ?   Mood and Affect: Mood normal.     ?   Behavior: Behavior normal.     ?   Thought Content: Thought content normal.     ?   Judgment: Judgment normal.  ? ? ?MAU Course  ?Procedures ? ?MDM ? ?--1845: CNM returned to bedside. Discussed Trichomonas diagnosis. Advised treatment now in MAU. Encouraged treatment of any partners ASAP, no intercourse until ten days after all partners have been treated, condom use for remainder of pregnancy to reduce risk of reinfection. ? ?--1936: Telephone consult with Cardiology. ECG reviewed. No additional interventions indicated but agrees with my plan to order Amb referral to [place] team ? ?--Live di/di twins visible on bedside ? ?Patient Vitals for the past 24 hrs: ? BP Temp Temp src Pulse Resp SpO2 Height Weight  ?07/16/21 1843 112/64 -- -- (!) 59 -- 99 % -- --  ?07/16/21 1714 (!) 120/92 98 ?F (36.7 ?C) Oral 65 16 100 % -- --  ?07/16/21 1710 -- -- -- -- -- -- 5\' 5"  (1.651 m) 60.1 kg  ? ? ?Orders Placed This Encounter  ?Procedures  ? Culture, OB Urine  ? Urinalysis, Routine w reflex microscopic Urine, Clean Catch  ? CBC  ? Comprehensive metabolic panel  ? TSH  ? ED EKG  ? Insert peripheral IV  ? ?Results for orders placed or performed during the hospital encounter of 07/16/21 (from the past 24 hour(s))  ?Urinalysis, Routine w reflex microscopic Urine, Clean Catch     Status: Abnormal  ? Collection Time: 07/16/21  4:40 PM  ?Result Value Ref Range  ? Color, Urine YELLOW YELLOW  ? APPearance HAZY (A) CLEAR  ? Specific Gravity, Urine 1.015 1.005 - 1.030  ? pH 6.0 5.0 - 8.0  ? Glucose, UA NEGATIVE NEGATIVE mg/dL  ? Hgb urine dipstick NEGATIVE NEGATIVE  ? Bilirubin Urine NEGATIVE NEGATIVE  ? Ketones, ur NEGATIVE NEGATIVE mg/dL  ? Protein, ur  NEGATIVE NEGATIVE mg/dL  ? Nitrite NEGATIVE NEGATIVE  ? Leukocytes,Ua LARGE (A) NEGATIVE  ? RBC / HPF 6-10 0 - 5 RBC/hpf  ? WBC, UA >50 (H) 0 -  5 WBC/hpf  ? Bacteria, UA MANY (A) NONE SEEN  ? Squamous Epithelial / LPF 0-5 0 - 5  ? WBC Clumps PRESENT   ? Mucus PRESENT   ? Trichomonas, UA PRESENT (A) NONE SEEN  ? Budding Yeast PRESENT   ?CBC     Status: Abnormal  ? Collection Time: 07/16/21  6:17 PM  ?Result Value Ref Range  ? WBC 8.9 4.0 - 10.5 K/uL  ? RBC 4.42 3.87 - 5.11 MIL/uL  ? Hemoglobin 11.4 (L) 12.0 - 15.0 g/dL  ? HCT 35.5 (L) 36.0 - 46.0 %  ? MCV 80.3 80.0 - 100.0 fL  ? MCH 25.8 (L) 26.0 - 34.0 pg  ? MCHC 32.1 30.0 - 36.0 g/dL  ? RDW 17.8 (H) 11.5 - 15.5 %  ? Platelets 237 150 - 400 K/uL  ? nRBC 0.0 0.0 - 0.2 %  ?Comprehensive metabolic panel     Status: Abnormal  ? Collection Time: 07/16/21  6:17 PM  ?Result Value Ref Range  ? Sodium 135 135 - 145 mmol/L  ? Potassium 3.5 3.5 - 5.1 mmol/L  ? Chloride 104 98 - 111 mmol/L  ? CO2 23 22 - 32 mmol/L  ? Glucose, Bld 87 70 - 99 mg/dL  ? BUN <5 (L) 6 - 20 mg/dL  ? Creatinine, Ser 0.54 0.44 - 1.00 mg/dL  ? Calcium 8.9 8.9 - 10.3 mg/dL  ? Total Protein 6.9 6.5 - 8.1 g/dL  ? Albumin 3.6 3.5 - 5.0 g/dL  ? AST 15 15 - 41 U/L  ? ALT 10 0 - 44 U/L  ? Alkaline Phosphatase 44 38 - 126 U/L  ? Total Bilirubin 0.3 0.3 - 1.2 mg/dL  ? GFR, Estimated >60 >60 mL/min  ? Anion gap 8 5 - 15  ? ? ?Meds ordered this encounter  ?Medications  ? lactated ringers bolus 1,000 mL  ? metroNIDAZOLE (FLAGYL) tablet 2,000 mg  ? oxyCODONE (ROXICODONE) 5 MG immediate release tablet  ?  Sig: Take 1 tablet (5 mg total) by mouth every 4 (four) hours as needed for up to 2 days for breakthrough pain or severe pain.  ?  Dispense:  12 tablet  ?  Refill:  0  ?  Order Specific Question:   Supervising Provider  ?  AnswerRadene Gunning [id number]  ? promethazine (PHENERGAN) 12.5 MG tablet  ?  Sig: Take 1 tablet (12.5 mg total) by mouth every 6 (six) hours as needed for nausea or vomiting.  ?  Dispense:  30  tablet  ?  Refill:  0  ?  Order Specific Question:   Supervising Provider  ?  Answer:   Woodroe Mode [id number]  ? ? ?Assessment and Plan  ?-21 y.o. [id number] at [redacted]w[redacted]d with di/di twins ?--Cardiac flicker x 2

## 2021-07-16 NOTE — MAU Note (Signed)
Carol Hendrix is a 21 y.o. at [redacted]w[redacted]d here in MAU reporting: has been feeling dizzy and lightheaded. [place] [employer] and had a syncopal episode. EMS was called and states was told BP was low, did not go to the hospital.  ? ?[facility] and has copy of u/s pictures on her phone, confirmed pt name in the pictures. 2 IUP seen. ? ?LMP: 05/15/21 ? ?Onset of complaint: ongoing ? ?Pain score: 0/10 ? ?Vitals:  ? 07/16/21 1714  ?BP: (!) 120/92  ?Pulse: 65  ?Resp: 16  ?Temp: 98 ?F (36.7 ?C)  ?SpO2: 100%  ?   ?Lab orders placed from triage: UA ? ?

## 2021-07-17 LAB — CULTURE, OB URINE

## 2021-07-21 ENCOUNTER — Ambulatory Visit: Payer: Medicaid Other

## 2021-07-21 DIAGNOSIS — Z3480 Encounter for supervision of other normal pregnancy, unspecified trimester: Secondary | ICD-10-CM | POA: Insufficient documentation

## 2021-07-21 DIAGNOSIS — O0990 Supervision of high risk pregnancy, unspecified, unspecified trimester: Secondary | ICD-10-CM | POA: Insufficient documentation

## 2021-07-21 NOTE — Progress Notes (Signed)
Called x 2 for NOB Intake on requested number [phone], no answer, no vm available. ?

## 2021-07-24 DIAGNOSIS — Z419 Encounter for procedure for purposes other than remedying health state, unspecified: Secondary | ICD-10-CM | POA: Diagnosis not present

## 2021-08-06 ENCOUNTER — Encounter: Payer: Self-pay | Admitting: Cardiology

## 2021-08-06 ENCOUNTER — Ambulatory Visit (INDEPENDENT_AMBULATORY_CARE_PROVIDER_SITE_OTHER): Payer: Medicaid Other | Admitting: Cardiology

## 2021-08-06 ENCOUNTER — Ambulatory Visit: Payer: Medicaid Other

## 2021-08-06 DIAGNOSIS — R55 Syncope and collapse: Secondary | ICD-10-CM | POA: Diagnosis not present

## 2021-08-06 DIAGNOSIS — R002 Palpitations: Secondary | ICD-10-CM

## 2021-08-06 NOTE — Progress Notes (Signed)
?Cardio[facility]Obst[facility]s Clinic ? ?New Evaluation ? ?Date:  08/06/2021  ? ?ID:  Carol Hendrix, DOB 18-May-2000, MRN [id number] ? ?PCP:  Bing Neighbors, FNP ?  ?[facility] Providers ?Cardiologist:  None  ?Electrophysiologist:  None      ? ?Referring MD: Bing Neighbors, FNP  ? ?Chief Complaint:  ? ?History of Present Illness:   ? ?Carol Hendrix is a 21 y.o. female [G3P2002] who is being seen today for the evaluation of syncope at the request of Bing Neighbors, FNP.  ? ?She is currently 11 weeks and 6 days pregnant.  The patient was referred to the cardio OB clinic because she had had a syncope episode.  She tells me that [place] [employer] few weeks ago when she started to feel palpitations and then over time she passed out.  By the time EMS got there she felt better she decided that she was not going to go with the EMS team.  Few days later she had another episode she ended up at the emergency department.  Her work-up was normal.  She was asked to see cardiology. ? ?She admits to intermittent palpitations.  Sometimes lightheadedness.  No chest pain. ? ?Prior CV Studies Reviewed: ?The following studies were reviewed today: ?None today  ? ? ?Past Medical History:  ?Diagnosis Date  ? Anemia   ? Asthma   ? ? ?Past Surgical History:  ?Procedure Laterality Date  ? NO PAST SURGERIES    ?   ? ?OB History   ? ? Gravida  ?3  ? Para  ?2  ? Term  ?2  ? Preterm  ?0  ? AB  ?0  ? Living  ?2  ?  ? ? SAB  ?0  ? IAB  ?0  ? Ectopic  ?0  ? Multiple  ?0  ? Live Births  ?2  ?   ?  ?  ?    ? ? ?Current Medications: ?Current Meds  ?Medication Sig  ? acetaminophen (TYLENOL) 500 MG tablet Take 1,000 mg by mouth every 6 (six) hours as needed for moderate pain.  ? albuterol (VENTOLIN HFA) 108 (90 Base) MCG/ACT inhaler Inhale 1-2 puffs into the lungs every 6 (six) hours as needed for wheezing or shortness of breath.  ? Prenatal Vit-Fe Fumarate-FA (PRENATAL VITAMIN PO) Take by mouth daily.  ? promethazine (PHENERGAN) 12.5 MG tablet Take 1  tablet (12.5 mg total) by mouth every 6 (six) hours as needed for nausea or vomiting.  ?  ? ?Allergies:   Patient has no known allergies.  ? ?Social History  ? ?Socioeconomic History  ? Marital status: Single  ?  Spouse name: Not on file  ? Number of children: Not on file  ? Years of education: Not on file  ? Highest education level: Not on file  ?Occupational History  ? Not on file  ?Tobacco Use  ? Smoking status: Former  ?  Packs/day: 0.25  ?  Types: Cigarettes  ? Smokeless tobacco: Never  ? Tobacco comments:  ?  rolls marijuana in with tobacco  ?Vaping Use  ? Vaping Use: Never used  ?Substance and Sexual Activity  ? Alcohol use: Not Currently  ? Drug use: Not Currently  ?  Types: Marijuana  ?  Comment: last smoked marijuana one year ago as of 07/16/2021  ? Sexual activity: Yes  ?Other Topics Concern  ? Not on file  ?Social History Narrative  ? Not on file  ? ?Social  Determinants of Health  ? ?Financial Resource Strain: Not on file  ?Food Insecurity: Not on file  ?Transportation Needs: No Transportation Needs  ? Lack of Transportation (Medical): No  ? Lack of Transportation (Non-Medical): No  ?Physical Activity: Not on file  ?Stress: Not on file  ?Social Connections: Not on file  ?  ? ? ?Family History  ?Problem Relation Age of Onset  ? Healthy Mother   ? Healthy Father   ? Pulmonary fibrosis Maternal Grandmother   ?   ? ?ROS:   ?Please see the history of present illness.    ? ?All other systems reviewed and are negative. ? ? ?Labs/EKG Reviewed:   ? ?EKG:   ?EKG is was ordered today.  The ekg ordered today demonstrates  ? ?Recent Labs: ?07/16/2021: ALT 10; BUN <5; Creatinine, Ser 0.54; Hemoglobin 11.4; Platelets 237; Potassium 3.5; Sodium 135; TSH 0.047  ? ?Recent Lipid Panel ?No results found for: CHOL, TRIG, HDL, CHOLHDL, LDLCALC, LDLDIRECT ? ?Physical Exam:   ? ?VS:  BP 114/76 (BP Location: Right Arm, Patient Position: Sitting, Cuff Size: Normal)   Pulse (!) 108   Ht 5\' 5"  (1.651 m)   Wt 131 lb 3.2 oz (59.5  kg)   LMP 05/15/2021   SpO2 97%   BMI 21.83 kg/m?    ? ?Wt Readings from Last 3 Encounters:  ?08/06/21 131 lb 3.2 oz (59.5 kg)  ?07/16/21 132 lb 9.6 oz (60.1 kg)  ?05/13/20 134 lb (60.8 kg)  ?  ? ?GEN:  Well nourished, well developed in no acute distress ?HEENT: Normal ?NECK: No JVD; No carotid bruits ?LYMPHATICS: No lymphadenopathy ?CARDIAC: RRR, no murmurs, rubs, gallops ?RESPIRATORY:  Clear to auscultation without rales, wheezing or rhonchi  ?ABDOMEN: Soft, non-tender, non-distended ?MUSCULOSKELETAL:  No edema; No deformity  ?SKIN: Warm and dry, skin with tattoos ?NEUROLOGIC:  Alert and oriented x 3 ?PSYCHIATRIC:  Normal affect  ? ? ?Risk Assessment/Risk Calculators:   ?  ?CARPREG II ?Risk Prediction Index Score:  1.  The patient's risk for a primary cardiac event is 5%. ?  ?   ?  ?  ? ? ?ASSESSMENT & PLAN:   ? ?Syncope ?I would like to rule out a cardiovascular etiology of this syncope, therefore at this time I would like to placed a zio patch for  7  days. In additon a transthoracic echocardiogram will be ordered to assess LV/RV function and any structural abnormalities. Once these testing have been performed amd reviewed further reccomendations will be made. For now, I do reccomend that the patient goes to the nearest ED if  symptoms recur. ? ? ?Patient Instructions  ?Medication Instructions:  ?Your physician recommends that you continue on your current medications as directed. Please refer to the Current Medication list given to you today.  ?*If you need a refill on your cardiac medications before your next appointment, please call your pharmacy* ? ? ?Lab Work: ?NONE ?If you have labs (blood work) drawn today and your tests are completely normal, you will receive your results only by: ?MyChart Message (if you have MyChart) OR ?A paper copy in the mail ?If you have any lab test that is abnormal or we need to change your treatment, we will call you to review the results. ? ? ?Testing/Procedures: ?Your  physician has requested that you have an echocardiogram. Echocardiography is a painless test that uses sound waves to create images of your heart. It provides your doctor with information about the size and shape of  your heart and how well your heart?s chambers and valves are working. This procedure takes approximately one hour. There are no restrictions for this procedure. ? ?ZIO XT- Long Term Monitor Instructions ? ?Your physician has requested you wear a ZIO patch monitor for 7 days.  ?This is a single patch monitor. [employer] supplies one patch monitor per enrollment. Additional ?stickers are not available. Please do not apply patch if you will be having a Nuclear Stress Test,  ?Echocardiogram, Cardiac CT, MRI, or Chest Xray during the period you would be wearing the  ?monitor. The patch cannot be worn during these tests. You cannot remove and re-apply the  ?ZIO XT patch monitor.  ?Your ZIO patch monitor will be mailed 3 day USPS to your address on file. It may take 3-5 days  ?to receive your monitor after you have been enrolled.  ?Once you have received your monitor, please review the enclosed instructions. Your monitor  ?has already been registered assigning a specific monitor serial # to you. ? ?Billing and Patient Assistance Program Information ? ?We have supplied [employer] with any of your insurance information on file for billing purposes. ?[employer] offers a sliding scale Patient Assistance Program for patients that do not have  ?insurance, or whose insurance does not completely cover the cost of the ZIO monitor.  ?You must apply for the Patient Assistance Program to qualify for this discounted rate.  ?To apply, please call [employer] at [phone], select option 4, select option 2, ask to apply for  ?Patient Assistance Program. Meredeth [employer] will ask your household income, and how many people  ?are in your household. They will quote your out-of-pocket cost based on that information.  ?[employer] will also be able to set  up a 10731-month, interest-free payment plan if needed. ? ?Applying the monitor ? ?Shave hair from upper left chest.  ?Hold abrader disc by orange tab. Rub abrader in 40 strokes over the upper left chest as  ?indicate

## 2021-08-06 NOTE — Patient Instructions (Signed)
Medication Instructions:  ?Your physician recommends that you continue on your current medications as directed. Please refer to the Current Medication list given to you today.  ?*If you need a refill on your cardiac medications before your next appointment, please call your pharmacy* ? ? ?Lab Work: ?NONE ?If you have labs (blood work) drawn today and your tests are completely normal, you will receive your results only by: ?MyChart Message (if you have MyChart) OR ?A paper copy in the mail ?If you have any lab test that is abnormal or we need to change your treatment, we will call you to review the results. ? ? ?Testing/Procedures: ?Your physician has requested that you have an echocardiogram. Echocardiography is a painless test that uses sound waves to create images of your heart. It provides your doctor with information about the size and shape of your heart and how well your heart?s chambers and valves are working. This procedure takes approximately one hour. There are no restrictions for this procedure. ? ?ZIO XT- Long Term Monitor Instructions ? ?Your physician has requested you wear a ZIO patch monitor for 7 days.  ?This is a single patch monitor. [employer] supplies one patch monitor per enrollment. Additional ?stickers are not available. Please do not apply patch if you will be having a Nuclear Stress Test,  ?Echocardiogram, Cardiac CT, MRI, or Chest Xray during the period you would be wearing the  ?monitor. The patch cannot be worn during these tests. You cannot remove and re-apply the  ?ZIO XT patch monitor.  ?Your ZIO patch monitor will be mailed 3 day USPS to your address on file. It may take 3-5 days  ?to receive your monitor after you have been enrolled.  ?Once you have received your monitor, please review the enclosed instructions. Your monitor  ?has already been registered assigning a specific monitor serial # to you. ? ?Billing and Patient Assistance Program Information ? ?We have supplied [employer] with  any of your insurance information on file for billing purposes. ?[employer] offers a sliding scale Patient Assistance Program for patients that do not have  ?insurance, or whose insurance does not completely cover the cost of the ZIO monitor.  ?You must apply for the Patient Assistance Program to qualify for this discounted rate.  ?To apply, please call [employer] at [phone], select option 4, select option 2, ask to apply for  ?Patient Assistance Program. Theodore Demark will ask your household income, and how many people  ?are in your household. They will quote your out-of-pocket cost based on that information.  ?[employer] will also be able to set up a 70-month, interest-free payment plan if needed. ? ?Applying the monitor ? ?Shave hair from upper left chest.  ?Hold abrader disc by orange tab. Rub abrader in 40 strokes over the upper left chest as  ?indicated in your monitor instructions.  ?Clean area with 4 enclosed alcohol pads. Let dry.  ?Apply patch as indicated in monitor instructions. Patch will be placed under collarbone on left  ?side of chest with arrow pointing upward.  ?Rub patch adhesive wings for 2 minutes. Remove white label marked "1". Remove the white  ?label marked "2". Rub patch adhesive wings for 2 additional minutes.  ?While looking in a mirror, press and release button in center of patch. A small green light will  ?flash 3-4 times. This will be your only indicator that the monitor has been turned on.  ?Do not shower for the first 24 hours. You may shower after the first 24  hours.  ?Press the button if you feel a symptom. You will hear a small click. Record Date, Time and  ?Symptom in the Patient Logbook.  ?When you are ready to remove the patch, follow instructions on the last 2 pages of Patient  ?Logbook. Stick patch monitor onto the last page of Patient Logbook.  ?Place Patient Logbook in the blue and white box. Use locking tab on box and tape box closed  ?securely. The blue and white box has prepaid  postage on it. Please place it in the mailbox as  ?soon as possible. Your physician should have your test results approximately 7 days after the  ?monitor has been mailed back to [facility].  ?Call [facility] at [phone] if you have questions regarding  ?your ZIO XT patch monitor. Call them immediately if you see an orange light blinking on your  ?monitor.  ?If your monitor falls off in less than 4 days, contact our Monitor department at [phone].  ?If your monitor becomes loose or falls off after 4 days call [employer] at [phone] for  ?suggestions on securing your monitor ? ? ? ?Follow-Up: ?At [facility], you and your health needs are our priority.  As part of our continuing mission to provide you with exceptional heart care, we have created designated Provider Care Teams.  These Care Teams include your primary Cardiologist (physician) and Advanced Practice Providers (APPs -  Physician Assistants and Nurse Practitioners) who all work together to provide you with the care you need, when you need it. ? ?We recommend signing up for the patient portal called "MyChart".  Sign up information is provided on this After Visit Summary.  MyChart is used to connect with patients for Virtual Visits (Telemedicine).  Patients are able to view lab/test results, encounter notes, upcoming appointments, etc.  Non-urgent messages can be sent to your provider as well.   ?To learn more about what you can do with MyChart, go to [email or web address].   ? ?Your next appointment:   ?12 week(s) ? ?The format for your next appointment:   ?In Person ? ?Provider:   ?{Kardie Tobb  ?[facility] ?[address], [place], [place] [address] ? ? ?Important Information About Sugar ? ? ? ? ?  ?

## 2021-08-06 NOTE — Progress Notes (Unsigned)
Enrolled for [employer] to mail a ZIO XT long term holter monitor to the patients address on file.  

## 2021-08-07 ENCOUNTER — Inpatient Hospital Stay (HOSPITAL_COMMUNITY)
Admission: AD | Admit: 2021-08-07 | Discharge: 2021-08-07 | Disposition: A | Discharge status: Home / Self Care | Payer: Medicaid Other | Attending: Obstetrics and Gynecology | Admitting: Obstetrics and Gynecology

## 2021-08-07 ENCOUNTER — Inpatient Hospital Stay (HOSPITAL_COMMUNITY): Payer: Medicaid Other

## 2021-08-07 DIAGNOSIS — B3731 Acute candidiasis of vulva and vagina: Secondary | ICD-10-CM | POA: Diagnosis not present

## 2021-08-07 DIAGNOSIS — O30041 Twin pregnancy, dichorionic/diamniotic, first trimester: Secondary | ICD-10-CM

## 2021-08-07 DIAGNOSIS — Z3A12 12 weeks gestation of pregnancy: Secondary | ICD-10-CM | POA: Insufficient documentation

## 2021-08-07 DIAGNOSIS — R109 Unspecified abdominal pain: Secondary | ICD-10-CM | POA: Diagnosis not present

## 2021-08-07 DIAGNOSIS — O23592 Infection of other part of genital tract in pregnancy, second trimester: Secondary | ICD-10-CM | POA: Diagnosis not present

## 2021-08-07 DIAGNOSIS — O98811 Other maternal infectious and parasitic diseases complicating pregnancy, first trimester: Secondary | ICD-10-CM | POA: Diagnosis not present

## 2021-08-07 DIAGNOSIS — O209 Hemorrhage in early pregnancy, unspecified: Secondary | ICD-10-CM | POA: Insufficient documentation

## 2021-08-07 DIAGNOSIS — O26891 Other specified pregnancy related conditions, first trimester: Secondary | ICD-10-CM | POA: Insufficient documentation

## 2021-08-07 DIAGNOSIS — Z6790 Unspecified blood type, Rh positive: Secondary | ICD-10-CM | POA: Insufficient documentation

## 2021-08-07 MED ORDER — TERCONAZOLE 0.4 % VA CREA: 1.0000 | TOPICAL_CREAM | Freq: Every day | VAGINAL | 0 refills | Status: DC

## 2021-08-07 NOTE — MAU Note (Signed)
.  Carol Hendrix is a 21 y.o. at [redacted]w[redacted]d here in MAU reporting vag bleeding tonight that was "running" when she sat down on the toilet. No pain. No recent intercourse.  ?LMP: [facility] 02/19/22 ?Onset of complaint: tonight ?Pain score: 0 ?Vitals:  ? 08/07/21 0127  ?BP: 119/71  ?Pulse: 94  ?Resp: 17  ?Temp: 98.5 ?F (36.9 ?C)  ?SpO2: 100%  ?   ?FHT:pt has twins so did not doppler FHTs in Triage ?Lab orders placed from triage:   ? ?

## 2021-08-07 NOTE — MAU Note (Signed)
Patient access called stating the pt is requesting to be informed on how much longer her wait would be. CNM had placed an order for u/s. RN paged u/s. RN went to lobby and explained to pt that there was an order put in for an ultrasound and that they were called; pt informed that as soon as the u/s tech was ready we would take her to get her u/s completed. RN apologized to the pt for the long wait time. Pt verbalized understanding and is okay to wait for u/s.  ? ?0623 - pt to u/s  ?

## 2021-08-07 NOTE — MAU Provider Note (Signed)
Chief Complaint: Vaginal Bleeding ? ? Event Date/Time  ? First Provider Initiated Contact with Patient 08/07/21 [phone]   ?  ? ?SUBJECTIVE ?HPI: Carol Hendrix is a 21 y.o. G3P2002 at [redacted]w[redacted]d with di/di twins pregnancy who presents to maternity admissions reporting an episode of light red bleeding last night.  She was recently treated for trichomonas and her s/o was treated as well.  She now has some thick white vaginal discharge and itching and reports it feels like a yeast infection.   ? ?HPI ? ?Past Medical History:  ?Diagnosis Date  ? Anemia   ? Asthma   ? ?Past Surgical History:  ?Procedure Laterality Date  ? NO PAST SURGERIES    ? ?Social History  ? ?Socioeconomic History  ? Marital status: Single  ?  Spouse name: Not on file  ? Number of children: Not on file  ? Years of education: Not on file  ? Highest education level: Not on file  ?Occupational History  ? Not on file  ?Tobacco Use  ? Smoking status: Former  ?  Packs/day: 0.25  ?  Types: Cigarettes  ? Smokeless tobacco: Never  ? Tobacco comments:  ?  rolls marijuana in with tobacco  ?Vaping Use  ? Vaping Use: Never used  ?Substance and Sexual Activity  ? Alcohol use: Not Currently  ? Drug use: Not Currently  ?  Types: Marijuana  ?  Comment: last smoked marijuana one year ago as of 07/16/2021  ? Sexual activity: Yes  ?Other Topics Concern  ? Not on file  ?Social History Narrative  ? Not on file  ? ?Social Determinants of Health  ? ?Financial Resource Strain: Not on file  ?Food Insecurity: Not on file  ?Transportation Needs: No Transportation Needs  ? Lack of Transportation (Medical): No  ? Lack of Transportation (Non-Medical): No  ?Physical Activity: Not on file  ?Stress: Not on file  ?Social Connections: Not on file  ?Intimate Partner Violence: Not on file  ? ?No current facility-administered medications on file prior to encounter.  ? ?Current Outpatient Medications on File Prior to Encounter  ?Medication Sig Dispense Refill  ? acetaminophen (TYLENOL) 500 MG  tablet Take 1,000 mg by mouth every 6 (six) hours as needed for moderate pain.    ? albuterol (VENTOLIN HFA) 108 (90 Base) MCG/ACT inhaler Inhale 1-2 puffs into the lungs every 6 (six) hours as needed for wheezing or shortness of breath. 8 g 0  ? Prenatal Vit-Fe Fumarate-FA (PRENATAL VITAMIN PO) Take by mouth daily.    ? promethazine (PHENERGAN) 12.5 MG tablet Take 1 tablet (12.5 mg total) by mouth every 6 (six) hours as needed for nausea or vomiting. 30 tablet 0  ? [DISCONTINUED] ipratropium (ATROVENT) 0.03 % nasal spray Place 2 sprays into both nostrils 2 (two) times daily. (Patient not taking: Reported on 07/31/2018) 30 mL 0  ? ?No Known Allergies ? ?ROS:  ?Review of Systems  ?Constitutional:  Negative for chills, fatigue and fever.  ?Respiratory:  Negative for shortness of breath.   ?Cardiovascular:  Negative for chest pain.  ?Gastrointestinal:  Negative for abdominal pain.  ?Genitourinary:  Positive for vaginal bleeding. Negative for difficulty urinating, dysuria, flank pain, pelvic pain, vaginal discharge and vaginal pain.  ?Neurological:  Negative for dizziness and headaches.  ?Psychiatric/Behavioral: Negative.    ? ? ?I have reviewed patient's Past Medical Hx, Surgical Hx, Family Hx, Social Hx, medications and allergies.  ? ?Physical Exam  ?Patient Vitals for the past 24 hrs: ?  BP Temp Pulse Resp SpO2 Height Weight  ?08/07/21 0127 119/71 98.5 ?F (36.9 ?C) 94 17 100 % 5\' 5"  (1.651 m) 59.9 kg  ? ?Constitutional: Well-developed, well-nourished female in no acute distress.  ?Cardiovascular: normal rate ?Respiratory: normal effort ?GI: Abd soft, non-tender. Pos BS x 4 ?MS: Extremities nontender, no edema, normal ROM ?Neurologic: Alert and oriented x 4.  ?GU: Neg CVAT. ? ? ?LAB RESULTS ?No results found for this or any previous visit (from the past 24 hour(s)). ? ?  ? ?IMAGING ? OB Comp Less 14 Wks ? ?Result Date: 08/07/2021 ?CLINICAL DATA:  21 year old pregnant female presenting with abdominal pain. EXAM: TWIN  OBSTETRICAL ULTRASOUND <14 WKS TECHNIQUE: Transabdominal ultrasound was performed for evaluation of the gestation as well as the maternal uterus and adnexal regions. COMPARISON:  No priors. FINDINGS: Number of IUPs:  2 Chorionicity/Amnionicity:  Dichorionic-diamniotic (thick membrane) TWIN 1 Yolk sac:  None Embryo:  Present Cardiac Activity: Present Heart Rate: 162 bpm CRL:   54.6 mm   12 w 0 d                  21 EDC: 02/19/2022 TWIN 2 Yolk sac:  None Embryo:  Present Cardiac Activity: Present Heart Rate: 180 bpm CRL:   50.9 mm   11 w 5 d                  02/21/2022 EDC: 02/21/2022 Subchorionic hemorrhage:  None visualized. Maternal uterus/adnexae: Uterus and right ovary are otherwise unremarkable in appearance. Left ovary not confidently visualized. No significant volume of free fluid in the cul-de-sac. IMPRESSION: 1. Viable dichorionic-diamniotic twin pregnancy with no acute findings, as detailed above. Electronically Signed   By: 02/23/2022 M.D.   On: 08/07/2021 06:54  ? ?08/09/2021 OB Comp AddL Gest Less 14 Wks ? ?Result Date: 08/07/2021 ?CLINICAL DATA:  21 year old pregnant female presenting with abdominal pain. EXAM: TWIN OBSTETRICAL ULTRASOUND <14 WKS TECHNIQUE: Transabdominal ultrasound was performed for evaluation of the gestation as well as the maternal uterus and adnexal regions. COMPARISON:  No priors. FINDINGS: Number of IUPs:  2 Chorionicity/Amnionicity:  Dichorionic-diamniotic (thick membrane) TWIN 1 Yolk sac:  None Embryo:  Present Cardiac Activity: Present Heart Rate: 162 bpm CRL:   54.6 mm   12 w 0 d                  21 EDC: 02/19/2022 TWIN 2 Yolk sac:  None Embryo:  Present Cardiac Activity: Present Heart Rate: 180 bpm CRL:   50.9 mm   11 w 5 d                  02/21/2022 EDC: 02/21/2022 Subchorionic hemorrhage:  None visualized. Maternal uterus/adnexae: Uterus and right ovary are otherwise unremarkable in appearance. Left ovary not confidently visualized. No significant volume of free fluid in the cul-de-sac.  IMPRESSION: 1. Viable dichorionic-diamniotic twin pregnancy with no acute findings, as detailed above. Electronically Signed   By: 02/23/2022 M.D.   On: 08/07/2021 06:54   ? ?MAU Management/MDM: ?Orders Placed This Encounter  ?Procedures  ? 08/09/2021 OB Comp Less 14 Wks  ? US OB Comp AddL Gest Less 14 Wks  ? Discharge patient  ?  ?Meds ordered this encounter  ?Medications  ? terconazole (TERAZOL 7) 0.4 % vaginal cream  ?  Sig: Place 1 applicator vaginally at bedtime.  ?  Dispense:  45 g  ?  Refill:  0  ?  Order Specific Question:  Supervising Provider  ?  Answer:   Warden FillersBASS, LAWRENCE A [[id number]]  ?  ?US with IUP x 2 with normal FHR.  Pt with s/sx of vaginal yeast infection and recently completed course of abx for STI.  Rx for Terazol 7 sent to pharmacy. Pt to f/u with OB/Gyn on Monday as scheduled, return to MAU as needed for emergencies.   ? ?ASSESSMENT ?1. Vaginal bleeding in pregnancy, first trimester   ?2. Dichorionic diamniotic twin pregnancy in first trimester   ?3. Vaginal candidiasis   ?4. [redacted] weeks gestation of pregnancy   ?5. Blood type, Rh positive   ? ? ?PLAN ?Discharge home ?Allergies as of 08/07/2021   ?No Known Allergies ?  ? ?  ?Medication List  ?  ? ?TAKE these medications   ? ?acetaminophen 500 MG tablet ?Commonly known as: TYLENOL ?Take 1,000 mg by mouth every 6 (six) hours as needed for moderate pain. ?  ?albuterol 108 (90 Base) MCG/ACT inhaler ?Commonly known as: VENTOLIN HFA ?Inhale 1-2 puffs into the lungs every 6 (six) hours as needed for wheezing or shortness of breath. ?  ?PRENATAL VITAMIN PO ?Take by mouth daily. ?  ?promethazine 12.5 MG tablet ?Commonly known as: PHENERGAN ?Take 1 tablet (12.5 mg total) by mouth every 6 (six) hours as needed for nausea or vomiting. ?  ?terconazole 0.4 % vaginal cream ?Commonly known as: TERAZOL 7 ?Place 1 applicator vaginally at bedtime. ?  ? ?  ? ? Follow-up Information   ? ? [facility] Follow up.   ?Why: As scheduled ?Contact  information: ?[address] ?[address] ?[place] [address] ?[phone] ? ? ?  ?  ? ? [facility] Follow up.   ?Specialty: Obstetrics and Gynecology ?Why: As needed for emergencies ?Contact in

## 2021-08-11 ENCOUNTER — Ambulatory Visit (INDEPENDENT_AMBULATORY_CARE_PROVIDER_SITE_OTHER): Payer: Medicaid Other | Admitting: Obstetrics and Gynecology

## 2021-08-11 ENCOUNTER — Other Ambulatory Visit (HOSPITAL_COMMUNITY)
Admission: RE | Admit: 2021-08-11 | Discharge: 2021-08-11 | Disposition: A | Discharge status: Home / Self Care | Payer: Medicaid Other | Source: Ambulatory Visit | Attending: Obstetrics and Gynecology | Admitting: Obstetrics and Gynecology

## 2021-08-11 ENCOUNTER — Encounter: Payer: Self-pay | Admitting: Obstetrics and Gynecology

## 2021-08-11 DIAGNOSIS — O30041 Twin pregnancy, dichorionic/diamniotic, first trimester: Secondary | ICD-10-CM | POA: Diagnosis not present

## 2021-08-11 DIAGNOSIS — Z3A12 12 weeks gestation of pregnancy: Secondary | ICD-10-CM | POA: Insufficient documentation

## 2021-08-11 DIAGNOSIS — Z3480 Encounter for supervision of other normal pregnancy, unspecified trimester: Secondary | ICD-10-CM

## 2021-08-11 DIAGNOSIS — O98811 Other maternal infectious and parasitic diseases complicating pregnancy, first trimester: Secondary | ICD-10-CM | POA: Diagnosis not present

## 2021-08-11 DIAGNOSIS — O30049 Twin pregnancy, dichorionic/diamniotic, unspecified trimester: Secondary | ICD-10-CM | POA: Insufficient documentation

## 2021-08-11 DIAGNOSIS — R55 Syncope and collapse: Secondary | ICD-10-CM

## 2021-08-11 DIAGNOSIS — Z113 Encounter for screening for infections with a predominantly sexual mode of transmission: Secondary | ICD-10-CM | POA: Diagnosis not present

## 2021-08-11 DIAGNOSIS — Z3009 Encounter for other general counseling and advice on contraception: Secondary | ICD-10-CM

## 2021-08-11 DIAGNOSIS — O09891 Supervision of other high risk pregnancies, first trimester: Secondary | ICD-10-CM | POA: Insufficient documentation

## 2021-08-11 DIAGNOSIS — Z131 Encounter for screening for diabetes mellitus: Secondary | ICD-10-CM | POA: Diagnosis not present

## 2021-08-11 DIAGNOSIS — Z3482 Encounter for supervision of other normal pregnancy, second trimester: Secondary | ICD-10-CM | POA: Diagnosis not present

## 2021-08-11 MED ORDER — ASPIRIN EC 81 MG PO TBEC: 81.0000 mg | DELAYED_RELEASE_TABLET | Freq: Every day | ORAL | 2 refills | Status: DC

## 2021-08-11 MED ORDER — ONDANSETRON 4 MG PO TBDP: 4.0000 mg | ORAL_TABLET | Freq: Four times a day (QID) | ORAL | 1 refills | Status: DC | PRN

## 2021-08-11 NOTE — Progress Notes (Signed)
0

## 2021-08-11 NOTE — Progress Notes (Signed)
Subjective:  ?Carol Hendrix is a 21 y.o. G3P2002 at [redacted]w[redacted]d being seen today for her first OB visit. DI/Di twins confirmed by U/S. H/O TSVD x 2 without problems. H/O syncope a few weeks back.  She is currently monitored for the following issues for this high-risk pregnancy and has Moderate persistent asthma; Tobacco use disorder; Supervision of other normal pregnancy, antepartum; Twin pregnancy, twins dichorionic and diamniotic; and Syncope on their problem list. ? ?Patient reports nausea.  Contractions: Not present. Vag. Bleeding: Scant.   . Denies leaking of fluid.  ? ?The following portions of the patient's history were reviewed and updated as appropriate: allergies, current medications, past family history, past medical history, past social history, past surgical history and problem list. Problem list updated. ? ?Objective:  ? ?Vitals:  ? 08/11/21 1004  ?BP: 114/65  ?Pulse: 82  ?Weight: 132 lb 4.8 oz (60 kg)  ? ? ?Fetal Status: Fetal Heart Rate (bpm): A&B present [place]        ? ?General:  Alert, oriented and cooperative. Patient is in no acute distress.  ?Skin: Skin is warm and dry. No rash noted.   ?Cardiovascular: Normal heart rate noted  ?Respiratory: Normal respiratory effort, no problems with respiration noted  ?Abdomen: Soft, gravid, appropriate for gestational age. Pain/Pressure: Present     ?Pelvic:  Cervical exam performed        ?Extremities: Normal range of motion.     ?Mental Status: Normal mood and affect. Normal behavior. Normal judgment and thought content.  ? ?Urinalysis:     ? ?Assessment and Plan:  ?Pregnancy: [id number] at [redacted]w[redacted]d ? ?1. Supervision of other normal pregnancy, antepartum ?Prenatal labs and care reviewed with pt. ?Genetic testing reviewed ?- Cytology - PAP ?- CBC/D/Plt+RPR+Rh+ABO+RubIgG... ?- Culture, OB Urine ?- Genetic Screening ?- Cervicovaginal ancillary only( [place]) ?- Comp Met (CMET) ?- Protein / creatinine ratio, urine ?- Hemoglobin A1c ?- ondansetron (ZOFRAN-ODT) 4 MG  disintegrating tablet; Take 1 tablet (4 mg total) by mouth every 6 (six) hours as needed for nausea.  Dispense: 20 tablet; Refill: 1 ? ?2. Dichorionic diamniotic twin pregnancy in first trimester ?Twins gestation reviewed with pt. ?Potential risks associated with twins reviewed, including but not limited to PTL. PTD, PEC, GDM, operative vag del and c section ?Additional U/S as necessary reviewed ?Will start qd BASA ?- Cytology - PAP ?- CBC/D/Plt+RPR+Rh+ABO+RubIgG... ?- Culture, OB Urine ?- Genetic Screening ?- Cervicovaginal ancillary only( [place]) ?- Comp Met (CMET) ?- Protein / creatinine ratio, urine ?- Hemoglobin A1c ? ?3. Syncope, unspecified syncope type ?Has seen OB cards and has follow up appt ? ?Preterm labor symptoms and general obstetric precautions including but not limited to vaginal bleeding, contractions, leaking of fluid and fetal movement were reviewed in detail with the patient. ?Please refer to After Visit Summary for other counseling recommendations.  ?Return in about 4 weeks (around 09/08/2021) for OB visit, face to face, MD only. ? ? ?Chancy Milroy, MD ?

## 2021-08-11 NOTE — Patient Instructions (Signed)
First Trimester of Pregnancy  The first trimester of pregnancy starts on the first day of your last menstrual period until the end of week 12. This is months 1 through 3 of pregnancy. A week after a sperm fertilizes an egg, the egg will implant into the wall of the uterus and begin to develop into a baby. By the end of 12 weeks, all the baby's organs will be formed and the baby will be 2-3 inches in size. Body changes during your first trimester Your body goes through many changes during pregnancy. The changes vary and generally return to normal after your baby is born. Physical changes You may gain or lose weight. Your breasts may begin to grow larger and become tender. The tissue that surrounds your nipples (areola) may become darker. Dark spots or blotches (chloasma or mask of pregnancy) may develop on your face. You may have changes in your hair. These can include thickening or thinning of your hair or changes in texture. Health changes You may feel nauseous, and you may vomit. You may have heartburn. You may develop headaches. You may develop constipation. Your gums may bleed and may be sensitive to brushing and flossing. Other changes You may tire easily. You may urinate more often. Your menstrual periods will stop. You may have a loss of appetite. You may develop cravings for certain kinds of food. You may have changes in your emotions from day to day. You may have more vivid and strange dreams. Follow these instructions at home: Medicines Follow your health care provider's instructions regarding medicine use. Specific medicines may be either safe or unsafe to take during pregnancy. Do not take any medicines unless told to by your health care provider. Take a prenatal vitamin that contains at least 600 micrograms (mcg) of folic acid. Eating and drinking Eat a healthy diet that includes fresh fruits and vegetables, whole grains, good sources of protein such as meat, eggs, or tofu,  and low-fat dairy products. Avoid raw meat and unpasteurized juice, milk, and cheese. These carry germs that can harm you and your baby. If you feel nauseous or you vomit: Eat 4 or 5 small meals a day instead of 3 large meals. Try eating a few soda crackers. Drink liquids between meals instead of during meals. You may need to take these actions to prevent or treat constipation: Drink enough fluid to keep your urine pale yellow. Eat foods that are high in fiber, such as beans, whole grains, and fresh fruits and vegetables. Limit foods that are high in fat and processed sugars, such as fried or sweet foods. Activity Exercise only as directed by your health care provider. Most people can continue their usual exercise routine during pregnancy. Try to exercise for 30 minutes at least 5 days a week. Stop exercising if you develop pain or cramping in the lower abdomen or lower back. Avoid exercising if it is very hot or humid or if you are at high altitude. Avoid heavy lifting. If you choose to, you may have sex unless your health care provider tells you not to. Relieving pain and discomfort Wear a good support bra to relieve breast tenderness. Rest with your legs elevated if you have leg cramps or low back pain. If you develop bulging veins (varicose veins) in your legs: Wear support hose as told by your health care provider. Elevate your feet for 15 minutes, 3-4 times a day. Limit salt in your diet. Safety Wear your seat belt at all times when   driving or riding in a car. Talk with your health care provider if someone is verbally or physically abusive to you. Talk with your health care provider if you are feeling sad or have thoughts of hurting yourself. Lifestyle Do not use hot tubs, steam rooms, or saunas. Do not douche. Do not use tampons or scented sanitary pads. Do not use herbal remedies, alcohol, illegal drugs, or medicines that are not approved by your health care provider. Chemicals  in these products can harm your baby. Do not use any products that contain nicotine or tobacco, such as cigarettes, e-cigarettes, and chewing tobacco. If you need help quitting, ask your health care provider. Avoid cat litter boxes and soil used by cats. These carry germs that can cause birth defects in the baby and possibly loss of the unborn baby (fetus) by miscarriage or stillbirth. General instructions During routine prenatal visits in the first trimester, your health care provider will do a physical exam, perform necessary tests, and ask you how things are going. Keep all follow-up visits. This is important. Ask for help if you have counseling or nutritional needs during pregnancy. Your health care provider can offer advice or refer you to specialists for help with various needs. Schedule a dentist appointment. At home, brush your teeth with a soft toothbrush. Floss gently. Write down your questions. Take them to your prenatal visits. Where to find more information American Pregnancy Association: americanpregnancy.org American College of Obstetricians and Gynecologists: acog.org/en/Womens%20Health/Pregnancy Office on Women's Health: womenshealth.gov/pregnancy Contact a health care provider if you have: Dizziness. A fever. Mild pelvic cramps, pelvic pressure, or nagging pain in the abdominal area. Nausea, vomiting, or diarrhea that lasts for 24 hours or longer. A bad-smelling vaginal discharge. Pain when you urinate. Known exposure to a contagious illness, such as chickenpox, measles, Zika virus, HIV, or hepatitis. Get help right away if you have: Spotting or bleeding from your vagina. Severe abdominal cramping or pain. Shortness of breath or chest pain. Any kind of trauma, such as from a fall or a car crash. New or increased pain, swelling, or redness in an arm or leg. Summary The first trimester of pregnancy starts on the first day of your last menstrual period until the end of week  12 (months 1 through 3). Eating 4 or 5 small meals a day rather than 3 large meals may help to relieve nausea and vomiting. Do not use any products that contain nicotine or tobacco, such as cigarettes, e-cigarettes, and chewing tobacco. If you need help quitting, ask your health care provider. Keep all follow-up visits. This is important. This information is not intended to replace advice given to you by your health care provider. Make sure you discuss any questions you have with your health care provider. Document Revised: 09/18/2019 Document Reviewed: 07/25/2019 Elsevier Patient Education  2023 Elsevier Inc.  

## 2021-08-12 ENCOUNTER — Other Ambulatory Visit: Payer: Self-pay

## 2021-08-12 ENCOUNTER — Ambulatory Visit (HOSPITAL_COMMUNITY)
Admission: EM | Admit: 2021-08-12 | Discharge: 2021-08-12 | Disposition: A | Discharge status: Home / Self Care | Payer: Medicaid Other | Attending: Family Medicine | Admitting: Family Medicine

## 2021-08-12 ENCOUNTER — Encounter (HOSPITAL_COMMUNITY): Payer: Self-pay

## 2021-08-12 DIAGNOSIS — J069 Acute upper respiratory infection, unspecified: Secondary | ICD-10-CM | POA: Diagnosis not present

## 2021-08-12 DIAGNOSIS — A749 Chlamydial infection, unspecified: Secondary | ICD-10-CM

## 2021-08-12 LAB — CBC/D/PLT+RPR+RH+ABO+RUBIGG...
Antibody Screen: NEGATIVE
Basophils Absolute: 0 10*3/uL (ref 0.0–0.2)
Basos: 0 %
EOS (ABSOLUTE): 0.2 10*3/uL (ref 0.0–0.4)
Eos: 2 %
HCV Ab: NONREACTIVE
HIV Screen 4th Generation wRfx: NONREACTIVE
Hematocrit: 35.7 % (ref 34.0–46.6)
Hemoglobin: 11.6 g/dL (ref 11.1–15.9)
Hepatitis B Surface Ag: NEGATIVE
Immature Grans (Abs): 0 10*3/uL (ref 0.0–0.1)
Immature Granulocytes: 0 %
Lymphocytes Absolute: 1.8 10*3/uL (ref 0.7–3.1)
Lymphs: 21 %
MCH: 25.9 pg — ABNORMAL LOW (ref 26.6–33.0)
MCHC: 32.5 g/dL (ref 31.5–35.7)
MCV: 80 fL (ref 79–97)
Monocytes Absolute: 0.5 10*3/uL (ref 0.1–0.9)
Monocytes: 6 %
Neutrophils Absolute: 6 10*3/uL (ref 1.4–7.0)
Neutrophils: 71 %
Platelets: 250 10*3/uL (ref 150–450)
RBC: 4.48 x10E6/uL (ref 3.77–5.28)
RDW: 16.3 % — ABNORMAL HIGH (ref 11.7–15.4)
RPR Ser Ql: NONREACTIVE
Rh Factor: POSITIVE
Rubella Antibodies, IGG: 4.29 index (ref >0.99)
WBC: 8.5 10*3/uL (ref 3.4–10.8)

## 2021-08-12 LAB — COMPREHENSIVE METABOLIC PANEL
ALT: 6 IU/L (ref 0–32)
AST: 12 IU/L (ref 0–40)
Albumin/Globulin Ratio: 1.5 (ref 1.2–2.2)
Albumin: 3.9 g/dL (ref 3.9–5.0)
Alkaline Phosphatase: 50 IU/L (ref 44–121)
BUN/Creatinine Ratio: 14 (ref 9–23)
BUN: 7 mg/dL (ref 6–20)
Bilirubin Total: 0.2 mg/dL (ref 0.0–1.2)
CO2: 22 mmol/L (ref 20–29)
Calcium: 9.1 mg/dL (ref 8.7–10.2)
Chloride: 103 mmol/L (ref 96–106)
Creatinine, Ser: 0.49 mg/dL — ABNORMAL LOW (ref 0.57–1.00)
Globulin, Total: 2.6 g/dL (ref 1.5–4.5)
Glucose: 74 mg/dL (ref 70–99)
Potassium: 4.2 mmol/L (ref 3.5–5.2)
Sodium: 138 mmol/L (ref 134–144)
Total Protein: 6.5 g/dL (ref 6.0–8.5)
eGFR: 137 mL/min/{1.73_m2} (ref >59)

## 2021-08-12 LAB — CERVICOVAGINAL ANCILLARY ONLY
Chlamydia: POSITIVE — AB
Comment: NEGATIVE
Comment: NORMAL
Neisseria Gonorrhea: NEGATIVE

## 2021-08-12 LAB — CYTOLOGY - PAP: Diagnosis: NEGATIVE

## 2021-08-12 LAB — PROTEIN / CREATININE RATIO, URINE
Creatinine, Urine: 114.7 mg/dL
Protein, Ur: 8.8 mg/dL
Protein/Creat Ratio: 77 mg/g creat (ref 0–200)

## 2021-08-12 LAB — HCV INTERPRETATION

## 2021-08-12 LAB — HEMOGLOBIN A1C
Est. average glucose Bld gHb Est-mCnc: 103 mg/dL
Hgb A1c MFr Bld: 5.2 % (ref 4.8–5.6)

## 2021-08-12 MED ORDER — AZITHROMYCIN 250 MG PO TABS: 1000.0000 mg | ORAL_TABLET | Freq: Once | ORAL | 0 refills | Status: AC

## 2021-08-12 NOTE — ED Triage Notes (Signed)
Pt is [redacted] weeks pregnant who saw her OB yesterday and tested positive for chlamydia. She has not been treated with medication.  ?Pt thinks she has a sinus infection and c/o headache with pressure. ?

## 2021-08-12 NOTE — Discharge Instructions (Addendum)
Take azithromycin 250 mg--4 tablets all at once after a good meal.  Rest from intercourse for 1 to 2 weeks. ? ? ? ?You have been swabbed for COVID, and the test will result in the next 24 hours. Our staff will call you if positive. If the test is positive, you should quarantine for 5 days.  ? ?Use saline nose spray to rinse your nose 2-3 times daily. ?

## 2021-08-12 NOTE — ED Provider Notes (Signed)
?[place] ? ? ? ?CSN: [id number] ?Arrival date & time: 08/12/21  Bosie Helper ? ? ?  ? ?History   ?Chief Complaint ?Chief Complaint  ?Patient presents with  ? Sinusitis  ? Headache  ? ? ?HPI ?Carol Hendrix is a 21 y.o. female.  ? ? ?Sinusitis ?Associated symptoms: headaches   ?Headache ?Here for having been positive for chlamydia yesterday on testing with her OB/GYN.  I can see that in the epic chart.  She is pregnant currently. ? ?She also notes about a 1 week history of sinus pressure.  At first she has some sore throat and some headache and now she mainly notes the pressure in her cheek areas.  No fever or chills. ? ?Past Medical History:  ?Diagnosis Date  ? Anemia   ? Asthma   ? ? ?Patient Active Problem List  ? Diagnosis Date Noted  ? Twin pregnancy, twins dichorionic and diamniotic 08/11/2021  ? Syncope 08/11/2021  ? Unwanted fertility 08/11/2021  ? Supervision of other normal pregnancy, antepartum 07/21/2021  ? Moderate persistent asthma 03/25/2018  ? Tobacco use disorder 03/25/2018  ? ? ?Past Surgical History:  ?Procedure Laterality Date  ? NO PAST SURGERIES    ? ? ?OB History   ? ? Gravida  ?3  ? Para  ?2  ? Term  ?2  ? Preterm  ?0  ? AB  ?0  ? Living  ?2  ?  ? ? SAB  ?0  ? IAB  ?0  ? Ectopic  ?0  ? Multiple  ?0  ? Live Births  ?2  ?   ?  ?  ? ? ? ?Home Medications   ? ?Prior to Admission medications   ?Medication Sig Start Date End Date Taking? Authorizing Provider  ?azithromycin (ZITHROMAX) 250 MG tablet Take 4 tablets (1,000 mg total) by mouth once for 1 dose. Take first 2 tablets together, then 1 every day until finished. 08/12/21 08/12/21 Yes Barrett Henle, MD  ?acetaminophen (TYLENOL) 500 MG tablet Take 1,000 mg by mouth every 6 (six) hours as needed for moderate pain.    [provider]  ?albuterol (VENTOLIN HFA) 108 (90 Base) MCG/ACT inhaler Inhale 1-2 puffs into the lungs every 6 (six) hours as needed for wheezing or shortness of breath. 12/30/19   Henderly, Britni A, PA-C  ?aspirin  EC 81 MG tablet Take 1 tablet (81 mg total) by mouth daily. Take after 12 weeks for prevention of preeclampsia later in pregnancy 08/11/21   Chancy Milroy, MD  ?ondansetron (ZOFRAN-ODT) 4 MG disintegrating tablet Take 1 tablet (4 mg total) by mouth every 6 (six) hours as needed for nausea. 08/11/21   Chancy Milroy, MD  ?Prenatal Vit-Fe Fumarate-FA (PRENATAL VITAMIN PO) Take by mouth daily.    [provider]  ?promethazine (PHENERGAN) 12.5 MG tablet Take 1 tablet (12.5 mg total) by mouth every 6 (six) hours as needed for nausea or vomiting. 07/16/21   Darlina Rumpf, CNM  ?ipratropium (ATROVENT) 0.03 % nasal spray Place 2 sprays into both nostrils 2 (two) times daily. ?Patient not taking: Reported on 07/31/2018 06/21/18 11/13/18  Scot Jun, FNP  ? ? ?Family History ?Family History  ?Problem Relation Age of Onset  ? Healthy Mother   ? Healthy Father   ? Pulmonary fibrosis Maternal Grandmother   ? ? ?Social History ?Social History  ? ?Tobacco Use  ? Smoking status: Former  ?  Packs/day: 0.25  ?  Types: Cigarettes  ?  Smokeless tobacco: Never  ? Tobacco comments:  ?  rolls marijuana in with tobacco  ?Vaping Use  ? Vaping Use: Never used  ?Substance Use Topics  ? Alcohol use: Not Currently  ? Drug use: Not Currently  ?  Types: Marijuana  ?  Comment: last smoked marijuana one year ago as of 07/16/2021  ? ? ? ?Allergies   ?Patient has no known allergies. ? ? ?Review of Systems ?Review of Systems  ?Neurological:  Positive for headaches.  ? ? ?Physical Exam ?Triage Vital Signs ?ED Triage Vitals  ?Enc Vitals Group  ?   BP   ?   Pulse   ?   Resp   ?   Temp   ?   Temp src   ?   SpO2   ?   Weight   ?   Height   ?   Head Circumference   ?   Peak Flow   ?   Pain Score   ?   Pain Loc   ?   Pain Edu?   ?   Excl. in [place]?   ? ?No data found. ? ?Updated Vital Signs ?LMP 05/15/2021  ? ?Visual Acuity ?Right Eye Distance:   ?Left Eye Distance:   ?Bilateral Distance:   ? ?Right Eye Near:   ?Left Eye Near:    ?Bilateral  Near:    ? ?Physical Exam ?Vitals reviewed.  ?Constitutional:   ?   General: She is not in acute distress. ?   Appearance: She is not toxic-appearing.  ?HENT:  ?   Right Ear: Tympanic membrane and ear canal normal.  ?   Left Ear: Tympanic membrane and ear canal normal.  ?   Nose: Nose normal.  ?   Mouth/Throat:  ?   Mouth: Mucous membranes are moist.  ?   Pharynx: No oropharyngeal exudate or posterior oropharyngeal erythema.  ?Eyes:  ?   Extraocular Movements: Extraocular movements intact.  ?   Conjunctiva/sclera: Conjunctivae normal.  ?   Pupils: Pupils are equal, round, and reactive to light.  ?Cardiovascular:  ?   Rate and Rhythm: Normal rate and regular rhythm.  ?   Heart sounds: No murmur heard. ?Pulmonary:  ?   Effort: Pulmonary effort is normal. No respiratory distress.  ?   Breath sounds: No wheezing, rhonchi or rales.  ?Musculoskeletal:  ?   Cervical back: Neck supple.  ?Lymphadenopathy:  ?   Cervical: No cervical adenopathy.  ?Skin: ?   Capillary Refill: Capillary refill takes less than 2 seconds.  ?   Coloration: Skin is not jaundiced or pale.  ?Neurological:  ?   General: No focal deficit present.  ?   Mental Status: She is alert and oriented to person, place, and time.  ?Psychiatric:     ?   Behavior: Behavior normal.  ? ? ? ?UC Treatments / Results  ?Labs ?(all labs ordered are listed, but only abnormal results are displayed) ?Labs Reviewed - No data to display ? ?EKG ? ? ?Radiology ?No results found. ? ?Procedures ?Procedures (including critical care time) ? ?Medications Ordered in UC ?Medications - No data to display ? ?Initial Impression / Assessment and Plan / UC Course  ?I have reviewed the triage vital signs and the nursing notes. ? ?Pertinent labs & imaging results that were available during my care of the patient were reviewed by me and considered in my medical decision making (see chart for details). ? ?  ? ?We will treat the  chlamydia with azithromycin 1 g. ? ?Symptomatic treatment for the  URI symptoms, and we will test for COVID since she is pregnant, So that we can notify her OB/GYN if she is positive ?Final Clinical Impressions(s) / UC Diagnoses  ? ?Final diagnoses:  ?Chlamydia  ?Viral upper respiratory tract infection  ? ? ? ?Discharge Instructions   ? ?  ?Take azithromycin 250 mg--4 tablets all at once after a good meal.  Rest from intercourse for 1 to 2 weeks. ? ? ? ?You have been swabbed for COVID, and the test will result in the next 24 hours. Our staff will call you if positive. If the test is positive, you should quarantine for 5 days.  ? ?Use saline nose spray to rinse your nose 2-3 times daily. ? ? ? ? ?ED Prescriptions   ? ? Medication Sig Dispense Auth. Provider  ? azithromycin (ZITHROMAX) 250 MG tablet Take 4 tablets (1,000 mg total) by mouth once for 1 dose. Take first 2 tablets together, then 1 every day until finished. 4 tablet Windy Carina Gwenlyn Perking, MD  ? ?  ? ?PDMP not reviewed this encounter. ?  ?Barrett Henle, MD ?08/12/21 1938 ? ?

## 2021-08-13 ENCOUNTER — Encounter: Payer: Self-pay | Admitting: Obstetrics and Gynecology

## 2021-08-13 DIAGNOSIS — A749 Chlamydial infection, unspecified: Secondary | ICD-10-CM | POA: Insufficient documentation

## 2021-08-13 LAB — URINE CULTURE, OB REFLEX: Organism ID, Bacteria: NO GROWTH

## 2021-08-13 LAB — CULTURE, OB URINE

## 2021-08-16 ENCOUNTER — Other Ambulatory Visit: Payer: Self-pay

## 2021-08-16 ENCOUNTER — Emergency Department (HOSPITAL_COMMUNITY)
Admission: EM | Admit: 2021-08-16 | Discharge: 2021-08-16 | Disposition: A | Discharge status: Home / Self Care | Payer: Medicaid Other | Attending: Emergency Medicine | Admitting: Emergency Medicine

## 2021-08-16 ENCOUNTER — Encounter (HOSPITAL_COMMUNITY): Payer: Self-pay | Admitting: Emergency Medicine

## 2021-08-16 ENCOUNTER — Emergency Department (HOSPITAL_COMMUNITY): Payer: Medicaid Other

## 2021-08-16 DIAGNOSIS — Z3A13 13 weeks gestation of pregnancy: Secondary | ICD-10-CM | POA: Diagnosis not present

## 2021-08-16 DIAGNOSIS — S0990XA Unspecified injury of head, initial encounter: Secondary | ICD-10-CM | POA: Insufficient documentation

## 2021-08-16 DIAGNOSIS — R1030 Lower abdominal pain, unspecified: Secondary | ICD-10-CM | POA: Insufficient documentation

## 2021-08-16 DIAGNOSIS — Z7982 Long term (current) use of aspirin: Secondary | ICD-10-CM | POA: Insufficient documentation

## 2021-08-16 DIAGNOSIS — Y92410 Unspecified street and highway as the place of occurrence of the external cause: Secondary | ICD-10-CM | POA: Diagnosis not present

## 2021-08-16 DIAGNOSIS — Z7951 Long term (current) use of inhaled steroids: Secondary | ICD-10-CM | POA: Diagnosis not present

## 2021-08-16 DIAGNOSIS — O30041 Twin pregnancy, dichorionic/diamniotic, first trimester: Secondary | ICD-10-CM | POA: Diagnosis not present

## 2021-08-16 DIAGNOSIS — O3680X0 Pregnancy with inconclusive fetal viability, not applicable or unspecified: Secondary | ICD-10-CM | POA: Diagnosis not present

## 2021-08-16 DIAGNOSIS — O26891 Other specified pregnancy related conditions, first trimester: Secondary | ICD-10-CM | POA: Diagnosis not present

## 2021-08-16 DIAGNOSIS — O9A211 Injury, poisoning and certain other consequences of external causes complicating pregnancy, first trimester: Secondary | ICD-10-CM | POA: Insufficient documentation

## 2021-08-16 DIAGNOSIS — J4540 Moderate persistent asthma, uncomplicated: Secondary | ICD-10-CM | POA: Insufficient documentation

## 2021-08-16 MED ORDER — ACETAMINOPHEN 325 MG PO TABS
650.0000 mg | ORAL_TABLET | Freq: Once | ORAL | Status: AC
  Administered 2021-08-16: 650 mg via ORAL
  Filled 2021-08-16: qty 2
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
## 2021-08-16 NOTE — ED Notes (Signed)
Spoke with [facility] who spoke to on call obgyn and recommended [place] given gestational age. MD notified ?

## 2021-08-16 NOTE — ED Notes (Signed)
Patient verbalizes understanding of discharge instructions. Opportunity for questioning and answers were provided. Armband removed by staff, pt discharged from ED.  
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
## 2021-08-18 ENCOUNTER — Encounter: Payer: Self-pay | Admitting: Obstetrics and Gynecology

## 2021-08-19 ENCOUNTER — Ambulatory Visit (HOSPITAL_COMMUNITY): Payer: Medicaid Other | Attending: Cardiology

## 2021-08-19 DIAGNOSIS — R55 Syncope and collapse: Secondary | ICD-10-CM | POA: Insufficient documentation

## 2021-08-19 LAB — ECHOCARDIOGRAM COMPLETE
Area-P 1/2: 4.32 cm2
S' Lateral: 2.9 cm

## 2021-08-23 ENCOUNTER — Encounter: Payer: Self-pay | Admitting: Obstetrics and Gynecology

## 2021-08-23 DIAGNOSIS — Z419 Encounter for procedure for purposes other than remedying health state, unspecified: Secondary | ICD-10-CM | POA: Diagnosis not present

## 2021-09-08 ENCOUNTER — Encounter: Payer: Self-pay | Admitting: Obstetrics

## 2021-09-08 ENCOUNTER — Ambulatory Visit (INDEPENDENT_AMBULATORY_CARE_PROVIDER_SITE_OTHER): Payer: Medicaid Other | Admitting: Obstetrics

## 2021-09-08 VITALS — BP 105/70 | Wt 134.6 lb

## 2021-09-08 DIAGNOSIS — O0990 Supervision of high risk pregnancy, unspecified, unspecified trimester: Secondary | ICD-10-CM | POA: Diagnosis not present

## 2021-09-08 DIAGNOSIS — O30042 Twin pregnancy, dichorionic/diamniotic, second trimester: Secondary | ICD-10-CM

## 2021-09-08 NOTE — Progress Notes (Signed)
Patient presents for ROB and AFP. Patient has no concerns today ?

## 2021-09-08 NOTE — Addendum Note (Signed)
Addended by: Lucianne Lei on: 09/08/2021 02:30 PM ? ? Modules accepted: Orders ? ?

## 2021-09-08 NOTE — Progress Notes (Addendum)
Subjective:  ?Carol Hendrix is a 21 y.o. G3P2002 at [redacted]w[redacted]d being seen today for ongoing prenatal care.  She is currently monitored for the following issues for this high-risk pregnancy and has Moderate persistent asthma; Tobacco use disorder; Supervision of other normal pregnancy, antepartum; Twin pregnancy, twins dichorionic and diamniotic; Syncope; Unwanted fertility; and Chlamydia on their problem list. ? ?Patient reports heartburn.  Contractions: Irritability. Vag. Bleeding: None.  Movement: Present. Denies leaking of fluid.  ? ?The following portions of the patient's history were reviewed and updated as appropriate: allergies, current medications, past family history, past medical history, past social history, past surgical history and problem list. Problem list updated. ? ?Objective:  ? ?Vitals:  ? 09/08/21 1338  ?BP: 105/70  ?Weight: 134 lb 9.6 oz (61.1 kg)  ? ? ?Fetal Status: Fetal Heart Rate (bpm): A: 155, B: 147   Movement: Present    ? ?General:  Alert, oriented and cooperative. Patient is in no acute distress.  ?Skin: Skin is warm and dry. No rash noted.   ?Cardiovascular: Normal heart rate noted  ?Respiratory: Normal respiratory effort, no problems with respiration noted  ?Abdomen: Soft, gravid, appropriate for gestational age. Pain/Pressure: Absent     ?Pelvic:  Cervical exam deferred        ?Extremities: Normal range of motion.  Edema: None  ?Mental Status: Normal mood and affect. Normal behavior. Normal judgment and thought content.  ? ?Urinalysis:     ? ?Assessment and Plan:  ?Pregnancy: [id number] at [redacted]w[redacted]d ? ?1. Supervision of high risk pregnancy, antepartum ?Rx: ?- AFP, Serum, Open Spina Bifida ? ?2. Dichorionic diamniotic twin pregnancy in second trimester ?Rx: ?- [place] MFM OB DETAIL +14 WK; Future ?- [place] MFM OB DETAIL ADDL GEST +14 WK; Future ? ? ? ?Preterm labor symptoms and general obstetric precautions including but not limited to vaginal bleeding, contractions, leaking of fluid and fetal movement  were reviewed in detail with the patient. ?Please refer to After Visit Summary for other counseling recommendations.  ? ?Return in about 4 weeks (around 10/06/2021) for [facility]. ? ? ?Shelly Bombard, MD  ?09/08/21  ?

## 2021-09-08 NOTE — Addendum Note (Signed)
Addended by: Brock Bad on: 09/08/2021 02:36 PM ? ? Modules accepted: Orders ? ?

## 2021-09-11 LAB — AFP, SERUM, OPEN SPINA BIFIDA
AFP MoM: 2.81
AFP Value: 115.6 ng/mL
Gest. Age on Collection Date: 16 weeks
Maternal Age At EDD: 22.5 yr
OSBR Risk 1 IN: 936
Test Results:: NEGATIVE
Weight: 133 [lb_av]

## 2021-09-23 DIAGNOSIS — Z419 Encounter for procedure for purposes other than remedying health state, unspecified: Secondary | ICD-10-CM | POA: Diagnosis not present

## 2021-10-06 ENCOUNTER — Other Ambulatory Visit (HOSPITAL_COMMUNITY)
Admission: RE | Admit: 2021-10-06 | Discharge: 2021-10-06 | Disposition: A | Discharge status: Home / Self Care | Payer: Medicaid Other | Source: Ambulatory Visit | Attending: Obstetrics and Gynecology | Admitting: Obstetrics and Gynecology

## 2021-10-06 ENCOUNTER — Encounter: Payer: Self-pay | Admitting: Obstetrics and Gynecology

## 2021-10-06 ENCOUNTER — Ambulatory Visit (INDEPENDENT_AMBULATORY_CARE_PROVIDER_SITE_OTHER): Payer: Medicaid Other | Admitting: Obstetrics and Gynecology

## 2021-10-06 VITALS — BP 106/68 | HR 84 | Wt 142.0 lb

## 2021-10-06 DIAGNOSIS — A749 Chlamydial infection, unspecified: Secondary | ICD-10-CM | POA: Insufficient documentation

## 2021-10-06 DIAGNOSIS — Z3480 Encounter for supervision of other normal pregnancy, unspecified trimester: Secondary | ICD-10-CM

## 2021-10-06 DIAGNOSIS — O30042 Twin pregnancy, dichorionic/diamniotic, second trimester: Secondary | ICD-10-CM

## 2021-10-06 DIAGNOSIS — Z3009 Encounter for other general counseling and advice on contraception: Secondary | ICD-10-CM

## 2021-10-06 NOTE — Progress Notes (Signed)
Subjective:  Carol Hendrix is a 21 y.o. G3P2002 at [redacted]w[redacted]d being seen today for ongoing prenatal care.  She is currently monitored for the following issues for this high-risk pregnancy and has Moderate persistent asthma; Tobacco use disorder; Supervision of other normal pregnancy, antepartum; Twin pregnancy, twins dichorionic and diamniotic; Syncope; Unwanted fertility; and Chlamydia on their problem list.  Patient reports no complaints.  Contractions: Not present. Vag. Bleeding: None.  Movement: Present. Denies leaking of fluid.   The following portions of the patient's history were reviewed and updated as appropriate: allergies, current medications, past family history, past medical history, past social history, past surgical history and problem list. Problem list updated.  Objective:   Vitals:   10/06/21 1337  BP: 106/68  Pulse: 84  Weight: 142 lb (64.4 kg)    Fetal Status: Fetal Heart Rate (bpm): A:140, B:145   Movement: Present     General:  Alert, oriented and cooperative. Patient is in no acute distress.  Skin: Skin is warm and dry. No rash noted.   Cardiovascular: Normal heart rate noted  Respiratory: Normal respiratory effort, no problems with respiration noted  Abdomen: Soft, gravid, appropriate for gestational age. Pain/Pressure: Absent     Pelvic:  Cervical exam deferred        Extremities: Normal range of motion.     Mental Status: Normal mood and affect. Normal behavior. Normal judgment and thought content.   Urinalysis:      Assessment and Plan:  Pregnancy: G3P2002 at [redacted]w[redacted]d  1. Supervision of other normal pregnancy, antepartum Stable  2. Dichorionic diamniotic twin pregnancy in second trimester Stable Anatomy scan next week  3. Unwanted fertility BTL papers at 28 week visit  4. Chlamydia TOC today  Preterm labor symptoms and general obstetric precautions including but not limited to vaginal bleeding, contractions, leaking of fluid and fetal movement were  reviewed in detail with the patient. Please refer to After Visit Summary for other counseling recommendations.  Return in about 4 weeks (around 11/03/2021) for OB visit, face to face, MD only.   Hermina Staggers, MD

## 2021-10-06 NOTE — Patient Instructions (Signed)

## 2021-10-06 NOTE — Progress Notes (Signed)
Pt states she is having some vaginal itching, ?yeast - will self swab today.

## 2021-10-07 LAB — CERVICOVAGINAL ANCILLARY ONLY
Bacterial Vaginitis (gardnerella): POSITIVE — AB
Candida Glabrata: NEGATIVE
Candida Vaginitis: POSITIVE — AB
Comment: NEGATIVE
Comment: NEGATIVE
Comment: NEGATIVE

## 2021-10-11 ENCOUNTER — Other Ambulatory Visit: Payer: Self-pay | Admitting: *Deleted

## 2021-10-11 DIAGNOSIS — B9689 Other specified bacterial agents as the cause of diseases classified elsewhere: Secondary | ICD-10-CM

## 2021-10-11 DIAGNOSIS — B3731 Acute candidiasis of vulva and vagina: Secondary | ICD-10-CM

## 2021-10-11 MED ORDER — METRONIDAZOLE 500 MG PO TABS: 500.0000 mg | ORAL_TABLET | Freq: Two times a day (BID) | ORAL | 0 refills | Status: DC

## 2021-10-11 MED ORDER — TERCONAZOLE 0.8 % VA CREA: 1.0000 | TOPICAL_CREAM | Freq: Every day | VAGINAL | 0 refills | Status: DC

## 2021-10-11 NOTE — Progress Notes (Signed)
RX for BV and yeast per protocol. Pt notified per Dr. Alysia Penna.

## 2021-10-12 ENCOUNTER — Ambulatory Visit: Payer: Medicaid Other | Attending: Obstetrics | Admitting: Obstetrics and Gynecology

## 2021-10-12 ENCOUNTER — Ambulatory Visit: Payer: Medicaid Other | Admitting: *Deleted

## 2021-10-12 ENCOUNTER — Ambulatory Visit: Payer: Medicaid Other | Attending: Obstetrics

## 2021-10-12 VITALS — BP 110/59 | HR 80

## 2021-10-12 DIAGNOSIS — O30042 Twin pregnancy, dichorionic/diamniotic, second trimester: Secondary | ICD-10-CM

## 2021-10-12 DIAGNOSIS — O0990 Supervision of high risk pregnancy, unspecified, unspecified trimester: Secondary | ICD-10-CM | POA: Insufficient documentation

## 2021-10-12 DIAGNOSIS — Z3A21 21 weeks gestation of pregnancy: Secondary | ICD-10-CM

## 2021-10-13 ENCOUNTER — Other Ambulatory Visit: Payer: Self-pay | Admitting: *Deleted

## 2021-10-13 DIAGNOSIS — O30042 Twin pregnancy, dichorionic/diamniotic, second trimester: Secondary | ICD-10-CM

## 2021-10-13 NOTE — Progress Notes (Signed)
Maternal-Fetal Medicine   Name: Carol Hendrix DOB: 0/11/2000 MRN: [id number] Referring Provider: Nettie Elm, MD  I had the pleasure of seeing [name] [facility]. She is G3 P2002 at 21w 3d gestation with twin pregnancy and is here for fetal anatomy scan.  Her pregnancy is well dated by LMP date consistent with 12-week ultrasound.  Dichorionic-diamniotic twin pregnancy was confirmed at 12-week ultrasound.  On cell-free fetal DNA screening, the risks of fetal aneuploidies are not increased.  MSAFP screening showed low risk for open neural tube defects. Patient has anemia and takes iron supplements.  Patient takes low-dose aspirin prophylaxis. Obstetric history significant for 2 term vaginal deliveries. Ultrasound We confirmed with dichorionic-diamniotic twin pregnancy. Twin A: Maternal right, anterior placenta, female fetus, cephalic presentation.  Fetal biometry is consistent with the previously established dates.  No markers of aneuploidies or fetal structural defects are seen.  Amniotic fluid is normal and good fetal activity seen. Twin B: Maternal left, anterior placenta, cephalic presentation, female fetus. Fetal biometry is consistent with the previously established dates.  No markers of aneuploidies or fetal structural defects are seen.  Amniotic fluid is normal and good fetal activity seen. Growth discordancy: 3% (normal).  Twin pregnancy  I explained the significance of chorionicity and its implications. I explained the possible complications associated with twin pregnancy that include preterm labor/delivery (most common), fetal growth restriction of one or both twins, malpresentations and increased cesarean delivery rate, postpartum hemorrhage. I also informed her that maternal complications including gestational diabetes and gestational hypertension/preeclampsia are more common.  I encouraged her to take iron supplements to correct anemia so that blood  transfusion can be avoided.  If she is unable to tolerate oral iron, intravenous iron transfusions may be considered.  I discussed the mode of delivery that is based on the presentations.  If both have Vx/Vx or Vx/non-vertex presentations, vaginal delivery may be considered. In Vx/non-vx, vaginal delivery followed by internal podalic version of second twin will achieve vaginal delivery. In non-vx first twin presentation, cesarean delivery will be performed.  I emphasized the importance of weight gain (24 lbs by 24 weeks) to improve fetal weight and decrease the chances of preterm delivery. Prophylactic low-dose aspirin delays or prevents preeclampsia. Twin pregnancy is at high risk for preeclampsia. I recommended that she continue aspirin till delivery.   Recommendations -An appointment was made for her to return in 4 weeks for fetal growth assessment. -Fetal growth assessments every 4 weeks. -BPP at 42- and 37-weeks gestation. -Delivery at [redacted] weeks gestation.  Thank you for consultation.  If you have any questions or concerns, please contact me the [facility].  Consultation including face-to-face (more than 50%) counseling 30 minutes.

## 2021-10-23 DIAGNOSIS — Z419 Encounter for procedure for purposes other than remedying health state, unspecified: Secondary | ICD-10-CM | POA: Diagnosis not present

## 2021-10-26 ENCOUNTER — Encounter (HOSPITAL_COMMUNITY): Payer: Self-pay

## 2021-10-26 ENCOUNTER — Telehealth (HOSPITAL_COMMUNITY): Payer: Self-pay | Admitting: Emergency Medicine

## 2021-10-26 ENCOUNTER — Ambulatory Visit (HOSPITAL_COMMUNITY)
Admission: RE | Admit: 2021-10-26 | Discharge: 2021-10-26 | Disposition: A | Discharge status: Home / Self Care | Payer: Medicaid Other | Source: Ambulatory Visit | Attending: Emergency Medicine | Admitting: Emergency Medicine

## 2021-10-26 VITALS — BP 104/66 | HR 73 | Temp 98.7°F | Resp 18

## 2021-10-26 DIAGNOSIS — H00014 Hordeolum externum left upper eyelid: Secondary | ICD-10-CM

## 2021-10-26 MED ORDER — POLYMYXIN B-TRIMETHOPRIM 10000-0.1 UNIT/ML-% OP SOLN: 1.0000 [drp] | OPHTHALMIC | 0 refills | Status: DC

## 2021-10-26 NOTE — ED Triage Notes (Signed)
Pt reports a knot on left upper eyelid since April. Denies pain. Reports thought was a sty at first but has never gone away.

## 2021-10-26 NOTE — ED Provider Notes (Signed)
[facility]    CSN: [id number] Arrival date & time: 10/26/21  1414      History   Chief Complaint Chief Complaint  Patient presents with   Facial Swelling    HPI MYEESHA SHANE is a 21 y.o. female.   Patient presents with a bump to the left upper eyelid beginning in April 2023.  Bump fluctuates in size.  Does not cause pain, is not pruritic and is nondraining.  Has attempted use of over-the-counter stye drops which has been ineffective.  Denies fever, chills, blurred vision, light sensitivity.  Does not wear contacts nor use glasses.   Past Medical History:  Diagnosis Date   Anemia    Asthma     Patient Active Problem List   Diagnosis Date Noted   Chlamydia 08/13/2021   Twin pregnancy, twins dichorionic and diamniotic 08/11/2021   Syncope 08/11/2021   Unwanted fertility 08/11/2021   Supervision of other normal pregnancy, antepartum 07/21/2021   Moderate persistent asthma 03/25/2018   Tobacco use disorder 03/25/2018    Past Surgical History:  Procedure Laterality Date   NO PAST SURGERIES      OB History     Gravida  3   Para  2   Term  2   Preterm  0   AB  0   Living  2      SAB  0   IAB  0   Ectopic  0   Multiple  0   Live Births  2            Home Medications    Prior to Admission medications   Medication Sig Start Date End Date Taking? Authorizing Provider  acetaminophen (TYLENOL) 500 MG tablet Take 1,000 mg by mouth every 6 (six) hours as needed for moderate pain.    [provider]  albuterol (VENTOLIN HFA) 108 (90 Base) MCG/ACT inhaler Inhale 1-2 puffs into the lungs every 6 (six) hours as needed for wheezing or shortness of breath. 12/30/19   Henderly, Britni A, PA-C  aspirin EC 81 MG tablet Take 1 tablet (81 mg total) by mouth daily. Take after 12 weeks for prevention of preeclampsia later in pregnancy 08/11/21   Hermina Staggers, MD  metroNIDAZOLE (FLAGYL) 500 MG tablet Take 1 tablet (500 mg total) by mouth 2  (two) times daily. 10/11/21   Hermina Staggers, MD  Prenatal Vit-Fe Fumarate-FA (PRENATAL VITAMIN PO) Take by mouth daily.    [provider]  terconazole (TERAZOL 3) 0.8 % vaginal cream Place 1 applicator vaginally at bedtime. Apply nightly for three nights. 10/11/21   Hermina Staggers, MD  ipratropium (ATROVENT) 0.03 % nasal spray Place 2 sprays into both nostrils 2 (two) times daily. Patient not taking: Reported on 07/31/2018 06/21/18 11/13/18  Bing Neighbors, FNP    Family History Family History  Problem Relation Age of Onset   Healthy Mother    Healthy Father    Pulmonary fibrosis Maternal Grandmother     Social History Social History   Tobacco Use   Smoking status: Former    Packs/day: 0.25    Types: Cigarettes   Smokeless tobacco: Never   Tobacco comments:    rolls marijuana in with tobacco  Vaping Use   Vaping Use: Never used  Substance Use Topics   Alcohol use: Not Currently   Drug use: Not Currently    Types: Marijuana    Comment: last smoked marijuana one year ago as of 07/16/2021  Allergies   Patient has no known allergies.   Review of Systems Review of Systems Defer to HPI   Physical Exam Triage Vital Signs ED Triage Vitals  Enc Vitals Group     BP 10/26/21 1443 104/66     Pulse Rate 10/26/21 1443 73     Resp 10/26/21 1443 18     Temp 10/26/21 1443 98.7 F (37.1 C)     Temp Source 10/26/21 1443 Oral     SpO2 10/26/21 1443 98 %     Weight --      Height --      Head Circumference --      Peak Flow --      Pain Score 10/26/21 1442 0     Pain Loc --      Pain Edu? --      Excl. in GC? --    No data found.  Updated Vital Signs BP 104/66 (BP Location: Left Arm)   Pulse 73   Temp 98.7 F (37.1 C) (Oral)   Resp 18   LMP 05/15/2021   SpO2 98%   Visual Acuity Right Eye Distance:   Left Eye Distance:   Bilateral Distance:    Right Eye Near:   Left Eye Near:    Bilateral Near:     Physical Exam Constitutional:       Appearance: Normal appearance.  HENT:     Head: Normocephalic.  Eyes:     Extraocular Movements: Extraocular movements intact.     Comments: Less than 0.5 cm borderline present to the medial aspect of the right upper eyelid, no abnormalities to the sclera, iris or conjunctiva, vision grossly intact, extraocular movements intact  Pulmonary:     Effort: Pulmonary effort is normal.  Neurological:     Mental Status: She is alert and oriented to person, place, and time. Mental status is at baseline.  Psychiatric:        Mood and Affect: Mood normal.        Behavior: Behavior normal.      UC Treatments / Results  Labs (all labs ordered are listed, but only abnormal results are displayed) Labs Reviewed - No data to display  EKG   Radiology No results found.  Procedures Procedures (including critical care time)  Medications Ordered in UC Medications - No data to display  Initial Impression / Assessment and Plan / UC Course  I have reviewed the triage vital signs and the nursing notes.  Pertinent labs & imaging results that were available during my care of the patient were reviewed by me and considered in my medical decision making (see chart for details).  Hordeolum externum of the left upper eyelid  As hordeolum has been present for 3 months we will provide bacterial coverage, Polytrim prescribed, discussed administration, advised against eye touching or rubbing to prevent further irritation and spread, recommended warm compresses over the affected area, may follow-up with urgent care as needed if symptoms persist, given walking referral to ophthalmology in addition Final Clinical Impressions(s) / UC Diagnoses   Final diagnoses:  None   Discharge Instructions   None    ED Prescriptions   None    PDMP not reviewed this encounter.   Valinda Hoar, NP 10/26/21 1512

## 2021-10-26 NOTE — Discharge Instructions (Signed)
Today you are being treated for a stye, typically these resolve in 3 weeks but as source have persisted for months we will provide bacterial coverage  Place one drop of polytrim into the effected eye every 4 hours while awake for 7 days  Number compresses over the affected continue 15-minute intervals  Do not rub eyes, this may cause more irritation.  Please avoid use of eye makeup until symptoms clear.  If symptoms persist after use of medication, please follow up at Urgent Care or with ophthalmologist (eye doctor)

## 2021-10-29 ENCOUNTER — Ambulatory Visit: Payer: Medicaid Other | Admitting: Cardiology

## 2021-10-29 ENCOUNTER — Telehealth: Payer: Self-pay

## 2021-10-29 NOTE — Telephone Encounter (Signed)
Late entry:  Called pt at 11:35am today. She agreeable to come in at 3:20 today instead of 4:00pm.

## 2021-11-01 DIAGNOSIS — D509 Iron deficiency anemia, unspecified: Secondary | ICD-10-CM | POA: Diagnosis not present

## 2021-11-01 DIAGNOSIS — J209 Acute bronchitis, unspecified: Secondary | ICD-10-CM | POA: Diagnosis not present

## 2021-11-01 DIAGNOSIS — D649 Anemia, unspecified: Secondary | ICD-10-CM | POA: Diagnosis not present

## 2021-11-01 DIAGNOSIS — R0602 Shortness of breath: Secondary | ICD-10-CM | POA: Diagnosis not present

## 2021-11-01 DIAGNOSIS — J45909 Unspecified asthma, uncomplicated: Secondary | ICD-10-CM | POA: Diagnosis not present

## 2021-11-01 DIAGNOSIS — Z743 Need for continuous supervision: Secondary | ICD-10-CM | POA: Diagnosis not present

## 2021-11-01 DIAGNOSIS — R062 Wheezing: Secondary | ICD-10-CM | POA: Diagnosis not present

## 2021-11-01 DIAGNOSIS — J45901 Unspecified asthma with (acute) exacerbation: Secondary | ICD-10-CM | POA: Diagnosis not present

## 2021-11-03 ENCOUNTER — Ambulatory Visit (INDEPENDENT_AMBULATORY_CARE_PROVIDER_SITE_OTHER): Payer: Medicaid Other | Admitting: Obstetrics and Gynecology

## 2021-11-03 VITALS — BP 100/65 | HR 76 | Wt 153.0 lb

## 2021-11-03 DIAGNOSIS — O30042 Twin pregnancy, dichorionic/diamniotic, second trimester: Secondary | ICD-10-CM

## 2021-11-03 DIAGNOSIS — Z3A24 24 weeks gestation of pregnancy: Secondary | ICD-10-CM

## 2021-11-03 DIAGNOSIS — Z3480 Encounter for supervision of other normal pregnancy, unspecified trimester: Secondary | ICD-10-CM

## 2021-11-03 NOTE — Progress Notes (Signed)
   PRENATAL VISIT NOTE  Subjective:  Carol Hendrix is a 21 y.o. G3P2002 at [redacted]w[redacted]d being seen today for ongoing prenatal care.  She is currently monitored for the following issues for this high-risk pregnancy and has Moderate persistent asthma; Tobacco use disorder; Supervision of other normal pregnancy, antepartum; Twin pregnancy, twins dichorionic and diamniotic; Syncope; Unwanted fertility; and Chlamydia on their problem list.  Patient doing well with no acute concerns today. She reports no complaints.  Contractions: Not present. Vag. Bleeding: None.  Movement: Present. Denies leaking of fluid.   The following portions of the patient's history were reviewed and updated as appropriate: allergies, current medications, past family history, past medical history, past social history, past surgical history and problem list. Problem list updated.  Objective:   Vitals:   11/03/21 1059  BP: 100/65  Pulse: 76  Weight: 153 lb (69.4 kg)    Fetal Status: Fetal Heart Rate (bpm): A: 150 B: 145 Fundal Height: 26 cm Movement: Present     General:  Alert, oriented and cooperative. Patient is in no acute distress.  Skin: Skin is warm and dry. No rash noted.   Cardiovascular: Normal heart rate noted  Respiratory: Normal respiratory effort, no problems with respiration noted  Abdomen: Soft, gravid, appropriate for gestational age.  Pain/Pressure: Absent     Pelvic: Cervical exam deferred        Extremities: Normal range of motion.     Mental Status:  Normal mood and affect. Normal behavior. Normal judgment and thought content.   Assessment and Plan:  Pregnancy: G3P2002 at [redacted]w[redacted]d  1. [redacted] weeks gestation of pregnancy   2. Dichorionic diamniotic twin pregnancy in second trimester Pt has repeat scan on 7/17 23  3. Supervision of other normal pregnancy, antepartum Continue routine care, no acute issues today  Preterm labor symptoms and general obstetric precautions including but not limited to vaginal  bleeding, contractions, leaking of fluid and fetal movement were reviewed in detail with the patient.  Please refer to After Visit Summary for other counseling recommendations.   Return in about 3 weeks (around 11/24/2021) for ROB, in person, 2 hr GTT, 3rd trim labs.   Mariel Aloe, MD Faculty Attending [facility]

## 2021-11-08 ENCOUNTER — Encounter: Payer: Self-pay | Admitting: *Deleted

## 2021-11-08 ENCOUNTER — Ambulatory Visit: Payer: Medicaid Other | Admitting: *Deleted

## 2021-11-08 ENCOUNTER — Ambulatory Visit: Payer: Medicaid Other | Attending: Obstetrics and Gynecology

## 2021-11-08 VITALS — BP 118/60 | HR 78

## 2021-11-08 DIAGNOSIS — O358XX1 Maternal care for other (suspected) fetal abnormality and damage, fetus 1: Secondary | ICD-10-CM | POA: Diagnosis not present

## 2021-11-08 DIAGNOSIS — O30042 Twin pregnancy, dichorionic/diamniotic, second trimester: Secondary | ICD-10-CM

## 2021-11-08 DIAGNOSIS — Z3A25 25 weeks gestation of pregnancy: Secondary | ICD-10-CM

## 2021-11-08 DIAGNOSIS — O3500X2 Maternal care for (suspected) central nervous system malformation or damage in fetus, unspecified, fetus 2: Secondary | ICD-10-CM

## 2021-11-11 ENCOUNTER — Other Ambulatory Visit: Payer: Self-pay | Admitting: *Deleted

## 2021-11-11 DIAGNOSIS — Z362 Encounter for other antenatal screening follow-up: Secondary | ICD-10-CM

## 2021-11-11 DIAGNOSIS — O30042 Twin pregnancy, dichorionic/diamniotic, second trimester: Secondary | ICD-10-CM

## 2021-11-22 ENCOUNTER — Inpatient Hospital Stay (HOSPITAL_COMMUNITY)
Admission: AD | Admit: 2021-11-22 | Discharge: 2021-12-05 | DRG: 833 | Discharge status: Against Medical Advice | Payer: Medicaid Other | Attending: Obstetrics & Gynecology | Admitting: Obstetrics & Gynecology

## 2021-11-22 ENCOUNTER — Inpatient Hospital Stay (HOSPITAL_BASED_OUTPATIENT_CLINIC_OR_DEPARTMENT_OTHER): Payer: Medicaid Other

## 2021-11-22 ENCOUNTER — Encounter (HOSPITAL_COMMUNITY): Payer: Self-pay | Admitting: Obstetrics and Gynecology

## 2021-11-22 DIAGNOSIS — Z3A27 27 weeks gestation of pregnancy: Secondary | ICD-10-CM | POA: Diagnosis not present

## 2021-11-22 DIAGNOSIS — O42912 Preterm premature rupture of membranes, unspecified as to length of time between rupture and onset of labor, second trimester: Secondary | ICD-10-CM | POA: Diagnosis not present

## 2021-11-22 DIAGNOSIS — O321XX2 Maternal care for breech presentation, fetus 2: Secondary | ICD-10-CM | POA: Diagnosis present

## 2021-11-22 DIAGNOSIS — O4292 Full-term premature rupture of membranes, unspecified as to length of time between rupture and onset of labor: Secondary | ICD-10-CM

## 2021-11-22 DIAGNOSIS — O99512 Diseases of the respiratory system complicating pregnancy, second trimester: Secondary | ICD-10-CM | POA: Diagnosis present

## 2021-11-22 DIAGNOSIS — O30049 Twin pregnancy, dichorionic/diamniotic, unspecified trimester: Secondary | ICD-10-CM | POA: Diagnosis present

## 2021-11-22 DIAGNOSIS — O0990 Supervision of high risk pregnancy, unspecified, unspecified trimester: Secondary | ICD-10-CM

## 2021-11-22 DIAGNOSIS — Z87891 Personal history of nicotine dependence: Secondary | ICD-10-CM

## 2021-11-22 DIAGNOSIS — O30042 Twin pregnancy, dichorionic/diamniotic, second trimester: Secondary | ICD-10-CM | POA: Diagnosis not present

## 2021-11-22 DIAGNOSIS — O42913 Preterm premature rupture of membranes, unspecified as to length of time between rupture and onset of labor, third trimester: Secondary | ICD-10-CM | POA: Diagnosis not present

## 2021-11-22 DIAGNOSIS — O42919 Preterm premature rupture of membranes, unspecified as to length of time between rupture and onset of labor, unspecified trimester: Principal | ICD-10-CM | POA: Diagnosis present

## 2021-11-22 DIAGNOSIS — Z3A28 28 weeks gestation of pregnancy: Secondary | ICD-10-CM

## 2021-11-22 DIAGNOSIS — O42112 Preterm premature rupture of membranes, onset of labor more than 24 hours following rupture, second trimester: Secondary | ICD-10-CM | POA: Diagnosis present

## 2021-11-22 DIAGNOSIS — J4540 Moderate persistent asthma, uncomplicated: Secondary | ICD-10-CM | POA: Diagnosis present

## 2021-11-22 DIAGNOSIS — O30043 Twin pregnancy, dichorionic/diamniotic, third trimester: Secondary | ICD-10-CM | POA: Diagnosis not present

## 2021-11-22 DIAGNOSIS — Z5329 Procedure and treatment not carried out because of patient's decision for other reasons: Secondary | ICD-10-CM | POA: Diagnosis present

## 2021-11-22 DIAGNOSIS — Z3009 Encounter for other general counseling and advice on contraception: Secondary | ICD-10-CM | POA: Diagnosis present

## 2021-11-22 DIAGNOSIS — Z419 Encounter for procedure for purposes other than remedying health state, unspecified: Secondary | ICD-10-CM | POA: Diagnosis not present

## 2021-11-22 DIAGNOSIS — Z3A29 29 weeks gestation of pregnancy: Secondary | ICD-10-CM | POA: Diagnosis not present

## 2021-11-22 HISTORY — DX: Preterm premature rupture of membranes, onset of labor more than 24 hours following rupture, second trimester: O42.112

## 2021-11-22 LAB — URINALYSIS, ROUTINE W REFLEX MICROSCOPIC
Bilirubin Urine: NEGATIVE
Glucose, UA: NEGATIVE mg/dL
Hgb urine dipstick: NEGATIVE
Ketones, ur: NEGATIVE mg/dL
Leukocytes,Ua: NEGATIVE
Nitrite: NEGATIVE
Protein, ur: NEGATIVE mg/dL
Specific Gravity, Urine: 1.012 (ref 1.005–1.030)
pH: 7 (ref 5.0–8.0)

## 2021-11-22 LAB — HEMOGLOBIN A1C
Hgb A1c MFr Bld: 5 % (ref 4.8–5.6)
Mean Plasma Glucose: 96.8 mg/dL

## 2021-11-22 LAB — CBC
HCT: 27.9 % — ABNORMAL LOW (ref 36.0–46.0)
Hemoglobin: 8.7 g/dL — ABNORMAL LOW (ref 12.0–15.0)
MCH: 24.2 pg — ABNORMAL LOW (ref 26.0–34.0)
MCHC: 31.2 g/dL (ref 30.0–36.0)
MCV: 77.5 fL — ABNORMAL LOW (ref 80.0–100.0)
Platelets: 234 10*3/uL (ref 150–400)
RBC: 3.6 MIL/uL — ABNORMAL LOW (ref 3.87–5.11)
RDW: 14.3 % (ref 11.5–15.5)
WBC: 10.7 10*3/uL — ABNORMAL HIGH (ref 4.0–10.5)
nRBC: 0 % (ref 0.0–0.2)

## 2021-11-22 LAB — POCT FERN TEST: POCT Fern Test: NEGATIVE

## 2021-11-22 LAB — AMNISURE RUPTURE OF MEMBRANE (ROM) NOT AT ARMC: Amnisure ROM: POSITIVE

## 2021-11-22 LAB — WET PREP, GENITAL
Clue Cells Wet Prep HPF POC: NONE SEEN
Sperm: NONE SEEN
Trich, Wet Prep: NONE SEEN
WBC, Wet Prep HPF POC: <10 (ref <10)
Yeast Wet Prep HPF POC: NONE SEEN

## 2021-11-22 MED ORDER — SODIUM CHLORIDE 0.9 % IV SOLN
2.0000 g | Freq: Four times a day (QID) | INTRAVENOUS | Status: AC
  Administered 2021-11-22 – 2021-11-24 (×8): 2 g via INTRAVENOUS
  Filled 2021-11-22 (×8): qty 2000

## 2021-11-22 MED ORDER — SODIUM CHLORIDE 0.9 % IV SOLN
250.0000 mg | Freq: Four times a day (QID) | INTRAVENOUS | Status: AC
  Administered 2021-11-22 – 2021-11-24 (×8): 250 mg via INTRAVENOUS
  Filled 2021-11-22 (×10): qty 5

## 2021-11-22 MED ORDER — AMOXICILLIN 500 MG PO CAPS
500.0000 mg | ORAL_CAPSULE | Freq: Three times a day (TID) | ORAL | Status: AC
  Administered 2021-11-25 – 2021-11-29 (×15): 500 mg via ORAL
  Filled 2021-11-22 (×15): qty 1

## 2021-11-22 MED ORDER — MAGNESIUM SULFATE BOLUS VIA INFUSION
4.0000 g | Freq: Once | INTRAVENOUS | Status: AC
  Administered 2021-11-22: 4 g via INTRAVENOUS
  Filled 2021-11-22: qty 1000

## 2021-11-22 MED ORDER — LACTATED RINGERS IV SOLN: INTRAVENOUS | Status: DC

## 2021-11-22 MED ORDER — MAGNESIUM SULFATE 40 GM/1000ML IV SOLN
2.0000 g/h | INTRAVENOUS | Status: AC
  Administered 2021-11-23 (×2): 2 g/h via INTRAVENOUS
  Filled 2021-11-22 (×2): qty 1000

## 2021-11-22 MED ORDER — ERYTHROMYCIN BASE 250 MG PO TABS
250.0000 mg | ORAL_TABLET | Freq: Three times a day (TID) | ORAL | Status: AC
  Administered 2021-11-25 – 2021-11-29 (×15): 250 mg via ORAL
  Filled 2021-11-22 (×15): qty 1

## 2021-11-22 MED ORDER — PRENATAL MULTIVITAMIN CH
1.0000 | ORAL_TABLET | Freq: Every day | ORAL | Status: DC
  Administered 2021-11-23 – 2021-12-05 (×13): 1 via ORAL
  Filled 2021-11-22 (×14): qty 1

## 2021-11-22 MED ORDER — BETAMETHASONE SOD PHOS & ACET 6 (3-3) MG/ML IJ SUSP
12.0000 mg | INTRAMUSCULAR | Status: AC
  Administered 2021-11-22 – 2021-11-23 (×2): 12 mg via INTRAMUSCULAR
  Filled 2021-11-22: qty 5

## 2021-11-22 MED ORDER — CALCIUM CARBONATE ANTACID 500 MG PO CHEW: 2.0000 | CHEWABLE_TABLET | ORAL | Status: DC | PRN

## 2021-11-22 MED ORDER — ACETAMINOPHEN 325 MG PO TABS
650.0000 mg | ORAL_TABLET | ORAL | Status: DC | PRN
  Administered 2021-11-23 – 2021-12-04 (×10): 650 mg via ORAL
  Filled 2021-11-22 (×10): qty 2

## 2021-11-22 MED ORDER — DOCUSATE SODIUM 100 MG PO CAPS
100.0000 mg | ORAL_CAPSULE | Freq: Every day | ORAL | Status: DC
  Administered 2021-11-25 – 2021-11-30 (×5): 100 mg via ORAL
  Filled 2021-11-22 (×12): qty 1

## 2021-11-22 NOTE — MAU Note (Signed)
Pt aware + amnisure. Plan of care discussed with pt per Thalia Bloodgood CNM. [place] at bedside Alyssa [place] tech

## 2021-11-22 NOTE — MAU Note (Signed)
Ammnisure collected and sent per order. Pitcher of water provided for po hydration

## 2021-11-22 NOTE — MAU Note (Signed)
.  Carol Hendrix is a 21 y.o. at [redacted]w[redacted]d here in MAU reporting: LOF of clear watery for the last 2 days, and part of her mucus plug 3-4 days ago, and [employer] ctx for the last week that infrequent but more noticable. Pt reports that today she has had to change her underwear a few times due to the fluid leaking. Pt denies DFM, VB, abnormal discharge, PIH s/s, and complications in the pregnancy.  Didi Twins pregnancy Onset of complaint: 3 days Pain score: 0/10 Vitals:   11/22/21 1939  BP: 108/70  Pulse: 82  Resp: 18  Temp: 98.4 F (36.9 C)  SpO2: 100%     FHT:147/140 Lab orders placed from triage:  UA

## 2021-11-22 NOTE — MAU Note (Signed)
Patient texting on phone. Teary and crying. Support and reassurance given

## 2021-11-22 NOTE — H&P (Signed)
Carol Hendrix is a 21 y.o. female at [redacted]w[redacted]d with di-di twin gestation. She is presenting for positive Amnisure in the setting of leaking of fluid x 2 days. Liquid is "sticky" like her mucus plug from her previous pregnancies. She denies vaginal bleeding, DFM, dysuria or fever. She is remote from sexual intercourse. Pain score 0/10.   She receives prenatal care with [facility].  OB History     Gravida  3   Para  2   Term  2   Preterm  0   AB  0   Living  2      SAB  0   IAB  0   Ectopic  0   Multiple  0   Live Births  2          Past Medical History:  Diagnosis Date   Anemia    Asthma    Past Surgical History:  Procedure Laterality Date   NO PAST SURGERIES     Family History: family history includes Healthy in her father and mother; Pulmonary fibrosis in her maternal grandmother. Social History:  reports that she has quit smoking. Her smoking use included cigarettes. She smoked an average of .25 packs per day. She has never used smokeless tobacco. She reports that she does not currently use alcohol. She reports that she does not currently use drugs after having used the following drugs: Marijuana.     Maternal Diabetes: No HgbA1C ordered on admission Genetic Screening: Normal low risk female fraternal twins Maternal Ultrasounds/Referrals: Normal Fetal Ultrasounds or other Referrals:  None Maternal Substance Abuse:  No, hx tobacco use Significant Maternal Medications:  None Significant Maternal Lab Results:  None Number of Prenatal Visits:greater than 3 verified prenatal visits Other Comments:  None  Review of Systems  Genitourinary:  Positive for vaginal discharge.  All other systems reviewed and are negative.   Dilation: Closed Effacement (%): Thick Station: Ballotable Exam by:: Reita Cliche, CNM Blood pressure 108/70, pulse 82, temperature 98.4 F (36.9 C), temperature source Oral, resp. rate 18, height 5\' 5"  (1.651 m), weight 68 kg, last menstrual period  05/15/2021, SpO2 98 %.  Physical Exam Vitals and nursing note reviewed. Exam conducted with a chaperone present.  Constitutional:      General: She is not in acute distress.    Appearance: She is not ill-appearing.  Cardiovascular:     Rate and Rhythm: Normal rate and regular rhythm.     Pulses: Normal pulses.     Heart sounds: Normal heart sounds.  Pulmonary:     Effort: Pulmonary effort is normal.     Breath sounds: Normal breath sounds.  Abdominal:     Comments: Gravid  Genitourinary:    Comments: Pelvic exam: External genitalia normal, vaginal walls pink and well rugated, cervix visually closed, no lesions noted. Scant thin white discharge suspicious for Bacterial Vaginosis. Not foul smelling. Confirmed closed cervix with sterile digital exam Skin:    Capillary Refill: Capillary refill takes less than 2 seconds.  Neurological:     Mental Status: She is alert and oriented to person, place, and time.  Psychiatric:        Mood and Affect: Mood normal.        Behavior: Behavior normal.        Thought Content: Thought content normal.        Judgment: Judgment normal.     Prenatal labs: ABO, Rh: O/Positive/-- (04/19 1052) Antibody: Negative (04/19 1052) Rubella: 4.29 (04/19 1052) RPR:  Non Reactive (04/19 1052)  HBsAg: Negative (04/19 1052)  HIV: Non Reactive (04/19 1052)  GBS:   Unknown  Assessment/Plan: --21 y.o. G3P2002 at [redacted]w[redacted]d with di-di twin gestation --PPROM, Amnisure positive --Reactive tracing Baby A: baseline 135, mod var, + accels, no decels --Reactive tracing Baby B: baseline 145, mod var, + accels, no decels --Reassuring formal MFM ultrasound on admission --Per Dr. Jolayne Panther, admit to [facility]  Calvert Cantor, MSA, MSN, CNM 11/22/2021, 9:46 PM

## 2021-11-23 ENCOUNTER — Other Ambulatory Visit: Payer: Self-pay

## 2021-11-23 DIAGNOSIS — O42912 Preterm premature rupture of membranes, unspecified as to length of time between rupture and onset of labor, second trimester: Secondary | ICD-10-CM | POA: Diagnosis not present

## 2021-11-23 DIAGNOSIS — Z3A27 27 weeks gestation of pregnancy: Secondary | ICD-10-CM

## 2021-11-23 DIAGNOSIS — O30002 Twin pregnancy, unspecified number of placenta and unspecified number of amniotic sacs, second trimester: Secondary | ICD-10-CM | POA: Diagnosis not present

## 2021-11-23 DIAGNOSIS — Z419 Encounter for procedure for purposes other than remedying health state, unspecified: Secondary | ICD-10-CM | POA: Diagnosis not present

## 2021-11-23 DIAGNOSIS — O42919 Preterm premature rupture of membranes, unspecified as to length of time between rupture and onset of labor, unspecified trimester: Secondary | ICD-10-CM

## 2021-11-23 LAB — TYPE AND SCREEN
ABO/RH(D): O POS
Antibody Screen: NEGATIVE

## 2021-11-23 LAB — RPR: RPR Ser Ql: NONREACTIVE

## 2021-11-23 MED ORDER — ALBUTEROL SULFATE (2.5 MG/3ML) 0.083% IN NEBU
2.5000 mg | INHALATION_SOLUTION | RESPIRATORY_TRACT | Status: DC | PRN
Start: 2021-11-23 — End: 2021-12-06
  Administered 2021-11-24 (×2): 2.5 mg via RESPIRATORY_TRACT
  Filled 2021-11-23 (×2): qty 3

## 2021-11-23 MED ORDER — ALBUTEROL SULFATE HFA 108 (90 BASE) MCG/ACT IN AERS: 2.0000 | INHALATION_SPRAY | RESPIRATORY_TRACT | Status: DC | PRN

## 2021-11-23 NOTE — Consult Note (Signed)
Consultation Service: Neonatology   Dr. Elly Modena has asked for consultation on Carol Hendrix regarding the care of a premature infant at 27 weeks 3 days. Thank you for inviting [place] to see this patient.   Reason for consult:  Explain the possible complications, the prognosis, and the care of a premature infant at 74 and 3/7 weeks.  Chief complaint: 21 y.o. female with twin Di/Di IUPs with an estimated weight of 757g for infant A and 62g for infant B (last 7/17). Pregnancy has been complicated by PPROM.  Plan is for delivery via ceasarean section/vaginal delivery if possible.  My key findings of this patient's HPI are:  I have reviewed the patient's chart and have met with her. The salient information is as follows:   Mom is admitted to [facility] specialty care for PPROM and monitoring. No evidence of labor or fetal/maternal distress at this time. On latency antibiotics, BMZ, and continuous monitoring.    Prenatal labs:   Prenatal care:   good Pregnancy complications:  PPROM, THC use 3 months ago Maternal antibiotics: This patient's mother is not on file. Maternal Steroids: BMZ Most recent dose:  8/1   My recommendations for this patient and my actions included:   1. In the presence of the [name], I spent 15 minutes discussing the possible complications and outcomes of prematurity at this gestational age. I discussed specific complications at this gestational age referencing the need for resuscitation at birth due to respiratory distress which may require mechanical ventilation, CPAP, and surfactant administration. In addition infant may require IV fluids pending establishment of enteral feeds (encouraged breast milk feeding), antibiotics for possible sepsis, temperature support, and continuous monitoring. I also discussed the potential risk of complications such as intracranial hemorrhage, retinopathy, hearing deficit, and chronic lung disease. I discussed this with parents in detail and  they expressed an understanding of the risks and complications of prematurity.   2. I also discussed the expected survival of twin infants born at 58 weeks which is (Good). We further discussed that (Few) of the neonates born at this age have profound or severe neurological complications and school difficulties. In addition, (Some) of the neonates born at this age will have some for of mild to moderate neurological complications. She expressed an understanding of this information.   3. I informed her that the NICU team would be present at the delivery. She agreed that all appropriate medical measures could be taken to resuscitate her infant at the delivery. She also understood that our team will always be available for any questions that come up during their infant's hospitalization and we will continue to partner with their family to support them through this difficult time. Visitation policy was discussed and all questions were addressed.   Final Impression:  21 y.o. female with female 36 week Di/Di twin IUPs who is threatening to deliver and who now understands the possible complications and prognosis of her infant. The mother agrees with plan for resuscitation and ICU care. [name]'s questions were answered. She is planning to try and provide breast milk for her infant.    ______________________________________________________________________  Thank you for asking [place] to participate in the care of this patient. Please do not hesitate to contact us again if you are aware of any further ways we can be of assistance.   Sincerely,  [name], MD Attending Neonatologist   I spent ~35 minutes in consultation time, of which 15 minutes was spent in direct  face to face counseling.

## 2021-11-23 NOTE — Progress Notes (Signed)
Patient ID: Carol Hendrix, female   DOB: Jan 29, 2001, 21 y.o.   MRN: [id number] FACULTY PRACTICE ANTEPARTUM(COMPREHENSIVE) NOTE  Carol Hendrix is a 21 y.o. G3P2002 at [redacted]w[redacted]d  with Di-Di twins who is admitted for PROM.    Fetal presentation is cephalic and breech. Length of Stay:  1  Days  Date of admission:11/22/2021  Subjective: Patient reports feeling well. She denies contractions or vaginal bleeding. She reports good fetal movement x 2 and some occasional leakage of fluid .  Vitals:  Blood pressure 112/68, pulse 90, temperature 98.6 F (37 C), temperature source Oral, resp. rate 16, height 5\' 5"  (1.651 m), weight 68 kg, last menstrual period 05/15/2021, SpO2 100 %. Vitals:   11/23/21 0300 11/23/21 0407 11/23/21 0506 11/23/21 0602  BP: (!) 102/53 104/62 (!) 91/48 112/68  Pulse:  82 75 90  Resp: 16 16 16 16   Temp:      TempSrc:      SpO2: 100% 99%  100%  Weight:      Height:       Physical Examination: GENERAL: Well-developed, well-nourished female in no acute distress.  LUNGS: Clear to auscultation bilaterally.  HEART: Regular rate and rhythm. ABDOMEN: Soft, nontender, gravid PELVIC: Not performed EXTREMITIES: No cyanosis, clubbing, or edema, 2+ distal pulses.   Fetal Monitoring:  Baseline: 135/145 bpm, Variability: Good {> 6 bpm), Accelerations: Non-reactive but appropriate for gestational age, and Decelerations: Absent    Toco: no contractions  Labs:  Results for orders placed or performed during the hospital encounter of 11/22/21 (from the past 24 hour(s))  Urinalysis, Routine w reflex microscopic   Collection Time: 11/22/21  7:32 PM  Result Value Ref Range   Color, Urine YELLOW YELLOW   APPearance CLEAR CLEAR   Specific Gravity, Urine 1.012 1.005 - 1.030   pH 7.0 5.0 - 8.0   Glucose, UA NEGATIVE NEGATIVE mg/dL   Hgb urine dipstick NEGATIVE NEGATIVE   Bilirubin Urine NEGATIVE NEGATIVE   Ketones, ur NEGATIVE NEGATIVE mg/dL   Protein, ur NEGATIVE NEGATIVE mg/dL    Nitrite NEGATIVE NEGATIVE   Leukocytes,Ua NEGATIVE NEGATIVE  POCT fern test   Collection Time: 11/22/21  8:13 PM  Result Value Ref Range   POCT Fern Test Negative = intact amniotic membranes   Wet prep, genital   Collection Time: 11/22/21  8:14 PM   Specimen: Vaginal  Result Value Ref Range   Yeast Wet Prep HPF POC NONE SEEN NONE SEEN   Trich, Wet Prep NONE SEEN NONE SEEN   Clue Cells Wet Prep HPF POC NONE SEEN NONE SEEN   WBC, Wet Prep HPF POC <10 <10   Sperm NONE SEEN   Amnisure rupture of membrane (rom)not at [facility]   Collection Time: 11/22/21  8:14 PM  Result Value Ref Range   Amnisure ROM POSITIVE   Hemoglobin A1c   Collection Time: 11/22/21 10:19 PM  Result Value Ref Range   Hgb A1c MFr Bld 5.0 4.8 - 5.6 %   Mean Plasma Glucose 96.8 mg/dL  CBC   Collection Time: 11/22/21 10:19 PM  Result Value Ref Range   WBC 10.7 (H) 4.0 - 10.5 K/uL   RBC 3.60 (L) 3.87 - 5.11 MIL/uL   Hemoglobin 8.7 (L) 12.0 - 15.0 g/dL   HCT 11/24/21 (L) 11/24/21 - 73.7 %   MCV 77.5 (L) 80.0 - 100.0 fL   MCH 24.2 (L) 26.0 - 34.0 pg   MCHC 31.2 30.0 - 36.0 g/dL   RDW 10.6 26.9 - 48.5 %  Platelets 234 150 - 400 K/uL   nRBC 0.0 0.0 - 0.2 %  Type and screen [facility]   Collection Time: 11/22/21 10:23 PM  Result Value Ref Range   ABO/RH(D) O POS    Antibody Screen NEG    Sample Expiration      11/25/2021,2359 Performed at [facility], [address], [place], [place] [address]     Imaging Studies:    No results found.   Medications:  Scheduled  [START ON 11/24/2021] amoxicillin  500 mg Oral TID   betamethasone acetate-betamethasone sodium phosphate  12 mg Intramuscular Q24H   docusate sodium  100 mg Oral Daily   [START ON 11/24/2021] erythromycin  250 mg Oral TID   prenatal multivitamin  1 tablet Oral Q1200   I have reviewed the patient's current medications.  ASSESSMENT:  Patient Active Problem List   Diagnosis Date Noted   Preterm premature rupture of membranes (PPROM)  with unknown onset of labor 11/22/2021   Chlamydia 08/13/2021   Twin pregnancy, twins dichorionic and diamniotic 08/11/2021   Syncope 08/11/2021   Unwanted fertility 08/11/2021   Supervision of other normal pregnancy, antepartum 07/21/2021   Moderate persistent asthma 03/25/2018   Tobacco use disorder 03/25/2018    PLAN:  Latency antibiotics initiated Magnesium sulfate for neuro protection NICU consult ordered Betamethasone dose # 2 later this evening Continue inpatient monitoring for sign/symptoms of chorioamnionitis Discussed possible cesarean delivery due to fetal presentation cephalic/breech   Carol Hendrix 11/23/2021,7:44 AM

## 2021-11-24 DIAGNOSIS — Z3A28 28 weeks gestation of pregnancy: Secondary | ICD-10-CM

## 2021-11-24 DIAGNOSIS — O30042 Twin pregnancy, dichorionic/diamniotic, second trimester: Secondary | ICD-10-CM

## 2021-11-24 DIAGNOSIS — O42112 Preterm premature rupture of membranes, onset of labor more than 24 hours following rupture, second trimester: Secondary | ICD-10-CM | POA: Diagnosis not present

## 2021-11-24 DIAGNOSIS — Z3A27 27 weeks gestation of pregnancy: Secondary | ICD-10-CM | POA: Diagnosis not present

## 2021-11-24 MED ORDER — LACTATED RINGERS IV BOLUS
500.0000 mL | Freq: Once | INTRAVENOUS | Status: AC
Start: 2021-11-25 — End: 2021-11-24
  Administered 2021-11-24: 500 mL via INTRAVENOUS

## 2021-11-24 NOTE — Progress Notes (Signed)
Patient ID: Carol Hendrix, female   DOB: [birth year], 21 y.o.   MRN: [id number] [place]) NOTE  Carol Hendrix is a 21 y.o. G3P2002 at [redacted]w[redacted]d  with Di-Di twins who is admitted for PPROM.    Fetal presentation is cephalic and breech. Length of Stay:  2  Days  Date of admission:11/22/2021  Subjective: No complaints this morning.  She denies contractions or vaginal bleeding. She reports good fetal movement x 2 and some occasional leakage of fluid  Vitals:  Blood pressure (!) 96/41, pulse 94, temperature 97.9 F (36.6 C), temperature source Oral, resp. rate 16, height 5\' 5"  (1.651 m), weight 68 kg, last menstrual period 05/15/2021, SpO2 100 %. Vitals:   11/23/21 2200 11/23/21 2300 11/23/21 2338 11/24/21 0345  BP:   (!) 96/54 (!) 96/41  Pulse:   87 94  Resp: 16 16 16 16   Temp:   97.8 F (36.6 C) 97.9 F (36.6 C)  TempSrc:   Oral Oral  SpO2:   100% 100%  Weight:      Height:       Physical Examination: GENERAL: Well-developed, well-nourished female in no acute distress.  LUNGS: Clear to auscultation bilaterally.  HEART: Regular rate and rhythm. ABDOMEN: Soft, nontender, gravid PELVIC: Not performed EXTREMITIES: No cyanosis, clubbing, or edema, 2+ distal pulses.   Fetal Monitoring:  Baseline: 135/125 bpm, Variability: Moderate ; Accelerations: 10 x 10 appropriate for gestational age, and Decelerations: Absent  >> reassuring x 2  Toco: no contractions  Labs:  No results found for this or any previous visit (from the past 24 hour(s)).   Imaging Studies:    [place] MFM OB Limited  Result Date: 11/23/2021 ----------------------------------------------------------------------  OBSTETRICS REPORT                       (Signed Final 11/23/2021 04:57 pm) ---------------------------------------------------------------------- Patient Info  ID #:       [id number]                          D.O.B.:  29-May-2000 (21 yrs)  Name:       Carol Hendrix                   Visit Date:  11/22/2021 08:59 pm ---------------------------------------------------------------------- Performed By  Attending:        Johnell Comings MD         Ref. Address:     [address]  [place]                                                             [id number]  Performed By:     Hurman Horn          [facility]  Referred By:      [facility] ---------------------------------------------------------------------- Orders  #  Description                           Code        Ordered By  1  [place] MFM OB LIMITED                     01027.25    Clayton Bibles ----------------------------------------------------------------------  #  Order #                     Accession #                Episode #  1  [id number]                   [id number]                 [id number] ---------------------------------------------------------------------- Indications  Premature rupture of membranes - leaking       O42.90  fluid (pos amnisure)  [redacted] weeks gestation of pregnancy                Z3A.27  Twin pregnancy, di/di, second trimester        O30.042  LR NIPS Males/ Neg Horizon/ Neg AFP ---------------------------------------------------------------------- Fetal Evaluation (Fetus A)  Num Of Fetuses:         2  Fetal Heart Rate(bpm):  136  Cardiac Activity:       Observed  Fetal Lie:              Maternal right side  Presentation:           Cephalic  Placenta:               Anterior  P. Cord Insertion:      Visualized  Membrane Desc:      Dividing Membrane seen - Dichorionic.  Amniotic Fluid  AFI FV:      Within normal limits                              Largest Pocket(cm)  6.92   Comment:    Stomach, bladder, and diaphragm noted. ---------------------------------------------------------------------- OB History  Gravidity:    3         Term:   2  Living:       2 ---------------------------------------------------------------------- Gestational Age (Fetus A)  LMP:           27w 2d        Date:  05/15/21                  EDD:   02/19/22  Best:          27w 2d     Det. By:  LMP  (05/15/21)          EDD:   02/19/22 ---------------------------------------------------------------------- Fetal Evaluation (Fetus B)  Num Of Fetuses:         2  Fetal Heart Rate(bpm):  157  Cardiac Activity:       Observed  Fetal Lie:              Maternal left side  Presentation:           Breech  Placenta:               Anterior  P. Cord Insertion:      Visualized  Membrane Desc:      Dividing Membrane seen - Dichorionic.  Amniotic Fluid  AFI FV:      Within normal limits                              Largest Pocket(cm)                              6.61  Comment:    Stomach, bladder, and diaphragm seen. ---------------------------------------------------------------------- Gestational Age (Fetus B)  LMP:           27w 2d        Date:  05/15/21                  EDD:   02/19/22  Best:          27w 2d     Det. By:  LMP  (05/15/21)          EDD:   02/19/22 ---------------------------------------------------------------------- Cervix Uterus Adnexa  Cervix  Limited views appear closed, without funnelling.  Uterus  No abnormality visualized.  Right Ovary  Within normal limits.  Left Ovary  Within normal limits.  Adnexa  No abnormality visualized. ---------------------------------------------------------------------- Comments  This patient was admitted due to PPROM and a dichorionic,  diamniotic twin gestation.  Twin A: Normal amniotic fluid with a maximal vertical pocket  of 6.19 cm.  Vertex presentation.  Twin B: Normal amniotic fluid with a maximal vertical pocket  of 6.61 cm.  Breech presentation.  ----------------------------------------------------------------------                  Johnell Comings, MD Electronically Signed Final Report   11/23/2021 04:57 pm ----------------------------------------------------------------------    Medications:  Scheduled  [START ON 11/25/2021] amoxicillin  500 mg Oral TID   docusate sodium  100 mg Oral Daily   [START ON 11/25/2021] erythromycin  250 mg Oral TID   prenatal multivitamin  1 tablet Oral Q1200   I have reviewed the patient's current medications.  ASSESSMENT:  Patient Active Problem List   Diagnosis Date Noted   [redacted] weeks gestation  of pregnancy 11/24/2021   Preterm premature rupture of membranes (PPROM) in second trimester, antepartum 11/22/2021   Chlamydia 08/13/2021   Twin pregnancy, twins dichorionic and diamniotic 08/11/2021   Syncope 08/11/2021   Unwanted fertility 08/11/2021   Supervision of high-risk pregnancy 07/21/2021   Moderate persistent asthma 03/25/2018   Tobacco use disorder 03/25/2018    PLAN: Continue latency antibiotics She is s/p betamethasone x 2, magnesium sulfate for neuro protection NICU consult done, appreciate their input Continue inpatient monitoring for sign/symptoms of chorioamnionitis Discussed possible cesarean delivery due to fetal presentation cephalic/breech   Jaynie Collins, MD 11/24/2021,6:51 AM

## 2021-11-25 ENCOUNTER — Other Ambulatory Visit: Payer: Medicaid Other

## 2021-11-25 ENCOUNTER — Encounter: Payer: Medicaid Other | Admitting: Obstetrics & Gynecology

## 2021-11-25 DIAGNOSIS — Z3A27 27 weeks gestation of pregnancy: Secondary | ICD-10-CM | POA: Diagnosis not present

## 2021-11-25 DIAGNOSIS — Z3A28 28 weeks gestation of pregnancy: Secondary | ICD-10-CM | POA: Diagnosis not present

## 2021-11-25 DIAGNOSIS — O42112 Preterm premature rupture of membranes, onset of labor more than 24 hours following rupture, second trimester: Secondary | ICD-10-CM | POA: Diagnosis not present

## 2021-11-25 LAB — TYPE AND SCREEN
ABO/RH(D): O POS
Antibody Screen: NEGATIVE

## 2021-11-25 LAB — GC/CHLAMYDIA PROBE AMP (~~LOC~~) NOT AT ARMC
Chlamydia: NEGATIVE
Comment: NEGATIVE
Comment: NORMAL
Neisseria Gonorrhea: NEGATIVE

## 2021-11-25 LAB — CBC
HCT: 25.4 % — ABNORMAL LOW (ref 36.0–46.0)
Hemoglobin: 8.1 g/dL — ABNORMAL LOW (ref 12.0–15.0)
MCH: 24.8 pg — ABNORMAL LOW (ref 26.0–34.0)
MCHC: 31.9 g/dL (ref 30.0–36.0)
MCV: 77.7 fL — ABNORMAL LOW (ref 80.0–100.0)
Platelets: 232 10*3/uL (ref 150–400)
RBC: 3.27 MIL/uL — ABNORMAL LOW (ref 3.87–5.11)
RDW: 14.5 % (ref 11.5–15.5)
WBC: 13.7 10*3/uL — ABNORMAL HIGH (ref 4.0–10.5)
nRBC: 0 % (ref 0.0–0.2)

## 2021-11-25 NOTE — Progress Notes (Signed)
FACULTY PRACTICE ANTEPARTUM PROGRESS NOTE  Carol Hendrix is a 21 y.o. G3P2002 at [redacted]w[redacted]d who is admitted for PROM.  Estimated Date of Delivery: 02/19/22 Fetal presentation is  vtx/breech .  Length of Stay:  3 Days. Admitted 11/22/2021  Subjective: Pt seen doing well.  Pt denies fever/chills.  Pt denies abdominal pain. Patient reports normal fetal movement.  She had uterine contractions the previous evening which resolved with fluid bolus, none currently.She denies bleeding but notes continued leaking of fluid per vagina.  Vitals:  Blood pressure (!) 102/41, pulse 69, temperature 98.2 F (36.8 C), temperature source Oral, resp. rate 18, height 5\' 5"  (1.651 m), weight 68 kg, last menstrual period 05/15/2021, SpO2 100 %. Physical Examination: CONSTITUTIONAL: Well-developed, well-nourished female in no acute distress.  HENT:  Normocephalic, atraumatic, External right and left ear normal. Oropharynx is clear and moist EYES: Conjunctivae and EOM are normal.  NECK: Normal range of motion, supple, no masses. SKIN: Skin is warm and dry. No rash noted. Not diaphoretic. No erythema. No pallor. NEUROLGIC: Alert and oriented to person, place, and time. Normal reflexes, muscle tone coordination. No cranial nerve deficit noted. PSYCHIATRIC: Normal mood and affect. Normal behavior. Normal judgment and thought content. CARDIOVASCULAR: Normal heart rate noted, regular rhythm RESPIRATORY: Effort and breath sounds normal, no problems with respiration noted MUSCULOSKELETAL: Normal range of motion. No edema and no tenderness. ABDOMEN: Soft, nontender, nondistended, gravid. CERVIX: deferred  Fetal monitoring: FHR: A: 130's B: 120s , Variability: moderate x 2, Accelerations: Present, Decelerations: occasional variables , category 1 for gestational age Uterine activity: irregular contractions with irritability   No results found for this or any previous visit (from the past 48 hour(s)).  I have reviewed the  patient's current medications.  ASSESSMENT: Principal Problem:   Preterm premature rupture of membranes (PPROM) in second trimester, antepartum Active Problems:   Supervision of high-risk pregnancy   Twin pregnancy, twins dichorionic and diamniotic   Unwanted fertility   [redacted] weeks gestation of pregnancy   PLAN: Continue latency abx Monitor for signs of chorioamnionitis CBC in AM Continue daily fetal testing Delivery at 34 weeks pending maternal or fetal status changes Pt is s/ BMZ x 2 and magnesium sulfate for neuroprotection   Continue routine antenatal care.   05/17/2021, MD [facility] Faculty Attending, Center for [facility] 11/25/2021 10:27 AM

## 2021-11-25 NOTE — Progress Notes (Signed)
Pt on for second NST. Earlier today reactive. RN has been trying to hold monitors to get 2 different FHT but unable to do so consistently and pt desires sleep. Pt is asymptomatic - no bleeding or pain. Reviewed prior NST and appropriate for GA. Okay to take off the monitor.   Milas Hock, MD Attending Obstetrician & Gynecologist, [facility] for [facility], [facility]

## 2021-11-26 DIAGNOSIS — Z3A28 28 weeks gestation of pregnancy: Secondary | ICD-10-CM | POA: Diagnosis not present

## 2021-11-26 DIAGNOSIS — Z3A27 27 weeks gestation of pregnancy: Secondary | ICD-10-CM | POA: Diagnosis not present

## 2021-11-26 DIAGNOSIS — O42112 Preterm premature rupture of membranes, onset of labor more than 24 hours following rupture, second trimester: Secondary | ICD-10-CM | POA: Diagnosis not present

## 2021-11-26 LAB — CBC
HCT: 24.5 % — ABNORMAL LOW (ref 36.0–46.0)
Hemoglobin: 7.8 g/dL — ABNORMAL LOW (ref 12.0–15.0)
MCH: 24.4 pg — ABNORMAL LOW (ref 26.0–34.0)
MCHC: 31.8 g/dL (ref 30.0–36.0)
MCV: 76.6 fL — ABNORMAL LOW (ref 80.0–100.0)
Platelets: 212 10*3/uL (ref 150–400)
RBC: 3.2 MIL/uL — ABNORMAL LOW (ref 3.87–5.11)
RDW: 14.5 % (ref 11.5–15.5)
WBC: 12.1 10*3/uL — ABNORMAL HIGH (ref 4.0–10.5)
nRBC: 0 % (ref 0.0–0.2)

## 2021-11-26 MED ORDER — SODIUM CHLORIDE 0.9 % IV SOLN
500.0000 mg | Freq: Once | INTRAVENOUS | Status: AC
  Administered 2021-11-26: 500 mg via INTRAVENOUS
  Filled 2021-11-26: qty 25

## 2021-11-26 NOTE — Progress Notes (Signed)
FACULTY PRACTICE ANTEPARTUM PROGRESS NOTE  Carol Hendrix is a 21 y.o. G3P2002 at [redacted]w[redacted]d who is admitted for PROM.  Estimated Date of Delivery: 02/19/22 Fetal presentation is  vertex breech .  Length of Stay:  4 Days. Admitted 11/22/2021  Subjective: Pt resting quietly.  No acute complaints.  Positive fetal movement noted.  She denies uterine contractions, denies bleeding and notes continued leaking of fluid per vagina.  Vitals:  Blood pressure (!) 94/51, pulse 72, temperature 97.9 F (36.6 C), temperature source Oral, resp. rate 15, height 5\' 5"  (1.651 m), weight 68 kg, last menstrual period 05/15/2021, SpO2 100 %. Physical Examination: CONSTITUTIONAL: Well-developed, well-nourished female in no acute distress.  HENT:  Normocephalic, atraumatic, External right and left ear normal. Oropharynx is clear and moist EYES: Conjunctivae and EOM are normal.  NECK: Normal range of motion, supple, no masses. SKIN: Skin is warm and dry. No rash noted. Not diaphoretic. No erythema. No pallor. NEUROLGIC: Alert and oriented to person, place, and time. Normal reflexes, muscle tone coordination. No cranial nerve deficit noted. PSYCHIATRIC: Normal mood and affect. Normal behavior. Normal judgment and thought content. CARDIOVASCULAR: Normal heart rate noted, regular rhythm RESPIRATORY: Effort and breath sounds normal, no problems with respiration noted MUSCULOSKELETAL: Normal range of motion. No edema and no tenderness. ABDOMEN: Soft, nontender, nondistended, gravid. CERVIX: deferred   Results for orders placed or performed during the hospital encounter of 11/22/21 (from the past 48 hour(s))  CBC     Status: Abnormal   Collection Time: 11/25/21  9:10 PM  Result Value Ref Range   WBC 13.7 (H) 4.0 - 10.5 K/uL   RBC 3.27 (L) 3.87 - 5.11 MIL/uL   Hemoglobin 8.1 (L) 12.0 - 15.0 g/dL    Comment: Reticulocyte Hemoglobin testing may be clinically indicated, consider ordering this additional test 01/25/22     HCT 25.4 (L) 36.0 - 46.0 %   MCV 77.7 (L) 80.0 - 100.0 fL   MCH 24.8 (L) 26.0 - 34.0 pg   MCHC 31.9 30.0 - 36.0 g/dL   RDW [id number] 43.1 - 54.0 %   Platelets 232 150 - 400 K/uL   nRBC 0.0 0.0 - 0.2 %    Comment: Performed at [facility], [address], [place], [place]  Type and screen [facility]     Status: None   Collection Time: 11/25/21  9:10 PM  Result Value Ref Range   ABO/RH(D) O POS    Antibody Screen NEG    Sample Expiration      11/28/2021,2359 Performed at [facility], [address], [place], [place]   CBC     Status: Abnormal   Collection Time: 11/26/21  5:52 AM  Result Value Ref Range   WBC 12.1 (H) 4.0 - 10.5 K/uL   RBC 3.20 (L) 3.87 - 5.11 MIL/uL   Hemoglobin 7.8 (L) 12.0 - 15.0 g/dL    Comment: Reticulocyte Hemoglobin testing may be clinically indicated, consider ordering this additional test 01/26/22    HCT 24.5 (L) 36.0 - 46.0 %   MCV 76.6 (L) 80.0 - 100.0 fL   MCH 24.4 (L) 26.0 - 34.0 pg   MCHC 31.8 30.0 - 36.0 g/dL   RDW [id number] 98.3 - 38.2 %   Platelets 212 150 - 400 K/uL   nRBC 0.0 0.0 - 0.2 %    Comment: Performed at [facility], [address], [place], [place]    I  have reviewed the patient's current medications.  ASSESSMENT: Principal Problem:   Preterm premature rupture of membranes (PPROM) in second trimester, antepartum Active Problems:   Supervision of high-risk pregnancy   Twin pregnancy, twins dichorionic and diamniotic   Unwanted fertility   [redacted] weeks gestation of pregnancy   PLAN: No s/sx of chorioamnionitis Hbg, slightly low.  Will give iron infusion with venofer today. Continue latency abx Delivery at 34 weeks or with maternal/ fetal indications Will try to get BTL form signed while in house   Continue routine antenatal care.   Mariel Aloe, MD [facility] Faculty Attending, Center for [facility] 11/26/2021 9:53 AM

## 2021-11-27 DIAGNOSIS — O42112 Preterm premature rupture of membranes, onset of labor more than 24 hours following rupture, second trimester: Secondary | ICD-10-CM | POA: Diagnosis not present

## 2021-11-27 NOTE — Progress Notes (Signed)
Patient ID: ELKY FUNCHES, female   DOB: 06/26/2000, 21 y.o.   MRN: [id number] FACULTY PRACTICE ANTEPARTUM(COMPREHENSIVE) NOTE  Carol Hendrix is a 21 y.o. [id number] with Estimated Date of Delivery: 02/19/22   By  early ultrasound [redacted]w[redacted]d  who is admitted for PROM.    Fetal presentation is cephalic./breech Length of Stay:  5  Days  Date of admission:11/22/2021  Subjective: No complaints, intermitten sporadic contractions Patient reports the fetal movement as active. X 2 Patient reports uterine contraction  activity as sporadic. Patient reports  vaginal bleeding as none. Patient describes fluid per vagina as Clear.  Vitals:  Blood pressure 110/63, pulse 83, temperature 98 F (36.7 C), temperature source Oral, resp. rate 18, height 5\' 5"  (1.651 m), weight 68 kg, last menstrual period 05/15/2021, SpO2 100 %. Vitals:   11/26/21 1536 11/26/21 1952 11/26/21 2308 11/27/21 0813  BP: (!) 110/59 117/62 112/64 110/63  Pulse: 83 87 73 83  Resp: 18 18 18 18   Temp: 97.7 F (36.5 C) 97.7 F (36.5 C) 97.9 F (36.6 C) 98 F (36.7 C)  TempSrc: Oral Oral Oral Oral  SpO2: 100% 100% 100% 100%  Weight:      Height:       Physical Examination:  General appearance - alert, well appearing, and in no distress Abdomen - soft, nontender, nondistended, no masses or organomegaly Fundal Height:  size equals dates Pelvic Exam:  examination not indicated Cervical Exam: Not evaluated.  Extremities: extremities normal, atraumatic, no cyanosis or edema with DTRs 2+ bilaterally Membranes:ruptured, clear fluid  Fetal Monitoring:  Baseline: 140 x 2 bpm, Variability: Good {> 6 bpm), Accelerations: Reactive, and Decelerations: Absent   reactive for both twins  Labs:  No results found for this or any previous visit (from the past 24 hour(s)).  Imaging Studies:    No results found.   Medications:  Scheduled  amoxicillin  500 mg Oral TID   docusate sodium  100 mg Oral Daily   erythromycin  250 mg Oral TID   prenatal  multivitamin  1 tablet Oral Q1200   I have reviewed the patient's current medications.  ASSESSMENT: 01/27/22 [redacted]w[redacted]d Estimated Date of Delivery: 02/19/22  PPROM 11/22/21 Vertex/breech   PLAN: >S/P BMZ x 2 >S/P NICU consult >S/P Mg prophylaxis >on latency antibiotics >Delivery mode provider dependent >Continue in house observation/management  02/21/22 Carol Hendrix 11/27/2021,8:51 AM

## 2021-11-28 DIAGNOSIS — Z3A28 28 weeks gestation of pregnancy: Secondary | ICD-10-CM | POA: Diagnosis not present

## 2021-11-28 DIAGNOSIS — O42112 Preterm premature rupture of membranes, onset of labor more than 24 hours following rupture, second trimester: Secondary | ICD-10-CM | POA: Diagnosis not present

## 2021-11-28 DIAGNOSIS — Z3A27 27 weeks gestation of pregnancy: Secondary | ICD-10-CM | POA: Diagnosis not present

## 2021-11-28 LAB — TYPE AND SCREEN
ABO/RH(D): O POS
Antibody Screen: NEGATIVE

## 2021-11-28 LAB — CBC
HCT: 24.9 % — ABNORMAL LOW (ref 36.0–46.0)
Hemoglobin: 8.1 g/dL — ABNORMAL LOW (ref 12.0–15.0)
MCH: 24.7 pg — ABNORMAL LOW (ref 26.0–34.0)
MCHC: 32.5 g/dL (ref 30.0–36.0)
MCV: 75.9 fL — ABNORMAL LOW (ref 80.0–100.0)
Platelets: 233 10*3/uL (ref 150–400)
RBC: 3.28 MIL/uL — ABNORMAL LOW (ref 3.87–5.11)
RDW: 14.6 % (ref 11.5–15.5)
WBC: 18.4 10*3/uL — ABNORMAL HIGH (ref 4.0–10.5)
nRBC: 0.4 % — ABNORMAL HIGH (ref 0.0–0.2)

## 2021-11-28 NOTE — Progress Notes (Signed)
Pt is with family on patio, stated she did not want IV placed right now, wants to have it done later.  Primary RN was notified and she will place another consult when patient is in room and ready for IV placement.

## 2021-11-28 NOTE — Progress Notes (Signed)
FACULTY PRACTICE ANTEPARTUM PROGRESS NOTE  Carol Hendrix is a 21 y.o. G3P2002 at [redacted]w[redacted]d who is admitted for PROM.  Estimated Date of Delivery: 02/19/22 Fetal presentation is  vertex/breech .  Length of Stay:  6 Days. Admitted 11/22/2021  Subjective: Pt seen doing well.  She denies fever/chills.  There is no abdominal pain. Patient reports normal fetal movement x 2.  She denies uterine contractions and denies bleeding.  She has continued mild leaking of fluid per vagina.  Vitals:  Blood pressure (!) 110/54, pulse 86, temperature 97.9 F (36.6 C), temperature source Oral, resp. rate 15, height 5\' 5"  (1.651 m), weight 68 kg, last menstrual period 05/15/2021, SpO2 100 %. Physical Examination: CONSTITUTIONAL: Well-developed, well-nourished female in no acute distress.  HENT:  Normocephalic, atraumatic, External right and left ear normal. Oropharynx is clear and moist EYES: Conjunctivae and EOM are normal.  NECK: Normal range of motion, supple, no masses. SKIN: Skin is warm and dry. No rash noted. Not diaphoretic. No erythema. No pallor. NEUROLGIC: Alert and oriented to person, place, and time. Normal reflexes, muscle tone coordination. No cranial nerve deficit noted. PSYCHIATRIC: Normal mood and affect. Normal behavior. Normal judgment and thought content. CARDIOVASCULAR: Normal heart rate noted, regular rhythm RESPIRATORY: Effort and breath sounds normal, no problems with respiration noted MUSCULOSKELETAL: Normal range of motion. No edema and no tenderness. ABDOMEN: Soft, nontender, nondistended, gravid. CERVIX: deferred  Fetal monitoring: FHR: A 140s, B 140s bpm, Variability: moderate, Accelerations: Present, Decelerations: Absent , category 1 for gestational age x 2 Uterine activity: mild irritability   No results found for this or any previous visit (from the past 48 hour(s)).  I have reviewed the patient's current medications.  ASSESSMENT: Principal Problem:   Preterm premature  rupture of membranes (PPROM) in second trimester, antepartum Active Problems:   Supervision of high-risk pregnancy   Twin pregnancy, twins dichorionic and diamniotic   Unwanted fertility   [redacted] weeks gestation of pregnancy   PLAN: Pt is s/p BMZ x 2 and magnesium sulfate for neuroprotection She has one more day of latency antibiotics. Currently no s/sx of PTL or chorioamnionitis Recheck CBC in AM Expectant management until 34 weeks Delivery earlier depending on maternal/fetal status. Delivery route determined closer to time of delivery, depends on fetal position and provider comfort with possible breech delivery.   Continue routine antenatal care.   05/17/2021, MD [facility] Faculty Attending, [facility] 11/28/2021 10:44 AM

## 2021-11-29 ENCOUNTER — Inpatient Hospital Stay (HOSPITAL_BASED_OUTPATIENT_CLINIC_OR_DEPARTMENT_OTHER): Payer: Medicaid Other

## 2021-11-29 DIAGNOSIS — Z3A28 28 weeks gestation of pregnancy: Secondary | ICD-10-CM | POA: Diagnosis not present

## 2021-11-29 DIAGNOSIS — O42913 Preterm premature rupture of membranes, unspecified as to length of time between rupture and onset of labor, third trimester: Secondary | ICD-10-CM | POA: Diagnosis not present

## 2021-11-29 DIAGNOSIS — O30042 Twin pregnancy, dichorionic/diamniotic, second trimester: Secondary | ICD-10-CM | POA: Diagnosis not present

## 2021-11-29 DIAGNOSIS — O30043 Twin pregnancy, dichorionic/diamniotic, third trimester: Secondary | ICD-10-CM

## 2021-11-29 DIAGNOSIS — Z3A27 27 weeks gestation of pregnancy: Secondary | ICD-10-CM | POA: Diagnosis not present

## 2021-11-29 DIAGNOSIS — O42112 Preterm premature rupture of membranes, onset of labor more than 24 hours following rupture, second trimester: Secondary | ICD-10-CM | POA: Diagnosis not present

## 2021-11-29 MED ORDER — CYCLOBENZAPRINE HCL 10 MG PO TABS
10.0000 mg | ORAL_TABLET | Freq: Three times a day (TID) | ORAL | Status: DC | PRN
  Administered 2021-11-29: 10 mg via ORAL
  Filled 2021-11-29: qty 1

## 2021-11-29 NOTE — Progress Notes (Signed)
Patient ID: Carol Hendrix, female   DOB: 08-27-2000, 21 y.o.   MRN: [id number] FACULTY PRACTICE ANTEPARTUM(COMPREHENSIVE) NOTE  Carol Hendrix is a 21 y.o. G3P2002 at [redacted]w[redacted]d  with Di-Di twins who is admitted for PPROM.    Fetal presentation is cephalic and breech. Length of Stay:  7  Days  Date of admission:11/22/2021  Subjective: No complaints this morning.  She denies contractions or vaginal bleeding. She reports good fetal movement x 2 and some occasional leakage of fluid  Vitals:  Blood pressure 112/62, pulse 72, temperature 98 F (36.7 C), temperature source Oral, resp. rate 18, height 5\' 5"  (1.651 m), weight 68 kg, last menstrual period 05/15/2021, SpO2 97 %. Vitals:   11/28/21 1629 11/28/21 1954 11/28/21 2205 11/29/21 0437  BP: 109/61 (!) 109/54  112/62  Pulse: 91 88  72  Resp: 18 16  18   Temp: 98.1 F (36.7 C)  98.1 F (36.7 C) 98 F (36.7 C)  TempSrc: Oral  Oral Oral  SpO2: 100% 99%  97%  Weight:      Height:       Physical Examination: GENERAL: Well-developed, well-nourished female in no acute distress.  LUNGS: Clear to auscultation bilaterally.  HEART: Regular rate and rhythm. ABDOMEN: Soft, nontender, gravid PELVIC: Not performed EXTREMITIES: No cyanosis, clubbing, or edema, 2+ distal pulses.   Fetal Monitoring:  Baseline: 145/150 bpm, Variability: Moderate ; Accelerations: 10 x 10 appropriate for gestational age, and Decelerations: Absent  >> reassuring x 2  Toco: no contractions  Labs:     Latest Ref Rng & Units 11/28/2021    8:40 PM 11/26/2021    5:52 AM 11/25/2021    9:10 PM  CBC  WBC 4.0 - 10.5 K/uL 18.4  12.1  13.7   Hemoglobin 12.0 - 15.0 g/dL 8.1  7.8  8.1   Hematocrit 36.0 - 46.0 % 24.9  24.5  25.4   Platelets 150 - 400 K/uL 233  212  232     Imaging Studies:    No results found.   Medications:  Scheduled  amoxicillin  500 mg Oral TID   docusate sodium  100 mg Oral Daily   erythromycin  250 mg Oral TID   prenatal multivitamin  1 tablet Oral Q1200    I have reviewed the patient's current medications.  ASSESSMENT:  Patient Active Problem List   Diagnosis Date Noted   [redacted] weeks gestation of pregnancy 11/24/2021   Preterm premature rupture of membranes (PPROM) in second trimester, antepartum 11/22/2021   Chlamydia 08/13/2021   Twin pregnancy, twins dichorionic and diamniotic 08/11/2021   Syncope 08/11/2021   Unwanted fertility 08/11/2021   Supervision of high-risk pregnancy 07/21/2021   Moderate persistent asthma 03/25/2018   Tobacco use disorder 03/25/2018    PLAN: Continue latency antibiotics as ordered She is s/p betamethasone x 2, magnesium sulfate for neuro protection NICU consult already done, appreciate their input Continue inpatient monitoring for sign/symptoms of chorioamnionitis.  Concerned about elevated WBC but no fevers, increased maternal/fetal HR or uterine tenderness. Will continue to monitor closely. Discussed possible cesarean delivery due to fetal presentation of cephalic/breech   14/04/2017, MD 11/29/2021,7:17 AM

## 2021-11-30 DIAGNOSIS — O42112 Preterm premature rupture of membranes, onset of labor more than 24 hours following rupture, second trimester: Secondary | ICD-10-CM | POA: Diagnosis not present

## 2021-11-30 MED ORDER — MAGNESIUM OXIDE -MG SUPPLEMENT 400 (240 MG) MG PO TABS
400.0000 mg | ORAL_TABLET | Freq: Every day | ORAL | Status: DC
  Administered 2021-11-30 – 2021-12-05 (×5): 400 mg via ORAL
  Filled 2021-11-30 (×6): qty 1

## 2021-11-30 MED ORDER — VITAMIN B-12 100 MCG PO TABS
100.0000 ug | ORAL_TABLET | Freq: Every day | ORAL | Status: DC
  Administered 2021-11-30 – 2021-12-05 (×6): 100 ug via ORAL
  Filled 2021-11-30 (×6): qty 1

## 2021-11-30 NOTE — Progress Notes (Signed)
Patient ID: Carol Hendrix, female   DOB: 03/30/2001, 21 y.o.   MRN: [id number] [place]) NOTE  Carol Hendrix is a 21 y.o. [id number] with Estimated Date of Delivery: 02/19/22   By  early ultrasound [redacted]w[redacted]d  who is admitted for PROM.  DiDi twin pregnancy  Fetal presentation is  cephalic/breech . Length of Stay:  8  Days  Date of admission:11/22/2021  Subjective: No complaints, headache has resolved Patient reports the fetal movement as active. Patient reports uterine contraction  activity as none. Patient reports  vaginal bleeding as none. Patient describes fluid per vagina as Clear.  Vitals:  Blood pressure (!) 107/57, pulse 76, temperature 98.5 F (36.9 C), temperature source Oral, resp. rate 15, height 5\' 5"  (1.651 m), weight 68 kg, last menstrual period 05/15/2021, SpO2 100 %. Vitals:   11/29/21 1548 11/29/21 1934 11/30/21 0520 11/30/21 0747  BP: 115/70 101/70 114/66 (!) 107/57  Pulse: 85 97 76 76  Resp: 16 18 16 15   Temp: 98.3 F (36.8 C) 98.3 F (36.8 C) 98.2 F (36.8 C) 98.5 F (36.9 C)  TempSrc: Oral Oral Oral Oral  SpO2: 100% 98% 99% 100%  Weight:      Height:       Physical Examination:  General appearance - alert, well appearing, and in no distress Abdomen - soft, nontender, nondistended, no masses or organomegaly Fundal Height:  size equals dates Pelvic Exam:  examination not indicated Cervical Exam: Not evaluated. . Extremities: extremities normal, atraumatic, no cyanosis or edema with DTRs 2+ bilaterally Membranes:ruptured, clear fluid  Fetal Monitoring:  Baseline: 140 x2 bpm, Variability: Good {> 6 bpm), Accelerations: Reactive, and Decelerations: Absent   reactive for both twins  Labs:  No results found for this or any previous visit (from the past 24 hour(s)).  Imaging Studies:    [place] MFM OB LIMITED  Result Date: 11/29/2021 ----------------------------------------------------------------------  OBSTETRICS REPORT                        (Signed Final 11/29/2021 05:03 pm) ---------------------------------------------------------------------- Patient Info  ID #:       [id number]                          D.O.B.:  Apr 24, 2001 (21 yrs)  Name:       Carol Hendrix                   Visit Date: 11/29/2021 02:01 pm ---------------------------------------------------------------------- Performed By  Attending:        Sander Nephew      Ref. Address:     [address]  [address]                                                             [place]                                                             [id number]  Performed By:     Anabel Halon          [facility]  Referred By:      [facility] ---------------------------------------------------------------------- Orders  #  Description                           Code        Ordered By  1  [place] MFM OB LIMITED                     15176.16    Duane Lope ----------------------------------------------------------------------  #  Order #                     Accession #                Episode #  1  [id number]                   [id number]                 [id number] ---------------------------------------------------------------------- Indications  Premature rupture of membranes - leaking       O42.90  fluid  [redacted] weeks gestation of pregnancy                Z3A.28 ---------------------------------------------------------------------- Fetal Evaluation (Fetus A)  Num Of Fetuses:         2  Fetal Heart Rate(bpm):  161  Cardiac Activity:       Observed  Presentation:           Cephalic  Placenta:               Anterior  P. Cord Insertion:      Previously Visualized  Membrane Desc:      Dividing Membrane seen - Dichorionic.  Amniotic Fluid  AFI FV:      Within normal limits                               Largest Pocket(cm)                              4.33 ---------------------------------------------------------------------- OB History  Gravidity:    3  Term:   2  Living:       2 ---------------------------------------------------------------------- Gestational Age (Fetus A)  LMP:           28w 2d        Date:  05/15/21                  EDD:   02/19/22  Best:          Timothy Lasso 2d     Det. By:  LMP  (05/15/21)          EDD:   02/19/22 ---------------------------------------------------------------------- Anatomy (Fetus A)  Diaphragm:             Appears normal         Kidneys:                Appear normal  Stomach:               Appears normal, left   Bladder:                Appears normal                         sided  Other:  Technically difficult due to fetal position. ---------------------------------------------------------------------- Fetal Evaluation (Fetus B)  Num Of Fetuses:         2  Fetal Heart Rate(bpm):  141  Cardiac Activity:       Observed  Presentation:           Breech  Placenta:               Anterior  P. Cord Insertion:      Previously Visualized  Membrane Desc:      Dividing Membrane seen - Dichorionic.  Amniotic Fluid  AFI FV:      Within normal limits                              Largest Pocket(cm)                              4.57 ---------------------------------------------------------------------- Gestational Age (Fetus B)  LMP:           28w 2d        Date:  05/15/21                  EDD:   02/19/22  Best:          Timothy Lasso 2d     Det. By:  LMP  (05/15/21)          EDD:   02/19/22 ---------------------------------------------------------------------- Anatomy (Fetus B)  Diaphragm:             Appears normal         Kidneys:                Appear normal  Stomach:               Appears normal, left   Bladder:                Appears normal                         sided  Other:  Technically difficult due to fetal position.  ---------------------------------------------------------------------- Impression  Limited exam to assess amniotic fluid in a twin pregnancy.  Normal MVP x 2 . ---------------------------------------------------------------------- Recommendations  Management per inpatient. ----------------------------------------------------------------------              Lin Landsman, MD Electronically Signed Final Report   11/29/2021 05:03 pm ----------------------------------------------------------------------    Medications:  Scheduled  docusate sodium  100 mg Oral Daily   magnesium oxide  400 mg Oral Daily   prenatal multivitamin  1 tablet Oral Q1200   vitamin B-12  100 mcg Oral Daily   I have reviewed the patient's current medications.  ASSESSMENT: [id number] [redacted]w[redacted]d Estimated Date of Delivery: 02/19/22  Patient Active Problem List   Diagnosis Date Noted   [redacted] weeks gestation of pregnancy 11/24/2021   Preterm premature rupture of membranes (PPROM) in second trimester, antepartum 11/22/2021   Chlamydia 08/13/2021   Twin pregnancy, twins dichorionic and diamniotic 08/11/2021   Syncope 08/11/2021   Unwanted fertility 08/11/2021   Supervision of high-risk pregnancy 07/21/2021   Moderate persistent asthma 03/25/2018   Tobacco use disorder 03/25/2018    PLAN:No change in management plan >S/P BMZ x 2 >S/P NICU consult >S/P Mg prophylaxis >on latency antibiotics >Delivery mode provider dependent >Continue in house observation/management  Omelia Blackwater H Joie Reamer 11/30/2021,9:51 AM

## 2021-11-30 NOTE — Progress Notes (Signed)
Initial Nutrition Assessment  DOCUMENTATION CODES:   Not applicable  INTERVENTION:  Regular Diet Pt may order double protein portions and snacks TID if she makes request when ordering meals   NUTRITION DIAGNOSIS:   Increased nutrient needs related to  (twin pregnancy/ increase needs to support fetal growth) as evidenced by  (28 weeks twin pregnancy).  GOAL:   Patient will meet greater than or equal to 90% of their needs, Weight gain  MONITOR:   Weight trends  REASON FOR ASSESSMENT:   Antenatal, LOS    ASSESSMENT:   Now 28/3 adm due to PROM of Di-Di twins. Weight at 12 weeks 60 kg, BMI 22. 7.9 kg weight gain to date. 7/31 A1C 5. 8/6 Hct 24.9 %. IV iron supp, B12   Diet Order:   Diet Order             Diet regular Room service appropriate? Yes; Fluid consistency: Thin  Diet effective now                   EDUCATION NEEDS:   No education needs have been identified at this time  Skin:  Skin Assessment: Reviewed RN Assessment    Height:   Ht Readings from Last 1 Encounters:  11/22/21 5\' 5"  (1.651 m)    Weight:   Wt Readings from Last 1 Encounters:  11/22/21 68 kg    Ideal Body Weight:   125 lbs  BMI:  Body mass index is 24.94 kg/m.  Estimated Nutritional Needs:   Kcal:  2400-2500  Protein:  105-115 g  Fluid:  2.5 L

## 2021-12-01 DIAGNOSIS — O42112 Preterm premature rupture of membranes, onset of labor more than 24 hours following rupture, second trimester: Secondary | ICD-10-CM | POA: Diagnosis not present

## 2021-12-01 MED ORDER — METAXALONE 800 MG PO TABS
800.0000 mg | ORAL_TABLET | Freq: Three times a day (TID) | ORAL | Status: DC | PRN
Start: 2021-12-01 — End: 2021-12-06
  Administered 2021-12-01: 800 mg via ORAL
  Filled 2021-12-01 (×2): qty 1

## 2021-12-01 NOTE — Progress Notes (Signed)
CSW received a consult for meal vouchers.  CSW met with patient at bedside. CSW introduced self and explained reason for consult. Patient reported that cafeteria staff reported that she could get meal vouchers. CSW explained that while patient is admitted she can order from the hospital menu and no meal vouchers could be provided at this time. MOB verbalized understanding and denied any additional needs.   Abundio Miu, [place] Worker [facility] Cell#: [phone]

## 2021-12-01 NOTE — Progress Notes (Signed)
Patient ID: Carol Hendrix, female   DOB: 2001-01-16, 21 y.o.   MRN: [id number] Patient ID: Carol Hendrix, female   DOB: [birth year], 21 y.o.   MRN: [id number] FACULTY PRACTICE ANTEPARTUM(COMPREHENSIVE) NOTE  Carol Hendrix is a 21 y.o. [id number] with Estimated Date of Delivery: 02/19/22   By  early ultrasound [redacted]w[redacted]d  who is admitted for PROM.  DiDi twin pregnancy  Fetal presentation is  cephalic/breech . Length of Stay:  9  Days  Date of admission:11/22/2021  Subjective: No complaints, headache has resolved Patient reports the fetal movement as active. Patient reports uterine contraction  activity as none. Patient reports  vaginal bleeding as none. Patient describes fluid per vagina as Clear.  Vitals:  Blood pressure 104/63, pulse (!) 102, temperature 98.4 F (36.9 C), temperature source Oral, resp. rate 18, height 5\' 5"  (1.651 m), weight 68 kg, last menstrual period 05/15/2021, SpO2 100 %. Vitals:   11/30/21 1624 11/30/21 1945 12/01/21 0109 12/01/21 1052  BP: 114/69 120/65 114/61 104/63  Pulse: 97 92 87 (!) 102  Resp: 19 18 20 18   Temp: 98 F (36.7 C) 98 F (36.7 C) 98 F (36.7 C) 98.4 F (36.9 C)  TempSrc: Oral Oral Oral Oral  SpO2: 100% 100% 100% 100%  Weight:      Height:       Physical Examination:  General appearance - alert, well appearing, and in no distress Abdomen - soft, nontender, nondistended, no masses or organomegaly Fundal Height:  size equals dates Pelvic Exam:  examination not indicated Cervical Exam: Not evaluated. . Extremities: extremities normal, atraumatic, no cyanosis or edema with DTRs 2+ bilaterally Membranes:ruptured, clear fluid  Fetal Monitoring:  Baseline: 140 x2 bpm, Variability: Good {> 6 bpm), Accelerations: Reactive, and Decelerations: Absent   reactive for both twins  Labs:  No results found for this or any previous visit (from the past 24 hour(s)).  Imaging Studies:    01/31/22 MFM OB LIMITED  Result Date:  11/29/2021 ----------------------------------------------------------------------  OBSTETRICS REPORT                       (Signed Final 11/29/2021 05:03 pm) ---------------------------------------------------------------------- Patient Info  ID #:       01/29/2022                          D.O.B.:  Sep 22, 2000 (21 yrs)  Name:       Carol Hendrix                   Visit Date: 11/29/2021 02:01 pm ---------------------------------------------------------------------- Performed By  Attending:        [name]      Ref. Address:     [address]                    [place]                                                             [address]  [address]                                                             [place]                                                             [address]  Performed By:     Anabel Halon          [facility]  Referred By:      [facility] ---------------------------------------------------------------------- Orders  #  Description                           Code        Ordered By  1  [place] MFM OB LIMITED                     26712.45    Duane Lope ----------------------------------------------------------------------  #  Order #                     Accession #                Episode #  1  [id number]                   [id number]                 [id number] ---------------------------------------------------------------------- Indications  Premature rupture of membranes - leaking       O42.90  fluid  [redacted] weeks gestation of pregnancy                Z3A.28 ---------------------------------------------------------------------- Fetal Evaluation (Fetus A)  Num Of Fetuses:         2  Fetal Heart Rate(bpm):  161  Cardiac Activity:       Observed  Presentation:           Cephalic  Placenta:               Anterior  P. Cord Insertion:      Previously Visualized   Membrane Desc:      Dividing Membrane seen - Dichorionic.  Amniotic Fluid  AFI FV:      Within normal limits                              Largest Pocket(cm)                              4.33 ---------------------------------------------------------------------- OB History  Gravidity:    3  Term:   2  Living:       2 ---------------------------------------------------------------------- Gestational Age (Fetus A)  LMP:           28w 2d        Date:  05/15/21                  EDD:   02/19/22  Best:          Carol Hendrix 2d     Det. By:  LMP  (05/15/21)          EDD:   02/19/22 ---------------------------------------------------------------------- Anatomy (Fetus A)  Diaphragm:             Appears normal         Kidneys:                Appear normal  Stomach:               Appears normal, left   Bladder:                Appears normal                         sided  Other:  Technically difficult due to fetal position. ---------------------------------------------------------------------- Fetal Evaluation (Fetus B)  Num Of Fetuses:         2  Fetal Heart Rate(bpm):  141  Cardiac Activity:       Observed  Presentation:           Breech  Placenta:               Anterior  P. Cord Insertion:      Previously Visualized  Membrane Desc:      Dividing Membrane seen - Dichorionic.  Amniotic Fluid  AFI FV:      Within normal limits                              Largest Pocket(cm)                              4.57 ---------------------------------------------------------------------- Gestational Age (Fetus B)  LMP:           28w 2d        Date:  05/15/21                  EDD:   02/19/22  Best:          Carol Hendrix 2d     Det. By:  LMP  (05/15/21)          EDD:   02/19/22 ---------------------------------------------------------------------- Anatomy (Fetus B)  Diaphragm:             Appears normal         Kidneys:                Appear normal  Stomach:               Appears normal, left   Bladder:                Appears normal                          sided  Other:  Technically difficult due to fetal position. ---------------------------------------------------------------------- Impression  Limited exam to assess amniotic fluid in a twin pregnancy.  Normal MVP x 2 . ---------------------------------------------------------------------- Recommendations  Management per inpatient. ----------------------------------------------------------------------              Lin Landsman, MD Electronically Signed Final Report   11/29/2021 05:03 pm ----------------------------------------------------------------------    Medications:  Scheduled  docusate sodium  100 mg Oral Daily   magnesium oxide  400 mg Oral Daily   prenatal multivitamin  1 tablet Oral Q1200   vitamin B-12  100 mcg Oral Daily   I have reviewed the patient's current medications.  ASSESSMENT: [id number] [redacted]w[redacted]d  Estimated Date of Delivery: 02/19/22  Patient Active Problem List   Diagnosis Date Noted   [redacted] weeks gestation of pregnancy 11/24/2021   Preterm premature rupture of membranes (PPROM) in second trimester, antepartum 11/22/2021   Chlamydia 08/13/2021   Twin pregnancy, twins dichorionic and diamniotic 08/11/2021   Syncope 08/11/2021   Unwanted fertility 08/11/2021   Supervision of high-risk pregnancy 07/21/2021   Moderate persistent asthma 03/25/2018   Tobacco use disorder 03/25/2018   Trapezius trigger point headaches  PLAN:No change in management plan >S/P BMZ x 2 >S/P NICU consult >S/P Mg prophylaxis >on latency antibiotics >Delivery mode provider dependent >Continue in house observation/management  Add skelaxin(which is non sedating) to help with her trapezius triggers, much of which is from laying in bed, I have encouraged her to sit up in bed and in chair, k pad ordered as well, BP is normal    Lazaro Arms 12/01/2021,11:03 AM

## 2021-12-02 DIAGNOSIS — O30043 Twin pregnancy, dichorionic/diamniotic, third trimester: Secondary | ICD-10-CM

## 2021-12-02 DIAGNOSIS — O42112 Preterm premature rupture of membranes, onset of labor more than 24 hours following rupture, second trimester: Secondary | ICD-10-CM | POA: Diagnosis not present

## 2021-12-02 DIAGNOSIS — Z3A29 29 weeks gestation of pregnancy: Secondary | ICD-10-CM | POA: Diagnosis not present

## 2021-12-02 LAB — OB RESULTS CONSOLE GBS: GBS: NEGATIVE

## 2021-12-02 LAB — TYPE AND SCREEN
ABO/RH(D): O POS
Antibody Screen: NEGATIVE

## 2021-12-02 MED ORDER — SODIUM CHLORIDE 0.9% FLUSH
3.0000 mL | Freq: Two times a day (BID) | INTRAVENOUS | Status: DC
  Administered 2021-12-02 – 2021-12-05 (×6): 3 mL via INTRAVENOUS

## 2021-12-02 NOTE — Progress Notes (Signed)
Patient ID: [name], female   DOB: 09-12-2000, 21 y.o.   MRN: [id number] FACULTY PRACTICE ANTEPARTUM(COMPREHENSIVE) NOTE  Carol Hendrix is a 21 y.o. [id number] with Estimated Date of Delivery: 02/19/22   By  early ultrasound [redacted]w[redacted]d  who is admitted for PROM.    Fetal presentation is cephalic and breech . Length of Stay:  10  Days  Date of admission:11/22/2021  Subjective: No complaints Patient reports the fetal movement as active. For both twins Patient reports uterine contraction  activity as rare. Patient reports  vaginal bleeding as none. Patient describes fluid per vagina as Clear.  Vitals:  Blood pressure (!) 91/56, pulse 92, temperature 98.5 F (36.9 C), temperature source Oral, resp. rate 17, height 5\' 5"  (1.651 m), weight 68 kg, last menstrual period 05/15/2021, SpO2 100 %. Vitals:   12/01/21 1628 12/01/21 2038 12/02/21 0458 12/02/21 0937  BP: 114/67 111/60 (!) 92/55 (!) 91/56  Pulse: 91 90 86 92  Resp: 18 18 16 17   Temp: 98 F (36.7 C) 98.3 F (36.8 C) 98.3 F (36.8 C) 98.5 F (36.9 C)  TempSrc: Oral Oral Oral Oral  SpO2: 100% 100% 100% 100%  Weight:      Height:       Physical Examination:  General appearance - alert, well appearing, and in no distress Abdomen - soft, nontender, nondistended, no masses or organomegaly Fundal Height:  size equals dates Pelvic Exam:  examination not indicated Cervical Exam: Not evaluated. . Extremities: extremities normal, atraumatic, no cyanosis or edema with DTRs 2+ bilaterally Membranes:ruptured, clear fluid  Fetal Monitoring:  Baseline: 130+140 bpm, Variability: Good {> 6 bpm), Accelerations: Reactive, and Decelerations: Absent   reactive  Labs:  Results for orders placed or performed during the hospital encounter of 11/22/21 (from the past 24 hour(s))  Type and screen [facility]   Collection Time: 12/02/21  4:54 AM  Result Value Ref Range   ABO/RH(D) O POS    Antibody Screen NEG    Sample Expiration       12/05/2021,2359 Performed at [facility], [address], [place], [address] [place]     Imaging Studies:    [place] MFM OB LIMITED  Result Date: 11/29/2021 ----------------------------------------------------------------------  OBSTETRICS REPORT                       (Signed Final 11/29/2021 05:03 pm) ---------------------------------------------------------------------- Patient Info  ID #:       01/29/2022                          D.O.B.:  [birth year] (21 yrs)  Name:       Carol Hendrix                   Visit Date: 11/29/2021 02:01 pm ---------------------------------------------------------------------- Performed By  Attending:        Myrla Hendrix      Ref. Address:     [address]                    [place]                                                             [address]  [address]                                                             [place]                                                             [address]  Performed By:     Carol Hendrix          [facility]  Referred By:      [facility] ---------------------------------------------------------------------- Orders  #  Description                           Code        Ordered By  1  [place] MFM OB LIMITED                     38101.75    Carol Hendrix ----------------------------------------------------------------------  #  Order #                     Accession #                Episode #  1  [id number]                   [id number]                 [id number] ---------------------------------------------------------------------- Indications  Premature rupture of membranes - leaking       O42.90  fluid  [redacted] weeks gestation of pregnancy                Z3A.28 ---------------------------------------------------------------------- Fetal Evaluation (Fetus A)  Num Of Fetuses:         2  Fetal Heart Rate(bpm):   161  Cardiac Activity:       Observed  Presentation:           Cephalic  Placenta:               Anterior  P. Cord Insertion:      Previously Visualized  Membrane Desc:      Dividing Membrane seen - Dichorionic.  Amniotic Fluid  AFI FV:      Within normal limits                              Largest Pocket(cm)                              4.33 ---------------------------------------------------------------------- OB History  Gravidity:    3  Term:   2  Living:       2 ---------------------------------------------------------------------- Gestational Age (Fetus A)  LMP:           28w 2d        Date:  05/15/21                  EDD:   02/19/22  Best:          Carol Hendrix 2d     Det. By:  LMP  (05/15/21)          EDD:   02/19/22 ---------------------------------------------------------------------- Anatomy (Fetus A)  Diaphragm:             Appears normal         Kidneys:                Appear normal  Stomach:               Appears normal, left   Bladder:                Appears normal                         sided  Other:  Technically difficult due to fetal position. ---------------------------------------------------------------------- Fetal Evaluation (Fetus B)  Num Of Fetuses:         2  Fetal Heart Rate(bpm):  141  Cardiac Activity:       Observed  Presentation:           Breech  Placenta:               Anterior  P. Cord Insertion:      Previously Visualized  Membrane Desc:      Dividing Membrane seen - Dichorionic.  Amniotic Fluid  AFI FV:      Within normal limits                              Largest Pocket(cm)                              4.57 ---------------------------------------------------------------------- Gestational Age (Fetus B)  LMP:           28w 2d        Date:  05/15/21                  EDD:   02/19/22  Best:          Carol Hendrix 2d     Det. By:  LMP  (05/15/21)          EDD:   02/19/22 ---------------------------------------------------------------------- Anatomy (Fetus B)  Diaphragm:             Appears  normal         Kidneys:                Appear normal  Stomach:               Appears normal, left   Bladder:                Appears normal                         sided  Other:  Technically difficult due to fetal position. ---------------------------------------------------------------------- Impression  Limited exam to assess amniotic fluid in a twin pregnancy.  Normal MVP x 2 . ---------------------------------------------------------------------- Recommendations  Management per inpatient. ----------------------------------------------------------------------              Lin Landsman, MD Electronically Signed Final Report   11/29/2021 05:03 pm ----------------------------------------------------------------------    Medications:  Scheduled  docusate sodium  100 mg Oral Daily   magnesium oxide  400 mg Oral Daily   prenatal multivitamin  1 tablet Oral Q1200   vitamin B-12  100 mcg Oral Daily   I have reviewed the patient's current medications.  ASSESSMENT: [id number] [redacted]w[redacted]d Estimated Date of Delivery: 02/19/22  Patient Active Problem List   Diagnosis Date Noted   [redacted] weeks gestation of pregnancy 11/24/2021   Preterm premature rupture of membranes (PPROM) in second trimester, antepartum 11/22/2021   Chlamydia 08/13/2021   Twin pregnancy, twins dichorionic and diamniotic 08/11/2021   Syncope 08/11/2021   Unwanted fertility 08/11/2021   Supervision of high-risk pregnancy 07/21/2021   Moderate persistent asthma 03/25/2018   Tobacco use disorder 03/25/2018    PLAN:   PLAN:No change in management plan >S/P BMZ x 2 >S/P NICU consult >S/P Mg prophylaxis >on latency antibiotics >Delivery mode provider dependent >Continue in house observation/management >Skelaxin is working well for her trapezius trigger point based headaches  Mahin Guardia H Cherith Tewell 12/02/2021,10:47 AM

## 2021-12-03 DIAGNOSIS — Z3A29 29 weeks gestation of pregnancy: Secondary | ICD-10-CM | POA: Diagnosis not present

## 2021-12-03 DIAGNOSIS — O30043 Twin pregnancy, dichorionic/diamniotic, third trimester: Secondary | ICD-10-CM | POA: Diagnosis not present

## 2021-12-03 DIAGNOSIS — O42112 Preterm premature rupture of membranes, onset of labor more than 24 hours following rupture, second trimester: Secondary | ICD-10-CM | POA: Diagnosis not present

## 2021-12-03 MED ORDER — NYSTATIN 100000 UNIT/ML MT SUSP
5.0000 mL | Freq: Three times a day (TID) | OROMUCOSAL | Status: DC
Start: 2021-12-03 — End: 2021-12-06
  Administered 2021-12-03 – 2021-12-05 (×8): 500000 [IU] via ORAL
  Filled 2021-12-03 (×10): qty 5

## 2021-12-03 NOTE — Plan of Care (Signed)
  Problem: Activity: Goal: Risk for activity intolerance will decrease Outcome: Completed/Met   Problem: Nutrition: Goal: Adequate nutrition will be maintained Outcome: Completed/Met   Problem: Coping: Goal: Level of anxiety will decrease Outcome: Completed/Met   Problem: Elimination: Goal: Will not experience complications related to urinary retention Outcome: Completed/Met   Problem: Safety: Goal: Ability to remain free from injury will improve Outcome: Completed/Met   Problem: Education: Goal: Knowledge of disease or condition will improve Outcome: Completed/Met

## 2021-12-03 NOTE — Progress Notes (Signed)
Patient ID: Carol Hendrix, female   DOB: 2000/06/20, 21 y.o.   MRN: [id number] FACULTY PRACTICE ANTEPARTUM(COMPREHENSIVE) NOTE  Carol Hendrix is a 21 y.o. [id number] with Estimated Date of Delivery: 02/19/22   By  early ultrasound [redacted]w[redacted]d  who is admitted for PROM.    Fetal presentation is cephalic and breech . Length of Stay:  11 Days  Date of admission:11/22/2021  Subjective: White plaque on her tongue Patient reports the fetal movement as active. For both twins Patient reports uterine contraction  activity as rare. Patient reports  vaginal bleeding as none. Patient describes fluid per vagina as Clear.  Vitals:  Blood pressure 122/64, pulse 89, temperature 98.3 F (36.8 C), temperature source Oral, resp. rate 16, height 5\' 5"  (1.651 m), weight 70.3 kg, last menstrual period 05/15/2021, SpO2 99 %. Vitals:   12/02/21 2257 12/03/21 0102 12/03/21 0800 12/03/21 0850  BP: 122/64     Pulse: 89     Resp: 20  17 16   Temp: 98.3 F (36.8 C)     TempSrc: Oral     SpO2: 99%     Weight:  70.3 kg    Height:  5\' 5"  (1.651 m)     Physical Examination:  General appearance - alert, well appearing, and in no distress Abdomen - soft, nontender, nondistended, no masses or organomegaly Fundal Height:  size equals dates Pelvic Exam:  examination not indicated Cervical Exam: Not evaluated. . Extremities: extremities normal, atraumatic, no cyanosis or edema with DTRs 2+ bilaterally Membranes:ruptured, clear fluid  Fetal Monitoring:  Baseline: 130+140 bpm, Variability: Good {> 6 bpm), Accelerations: Reactive, and Decelerations: Absent   reactive  Labs:  No results found for this or any previous visit (from the past 24 hour(s)).   Imaging Studies:    02/02/22 MFM OB LIMITED  Result Date: 11/29/2021 ----------------------------------------------------------------------  OBSTETRICS REPORT                       (Signed Final 11/29/2021 05:03 pm)  ---------------------------------------------------------------------- Patient Info  ID #:       [place]                          D.O.B.:  [birth year] (21 yrs)  Name:       Carol Hendrix                   Visit Date: 11/29/2021 02:01 pm ---------------------------------------------------------------------- Performed By  Attending:        [birth year]      Ref. Address:     [address]                    [place]                                                             [address]                                                             [phone]  [place]                                                             [address]  Performed By:     Anabel Halon          [facility]  Referred By:      [facility] ---------------------------------------------------------------------- Orders  #  Description                           Code        Ordered By  1  [place] MFM OB LIMITED                     85462.70    Duane Lope ----------------------------------------------------------------------  #  Order #                     Accession #                Episode #  1  [id number]                   [id number]                 [id number] ---------------------------------------------------------------------- Indications  Premature rupture of membranes - leaking       O42.90  fluid  [redacted] weeks gestation of pregnancy                Z3A.28 ---------------------------------------------------------------------- Fetal Evaluation (Fetus A)  Num Of Fetuses:         2  Fetal Heart Rate(bpm):  161  Cardiac Activity:       Observed  Presentation:           Cephalic  Placenta:               Anterior  P. Cord Insertion:      Previously Visualized  Membrane Desc:      Dividing Membrane seen - Dichorionic.  Amniotic Fluid  AFI FV:      Within normal limits                              Largest Pocket(cm)                               4.33 ---------------------------------------------------------------------- OB History  Gravidity:    3         Term:   2  Living:       2 ---------------------------------------------------------------------- Gestational Age (Fetus A)  LMP:           28w 2d        Date:  05/15/21  EDD:   02/19/22  Best:          Timothy Lasso 2d     Det. By:  LMP  (05/15/21)          EDD:   02/19/22 ---------------------------------------------------------------------- Anatomy (Fetus A)  Diaphragm:             Appears normal         Kidneys:                Appear normal  Stomach:               Appears normal, left   Bladder:                Appears normal                         sided  Other:  Technically difficult due to fetal position. ---------------------------------------------------------------------- Fetal Evaluation (Fetus B)  Num Of Fetuses:         2  Fetal Heart Rate(bpm):  141  Cardiac Activity:       Observed  Presentation:           Breech  Placenta:               Anterior  P. Cord Insertion:      Previously Visualized  Membrane Desc:      Dividing Membrane seen - Dichorionic.  Amniotic Fluid  AFI FV:      Within normal limits                              Largest Pocket(cm)                              4.57 ---------------------------------------------------------------------- Gestational Age (Fetus B)  LMP:           28w 2d        Date:  05/15/21                  EDD:   02/19/22  Best:          Timothy Lasso 2d     Det. By:  LMP  (05/15/21)          EDD:   02/19/22 ---------------------------------------------------------------------- Anatomy (Fetus B)  Diaphragm:             Appears normal         Kidneys:                Appear normal  Stomach:               Appears normal, left   Bladder:                Appears normal                         sided  Other:  Technically difficult due to fetal position. ---------------------------------------------------------------------- Impression  Limited exam  to assess amniotic fluid in a twin pregnancy.  Normal MVP x 2 . ---------------------------------------------------------------------- Recommendations  Management per inpatient. ----------------------------------------------------------------------              Sander Nephew, MD Electronically Signed Final Report   11/29/2021 05:03 pm ----------------------------------------------------------------------    Medications:  Scheduled  docusate sodium  100 mg Oral Daily   magnesium oxide  400  mg Oral Daily   nystatin  5 mL Oral TID   prenatal multivitamin  1 tablet Oral Q1200   sodium chloride flush  3 mL Intravenous Q12H   vitamin B-12  100 mcg Oral Daily   I have reviewed the patient's current medications.  ASSESSMENT: [id number] [redacted]w[redacted]d Estimated Date of Delivery: 02/19/22  Patient Active Problem List   Diagnosis Date Noted   [redacted] weeks gestation of pregnancy 11/24/2021   Preterm premature rupture of membranes (PPROM) in second trimester, antepartum 11/22/2021   Chlamydia 08/13/2021   Twin pregnancy, twins dichorionic and diamniotic 08/11/2021   Syncope 08/11/2021   Unwanted fertility 08/11/2021   Supervision of high-risk pregnancy 07/21/2021   Moderate persistent asthma 03/25/2018   Tobacco use disorder 03/25/2018    PLAN:   PLAN:No change in management plan >S/P BMZ x 2 >S/P NICU consult >S/P Mg prophylaxis >on latency antibiotics >Delivery mode provider dependent >Continue in house observation/management >Skelaxin is working well for her trapezius trigger point based headaches  >Nystatin oral TID for post antibiotic thrush  >2 hour glucola next Wednesday 12/08/21  Carol Hendrix 12/03/2021,9:37 AM    Patient ID: Carol Hendrix, female   DOB: 2001/04/04, 21 y.o.   MRN: [id number]

## 2021-12-04 DIAGNOSIS — Z3A29 29 weeks gestation of pregnancy: Secondary | ICD-10-CM | POA: Diagnosis not present

## 2021-12-04 DIAGNOSIS — O42112 Preterm premature rupture of membranes, onset of labor more than 24 hours following rupture, second trimester: Secondary | ICD-10-CM | POA: Diagnosis not present

## 2021-12-04 DIAGNOSIS — O30043 Twin pregnancy, dichorionic/diamniotic, third trimester: Secondary | ICD-10-CM | POA: Diagnosis not present

## 2021-12-04 LAB — TYPE AND SCREEN
ABO/RH(D): O POS
Antibody Screen: NEGATIVE

## 2021-12-04 LAB — CULTURE, BETA STREP (GROUP B ONLY)

## 2021-12-04 NOTE — Progress Notes (Signed)
Patient ID: Carol Hendrix, female   DOB: 2000/06/02, 21 y.o.   MRN: [id number] FACULTY PRACTICE ANTEPARTUM(COMPREHENSIVE) NOTE  Carol Hendrix is a 21 y.o. [id number] with Estimated Date of Delivery: 02/19/22   By  early ultrasound [redacted]w[redacted]d   who is admitted for PROM.    Fetal presentation is cephalic and breech . Length of Stay:  12 Days  Date of admission:11/22/2021  Subjective: White plaque on her tongue Patient reports the fetal movement as active. For both twins Patient reports uterine contraction  activity as rare. Patient reports  vaginal bleeding as none. Patient describes fluid per vagina as Clear.  Vitals:  Blood pressure (!) 100/56, pulse 88, temperature 98.3 F (36.8 C), temperature source Oral, resp. rate 16, height 5\' 5"  (1.651 m), weight 70.3 kg, last menstrual period 05/15/2021, SpO2 100 %. Vitals:   12/03/21 2115 12/04/21 0025 12/04/21 0444 12/04/21 0804  BP: 121/67 (!) 109/58 101/68 (!) 100/56  Pulse: 98 89 85 88  Resp: 18 16 16 16   Temp: 98 F (36.7 C) 98.3 F (36.8 C) 98.3 F (36.8 C)   TempSrc: Oral Oral Oral Oral  SpO2:  99% 100% 100%  Weight:      Height:       Physical Examination:  General appearance - alert, well appearing, and in no distress Abdomen - soft, nontender, nondistended, no masses or organomegaly Fundal Height:  size equals dates Pelvic Exam:  examination not indicated Cervical Exam: Not evaluated. . Extremities: extremities normal, atraumatic, no cyanosis or edema with DTRs 2+ bilaterally Membranes:ruptured, clear fluid  Fetal Monitoring:  Baseline: 130+140 bpm, Variability: Good {> 6 bpm), Accelerations: Reactive, and Decelerations: Absent   reactive  Labs:  No results found for this or any previous visit (from the past 24 hour(s)).   Imaging Studies:    No results found.   Medications:  Scheduled  docusate sodium  100 mg Oral Daily   magnesium oxide  400 mg Oral Daily   nystatin  5 mL Oral TID   prenatal multivitamin  1 tablet Oral  Q1200   sodium chloride flush  3 mL Intravenous Q12H   vitamin B-12  100 mcg Oral Daily   I have reviewed the patient's current medications.  ASSESSMENT: 02/03/22 [redacted]w[redacted]d Estimated Date of Delivery: 02/19/22  Patient Active Problem List   Diagnosis Date Noted   [redacted] weeks gestation of pregnancy 11/24/2021   Preterm premature rupture of membranes (PPROM) in second trimester, antepartum 11/22/2021   Chlamydia 08/13/2021   Twin pregnancy, twins dichorionic and diamniotic 08/11/2021   Syncope 08/11/2021   Unwanted fertility 08/11/2021   Supervision of high-risk pregnancy 07/21/2021   Moderate persistent asthma 03/25/2018   Tobacco use disorder 03/25/2018    PLAN:   PLAN:No change in management plan >S/P BMZ x 2 >S/P NICU consult >S/P Mg prophylaxis >on latency antibiotics >Delivery mode provider dependent >Continue in house observation/management >Skelaxin is working well for her trapezius trigger point based headaches  >Nystatin oral TID for post antibiotic thrush  >2 hour glucola next Wednesday 12/08/21  Tuesday 12/04/2021,10:44 AM    Patient ID: Carol Hendrix, female   DOB: [birth year], 21 y.o.   MRN: 10/01/1999 Patient ID: Carol Hendrix, female   DOB: 01/04/2001, 21 y.o.   MRN: 10/01/1999

## 2021-12-05 DIAGNOSIS — O42112 Preterm premature rupture of membranes, onset of labor more than 24 hours following rupture, second trimester: Secondary | ICD-10-CM | POA: Diagnosis not present

## 2021-12-05 NOTE — Progress Notes (Signed)
Pt. expressed wanting to leave AMA to this RN. Dr. Para March notified at 1947 who suggested for pt. to stay for the night and discuss decision in the morning with MFM. Pt. notified of suggestion along with the importance of other prenatal care needing to be completed. Pt. verbalized understanding. This RN reiterated s/s and complications of PTL. Pt. verbalized understanding and expressed final decision to leave AMA. Dr. Alysia Penna notified of final decision at 2100. Pt. left unit at 2110.

## 2021-12-05 NOTE — Progress Notes (Signed)
Patient ID: Carol Hendrix, female   DOB: 2000/07/02, 21 y.o.   MRN: [id number] ACULTY PRACTICE ANTEPARTUM COMPREHENSIVE PROGRESS NOTE  Carol Hendrix is a 21 y.o G3P2002 at [redacted]w[redacted]d  who is admitted for PROM.   Fetal presentation is cephalic and breech. Length of Stay:  13  Days  Subjective: Pt has no complaints this morning Patient reports good fetal movement.  She reports no uterine contractions, no bleeding and occ clear LOF.  Vitals:  Blood pressure (!) 122/58, pulse (!) 103, temperature 98.9 F (37.2 C), temperature source Oral, resp. rate 18, height 5\' 5"  (1.651 m), weight 70.3 kg, last menstrual period 05/15/2021, SpO2 99 %. Physical Examination: Lungs clear Heart RRR Abd soft + BS gravid non tender GU deferred Ext non tender  Fetal Monitoring:  Baseline: 130 bpm, Variability: Good {> 6 bpm), and Accelerations: Reactive  Labs:  Results for orders placed or performed during the hospital encounter of 11/22/21 (from the past 24 hour(s))  Type and screen [facility]   Collection Time: 12/04/21  9:33 PM  Result Value Ref Range   ABO/RH(D) O POS    Antibody Screen NEG    Sample Expiration      12/07/2021,2359 Performed at [facility], [address], [place], [place]     Imaging Studies:    NA   Medications:  Scheduled  docusate sodium  100 mg Oral Daily   magnesium oxide  400 mg Oral Daily   nystatin  5 mL Oral TID   prenatal multivitamin  1 tablet Oral Q1200   sodium chloride flush  3 mL Intravenous Q12H   vitamin B-12  100 mcg Oral Daily   I have reviewed the patient's current medications.  ASSESSMENT: IUP 29 1/7 weeks PROM DI/DI twins Malpresentation    PLAN: Stable. S/P antibiotics, Mag, BMZ. No S/Sx of PTL or infection. Delivery at 34 weeks or for maternal/fetal indications Continue routine antenatal care.   3/7 12/05/2021,7:36 AM

## 2021-12-06 ENCOUNTER — Ambulatory Visit: Payer: Medicaid Other

## 2021-12-06 ENCOUNTER — Telehealth: Payer: Self-pay | Admitting: *Deleted

## 2021-12-06 ENCOUNTER — Inpatient Hospital Stay (HOSPITAL_COMMUNITY)
Admission: AD | Admit: 2021-12-06 | Discharge: 2021-12-07 | Disposition: A | Discharge status: Home / Self Care | Payer: Medicaid Other | Attending: Obstetrics and Gynecology | Admitting: Obstetrics and Gynecology

## 2021-12-06 ENCOUNTER — Encounter (HOSPITAL_COMMUNITY): Payer: Self-pay | Admitting: Obstetrics and Gynecology

## 2021-12-06 DIAGNOSIS — Z3A29 29 weeks gestation of pregnancy: Secondary | ICD-10-CM | POA: Insufficient documentation

## 2021-12-06 DIAGNOSIS — O42913 Preterm premature rupture of membranes, unspecified as to length of time between rupture and onset of labor, third trimester: Secondary | ICD-10-CM | POA: Insufficient documentation

## 2021-12-06 DIAGNOSIS — B3731 Acute candidiasis of vulva and vagina: Secondary | ICD-10-CM

## 2021-12-06 DIAGNOSIS — O26893 Other specified pregnancy related conditions, third trimester: Secondary | ICD-10-CM | POA: Insufficient documentation

## 2021-12-06 DIAGNOSIS — O42919 Preterm premature rupture of membranes, unspecified as to length of time between rupture and onset of labor, unspecified trimester: Secondary | ICD-10-CM

## 2021-12-06 DIAGNOSIS — O30043 Twin pregnancy, dichorionic/diamniotic, third trimester: Secondary | ICD-10-CM | POA: Diagnosis not present

## 2021-12-06 LAB — URINALYSIS, ROUTINE W REFLEX MICROSCOPIC
Bilirubin Urine: NEGATIVE
Glucose, UA: NEGATIVE mg/dL
Hgb urine dipstick: NEGATIVE
Ketones, ur: NEGATIVE mg/dL
Leukocytes,Ua: NEGATIVE
Nitrite: NEGATIVE
Protein, ur: NEGATIVE mg/dL
Specific Gravity, Urine: 1.028 (ref 1.005–1.030)
pH: 6 (ref 5.0–8.0)

## 2021-12-06 LAB — WET PREP, GENITAL
Clue Cells Wet Prep HPF POC: NONE SEEN
Sperm: NONE SEEN
Trich, Wet Prep: NONE SEEN
WBC, Wet Prep HPF POC: >=10 — AB (ref <10)
Yeast Wet Prep HPF POC: NONE SEEN

## 2021-12-06 NOTE — Telephone Encounter (Signed)
TC from pt asking about scheduling GTT and US's. Pt is 29.[redacted] wks GA with Di/Di twins and pPROM. Pt signed out AMA from OB High Risk unit 12/05/21 due to lack of childcare. Dr. Jolayne Panther was consulted and recommends: strongly encouraging pt to return to [facility] if or when it becomes possible, schedule pt for ROB with GTT ASAP, MD will decide on [place] f/u at that time. In addition, we will reach out to [facility] [employer] for childcare resources and support for pt. Pt is scheduled for GTT and ROB on 12/08/21 with Dr. Alysia Penna. Pt verbalized understanding. Emotional support provided.

## 2021-12-06 NOTE — MAU Note (Signed)
Pt says SROM at 27 weeks - 1 baby - admitted D/C yesterday  Also has a lot of vag D/C-started  Sat while here  Also has   burning with urination -started today  Denies UC's

## 2021-12-07 DIAGNOSIS — B3731 Acute candidiasis of vulva and vagina: Secondary | ICD-10-CM

## 2021-12-07 DIAGNOSIS — Z3A29 29 weeks gestation of pregnancy: Secondary | ICD-10-CM | POA: Diagnosis not present

## 2021-12-07 LAB — GC/CHLAMYDIA PROBE AMP (~~LOC~~) NOT AT ARMC
Chlamydia: NEGATIVE
Comment: NEGATIVE
Comment: NORMAL
Neisseria Gonorrhea: NEGATIVE

## 2021-12-07 MED ORDER — FLUCONAZOLE 150 MG PO TABS: 150.0000 mg | ORAL_TABLET | Freq: Every day | ORAL | 1 refills | Status: DC

## 2021-12-07 NOTE — Discharge Summary (Signed)
Patient ID: Carol Hendrix MRN: [id number] DOB/AGE: [birth year] 21 y.o.  Admit date: 11/22/2021 Discharge date: 12/07/2021  Admission Diagnoses: IUP 27 2/7 weeks, DI/Di twins, PROM twin A  Discharge Diagnoses: SAA, pt left AMA at 22 1/7 weeks  Prenatal Procedures: ultrasound  Consults: Neonatology, Maternal Fetal Medicine  Hospital Course:  This is a 21 y.o. [id number] with IUP at [redacted]w[redacted]d admitted for above Dx. Was given IV and oral antibiotics as per PROM protocol. Received BMZ for FLM. Pt left AMA on 12/05/2021  Discharge Exam: Temp:  [98.7 F (37.1 C)] 98.7 F (37.1 C) (08/14 2301) Pulse Rate:  [104] 104 (08/14 2301) Resp:  [18] 18 (08/14 2301) BP: (113)/(67) 113/67 (08/14 2301) Weight:  [71.7 kg] 71.7 kg (08/14 2301) Physical Examination: Not completed as pt left AMA   Significant Diagnostic Studies:  Results for orders placed or performed during the hospital encounter of 12/06/21 (from the past 168 hour(s))  GC/Chlamydia probe amp ([place])not at [facility]   Collection Time: 12/06/21 10:47 PM  Result Value Ref Range   Neisseria Gonorrhea Negative    Chlamydia Negative    Comment Normal Reference Ranger Chlamydia - Negative    Comment      Normal Reference Range Neisseria Gonorrhea - Negative  Wet prep, genital   Collection Time: 12/06/21 11:13 PM  Result Value Ref Range   Yeast Wet Prep HPF POC NONE SEEN NONE SEEN   Trich, Wet Prep NONE SEEN NONE SEEN   Clue Cells Wet Prep HPF POC NONE SEEN NONE SEEN   WBC, Wet Prep HPF POC >=10 (A) <10   Sperm NONE SEEN   Urinalysis, Routine w reflex microscopic   Collection Time: 12/06/21 11:14 PM  Result Value Ref Range   Color, Urine YELLOW YELLOW   APPearance CLEAR CLEAR   Specific Gravity, Urine 1.028 1.005 - 1.030   pH 6.0 5.0 - 8.0   Glucose, UA NEGATIVE NEGATIVE mg/dL   Hgb urine dipstick NEGATIVE NEGATIVE   Bilirubin Urine NEGATIVE NEGATIVE   Ketones, ur NEGATIVE NEGATIVE mg/dL   Protein, ur NEGATIVE NEGATIVE mg/dL    Nitrite NEGATIVE NEGATIVE   Leukocytes,Ua NEGATIVE NEGATIVE  Results for orders placed or performed during the hospital encounter of 11/22/21 (from the past 168 hour(s))  Type and screen [facility]   Collection Time: 12/02/21  4:54 AM  Result Value Ref Range   ABO/RH(D) O POS    Antibody Screen NEG    Sample Expiration      12/05/2021,2359 Performed at [facility], [address] [place], [place] [address]   Culture, beta strep (group b only)   Collection Time: 12/02/21 11:13 AM   Specimen: Vaginal/Rectal; Genital  Result Value Ref Range   Specimen Description VAGINAL/RECTAL    Special Requests NONE    Culture      NO GROUP B STREP (S.AGALACTIAE) ISOLATED Performed at [facility], [address] [place], [place] [address]    Report Status 12/04/2021 FINAL   Type and screen [facility]   Collection Time: 12/04/21  9:33 PM  Result Value Ref Range   ABO/RH(D) O POS    Antibody Screen NEG    Sample Expiration      12/07/2021,2359 Performed at [facility], [address] [place], [place] [address]     Discharge Condition: Stable  Disposition:  There are no questions and answers to display.          Allergies  as of 12/05/2021   No Known Allergies      Medication List     ASK your doctor about these medications    acetaminophen 500 MG tablet Commonly known as: TYLENOL Take 1,000 mg by mouth every 6 (six) hours as needed for moderate pain.   albuterol 108 (90 Base) MCG/ACT inhaler Commonly known as: VENTOLIN HFA Inhale 1-2 puffs into the lungs every 6 (six) hours as needed for wheezing or shortness of breath.   aspirin EC 81 MG tablet Take 1 tablet (81 mg total) by mouth daily. Take after 12 weeks for prevention of preeclampsia later in pregnancy   PRENATAL VITAMIN PO Take by mouth daily.   trimethoprim-polymyxin b ophthalmic solution Commonly known as: Polytrim Place 1 drop into the left eye  every 4 (four) hours.         Signed: Hermina Staggers M.D. 12/07/2021, 5:49 PM

## 2021-12-07 NOTE — MAU Provider Note (Signed)
Event Date/Time   First Provider Initiated Contact with Patient 12/06/21 2327      S Ms. Carol Hendrix is a 21 y.o. G22P2002 pregnant female at [redacted]w[redacted]d who presents to MAU today with complaint of increased white vaginal discharge consistent with previous yeast infections - although more watery due to her leakage of amniotic fluid (pt has known PPROM). Noticed this two days ago, has progressed to some vulvar irritation as well as pain with urination. Reports she asked about this repeatedly while admitted to [facility] but was never offered a pelvic exam or wet prep to confirm. Denies uterine cramping, vaginal bleeding or contractions. No urgency/frequency or flank pain, denies fever. No other physical complaints.  Receives care at [facility]. Prenatal records reviewed. Was treated for oral thrush with Nystatin (but was instructed to swish and spit). Signed out of OBSC AMA on 8/13 because of childcare concerns. Knows the risks associated with outpatient monitoring of PPROM as well as the s/sx of infection. Endorses continued childcare issues preventing continuous admission until delivery.  Pertinent items noted in HPI and remainder of comprehensive ROS otherwise negative.   O BP 113/67 (BP Location: Right Arm)   Pulse (!) 104   Temp 98.7 F (37.1 C) (Oral)   Resp 18   Ht 5\' 5"  (1.651 m)   Wt 158 lb 1.6 oz (71.7 kg)   LMP 05/15/2021   BMI 26.31 kg/m  Physical Exam Vitals and nursing note reviewed.  Constitutional:      General: She is not in acute distress.    Appearance: Normal appearance. She is not ill-appearing.  HENT:     Head: Normocephalic and atraumatic.  Eyes:     Pupils: Pupils are equal, round, and reactive to light.  Cardiovascular:     Rate and Rhythm: Normal rate and regular rhythm.  Genitourinary:    Vagina: Vaginal discharge present.     Comments: White vaginal discharge noted on pelvic exam as well as skin irritation of vulva/urethral area.  Cervical exam deferred - pt  denied cramping/pain Musculoskeletal:        General: Normal range of motion.  Skin:    General: Skin is warm and dry.     Capillary Refill: Capillary refill takes less than 2 seconds.  Neurological:     Mental Status: She is alert and oriented to person, place, and time.  Psychiatric:        Mood and Affect: Mood normal.        Behavior: Behavior normal.        Thought Content: Thought content normal.        Judgment: Judgment normal.   Fetal Tracing: reactive Baseline: 140/145 Variability: minimal but appropriate for gestational age Accelerations: 10x10 Decelerations: none Toco: some UI, pt denies cramping   MDM/MAU Course: Wet prep collected but negative, possibly due to amniotic fluid. Pelvic exam indicative of yeast vaginitis, especially with positive history of oral thrush. Will prescribe diflucan (terconazole vaginal cream not indicated with known PPROM).   Offered pt read[facility] to [facility], she declined due to continued childcare constraints. Has GTT and office f/u scheduled and promises to return if she notices and s/sx of infection or preterm labor. Hoping to make it to [redacted] weeks gestation.  A Yeast vaginitis Premature prelabor rupture of membranes [redacted] weeks gestation of pregnancy Di/di twin pregnancy  P Discharge from MAU in stable condition with preterm labor/chorioamnionitis  precautions Follow up at [facility] as scheduled for ongoing prenatal care  Allergies as of 12/07/2021   No Known Allergies      Medication List     TAKE these medications    acetaminophen 500 MG tablet Commonly known as: TYLENOL Take 1,000 mg by mouth every 6 (six) hours as needed for moderate pain.   albuterol 108 (90 Base) MCG/ACT inhaler Commonly known as: VENTOLIN HFA Inhale 1-2 puffs into the lungs every 6 (six) hours as needed for wheezing or shortness of breath.   aspirin EC 81 MG tablet Take 1 tablet (81 mg total) by mouth daily. Take after 12 weeks for  prevention of preeclampsia later in pregnancy   fluconazole 150 MG tablet Commonly known as: Diflucan Take 1 tablet (150 mg total) by mouth daily.   PRENATAL VITAMIN PO Take by mouth daily.   trimethoprim-polymyxin b ophthalmic solution Commonly known as: Polytrim Place 1 drop into the left eye every 4 (four) hours.       Bernerd Limbo, [place] 12/07/2021 4:17 PM

## 2021-12-08 ENCOUNTER — Ambulatory Visit (INDEPENDENT_AMBULATORY_CARE_PROVIDER_SITE_OTHER): Payer: Medicaid Other | Admitting: Obstetrics and Gynecology

## 2021-12-08 ENCOUNTER — Other Ambulatory Visit: Payer: Medicaid Other

## 2021-12-08 ENCOUNTER — Encounter: Payer: Self-pay | Admitting: Obstetrics and Gynecology

## 2021-12-08 VITALS — BP 109/71 | HR 86 | Wt 155.8 lb

## 2021-12-08 DIAGNOSIS — A749 Chlamydial infection, unspecified: Secondary | ICD-10-CM

## 2021-12-08 DIAGNOSIS — O0993 Supervision of high risk pregnancy, unspecified, third trimester: Secondary | ICD-10-CM | POA: Diagnosis not present

## 2021-12-08 DIAGNOSIS — O30042 Twin pregnancy, dichorionic/diamniotic, second trimester: Secondary | ICD-10-CM

## 2021-12-08 DIAGNOSIS — O42113 Preterm premature rupture of membranes, onset of labor more than 24 hours following rupture, third trimester: Secondary | ICD-10-CM

## 2021-12-08 DIAGNOSIS — Z23 Encounter for immunization: Secondary | ICD-10-CM | POA: Diagnosis not present

## 2021-12-08 DIAGNOSIS — O42112 Preterm premature rupture of membranes, onset of labor more than 24 hours following rupture, second trimester: Secondary | ICD-10-CM

## 2021-12-08 DIAGNOSIS — Z3A29 29 weeks gestation of pregnancy: Secondary | ICD-10-CM

## 2021-12-08 DIAGNOSIS — Z3009 Encounter for other general counseling and advice on contraception: Secondary | ICD-10-CM

## 2021-12-08 DIAGNOSIS — O30043 Twin pregnancy, dichorionic/diamniotic, third trimester: Secondary | ICD-10-CM

## 2021-12-08 LAB — CULTURE, OB URINE: Culture: NO GROWTH

## 2021-12-08 NOTE — Patient Instructions (Signed)

## 2021-12-08 NOTE — Progress Notes (Signed)
Subjective:  Carol Hendrix is a 21 y.o. G3P2002 at [redacted]w[redacted]d being seen today for ongoing prenatal care.  She is currently monitored for the following issues for this high-risk pregnancy and has Moderate persistent asthma; Tobacco use disorder; Supervision of high-risk pregnancy; Twin pregnancy, twins dichorionic and diamniotic; Syncope; Unwanted fertility; and Preterm premature rupture of membranes (PPROM) in second trimester, antepartum on their problem list.  Patient reports general discomforts of pregnancy. Denies VB, ut ctx or LOF, No fever or chills.  Contractions: Not present. Vag. Bleeding: None.  Movement: Present. Denies leaking of fluid.   The following portions of the patient's history were reviewed and updated as appropriate: allergies, current medications, past family history, past medical history, past social history, past surgical history and problem list. Problem list updated.  Objective:   Vitals:   12/08/21 0831  BP: 109/71  Pulse: 86  Weight: 155 lb 12.8 oz (70.7 kg)    Fetal Status: Fetal Heart Rate (bpm): A:145, B:135   Movement: Present     General:  Alert, oriented and cooperative. Patient is in no acute distress.  Skin: Skin is warm and dry. No rash noted.   Cardiovascular: Normal heart rate noted  Respiratory: Normal respiratory effort, no problems with respiration noted  Abdomen: Soft, gravid, appropriate for gestational age. Pain/Pressure: Absent     Pelvic:  Cervical exam deferred        Extremities: Normal range of motion.  Edema: None  Mental Status: Normal mood and affect. Normal behavior. Normal judgment and thought content.   Urinalysis:      Assessment and Plan:  Pregnancy: G3P2002 at [redacted]w[redacted]d  1. Supervision of high risk pregnancy in third trimester Stable - Glucose Tolerance, 2 Hours w/1 Hour - CBC - HIV Antibody (routine testing w rflx) - RPR - Tdap vaccine greater than or equal to 7yo IM  2. Chlamydia Resolved  3. Dichorionic diamniotic twin  pregnancy in second trimester Stable Serial growth scans and antenatal testing as per MFM - [place] MFM OB LIMITED; Future  4. Preterm premature rupture of membranes (PPROM) in second trimester, antepartum Out pt management due to child care issues S/P Antibiotics and BMZ. Pt has been counseled extensively on risks of out pt management and S/Sx of PTL and indications to present to [facility] - [place] MFM OB LIMITED; Future  5. Unwanted fertility BTL papers signed today  Preterm labor symptoms and general obstetric precautions including but not limited to vaginal bleeding, contractions, leaking of fluid and fetal movement were reviewed in detail with the patient. Please refer to After Visit Summary for other counseling recommendations.  Return in about 2 weeks (around 12/22/2021) for OB visit, face to face, MD only.   Hermina Staggers, MD

## 2021-12-08 NOTE — Progress Notes (Signed)
Patient presents for ROB and GTT. TDAP vaccine given. No other concerns.

## 2021-12-09 LAB — GLUCOSE TOLERANCE, 2 HOURS W/ 1HR
Glucose, 1 hour: 73 mg/dL (ref 70–179)
Glucose, 2 hour: 74 mg/dL (ref 70–152)
Glucose, Fasting: 67 mg/dL — ABNORMAL LOW (ref 70–91)

## 2021-12-09 LAB — CBC
Hematocrit: 28.8 % — ABNORMAL LOW (ref 34.0–46.6)
Hemoglobin: 9 g/dL — ABNORMAL LOW (ref 11.1–15.9)
MCH: 24.5 pg — ABNORMAL LOW (ref 26.6–33.0)
MCHC: 31.3 g/dL — ABNORMAL LOW (ref 31.5–35.7)
MCV: 79 fL (ref 79–97)
Platelets: 179 10*3/uL (ref 150–450)
RBC: 3.67 x10E6/uL — ABNORMAL LOW (ref 3.77–5.28)
RDW: 17.8 % — ABNORMAL HIGH (ref 11.7–15.4)
WBC: 9.6 10*3/uL (ref 3.4–10.8)

## 2021-12-09 LAB — HIV ANTIBODY (ROUTINE TESTING W REFLEX): HIV Screen 4th Generation wRfx: NONREACTIVE

## 2021-12-09 LAB — RPR: RPR Ser Ql: NONREACTIVE

## 2021-12-13 ENCOUNTER — Other Ambulatory Visit: Payer: Self-pay | Admitting: *Deleted

## 2021-12-13 ENCOUNTER — Ambulatory Visit (HOSPITAL_BASED_OUTPATIENT_CLINIC_OR_DEPARTMENT_OTHER): Payer: Medicaid Other

## 2021-12-13 ENCOUNTER — Ambulatory Visit: Payer: Medicaid Other | Attending: Obstetrics and Gynecology | Admitting: *Deleted

## 2021-12-13 ENCOUNTER — Other Ambulatory Visit: Payer: Self-pay | Admitting: Obstetrics and Gynecology

## 2021-12-13 ENCOUNTER — Ambulatory Visit: Payer: Medicaid Other | Attending: Maternal & Fetal Medicine | Admitting: Maternal & Fetal Medicine

## 2021-12-13 VITALS — BP 109/62 | HR 88

## 2021-12-13 DIAGNOSIS — O99323 Drug use complicating pregnancy, third trimester: Secondary | ICD-10-CM | POA: Diagnosis not present

## 2021-12-13 DIAGNOSIS — O42913 Preterm premature rupture of membranes, unspecified as to length of time between rupture and onset of labor, third trimester: Secondary | ICD-10-CM | POA: Insufficient documentation

## 2021-12-13 DIAGNOSIS — J45909 Unspecified asthma, uncomplicated: Secondary | ICD-10-CM | POA: Insufficient documentation

## 2021-12-13 DIAGNOSIS — F1290 Cannabis use, unspecified, uncomplicated: Secondary | ICD-10-CM | POA: Diagnosis not present

## 2021-12-13 DIAGNOSIS — O30043 Twin pregnancy, dichorionic/diamniotic, third trimester: Secondary | ICD-10-CM

## 2021-12-13 DIAGNOSIS — Z3A30 30 weeks gestation of pregnancy: Secondary | ICD-10-CM

## 2021-12-13 DIAGNOSIS — O30042 Twin pregnancy, dichorionic/diamniotic, second trimester: Secondary | ICD-10-CM

## 2021-12-13 DIAGNOSIS — O42112 Preterm premature rupture of membranes, onset of labor more than 24 hours following rupture, second trimester: Secondary | ICD-10-CM

## 2021-12-13 DIAGNOSIS — O99513 Diseases of the respiratory system complicating pregnancy, third trimester: Secondary | ICD-10-CM | POA: Insufficient documentation

## 2021-12-13 DIAGNOSIS — Z87891 Personal history of nicotine dependence: Secondary | ICD-10-CM

## 2021-12-13 DIAGNOSIS — Z3A38 38 weeks gestation of pregnancy: Secondary | ICD-10-CM | POA: Insufficient documentation

## 2021-12-13 DIAGNOSIS — O42912 Preterm premature rupture of membranes, unspecified as to length of time between rupture and onset of labor, second trimester: Secondary | ICD-10-CM

## 2021-12-13 DIAGNOSIS — O42919 Preterm premature rupture of membranes, unspecified as to length of time between rupture and onset of labor, unspecified trimester: Secondary | ICD-10-CM

## 2021-12-13 NOTE — Progress Notes (Signed)
MFM Consult Note Patient Name: Carol Hendrix  Patient MRN:   [id number]  Referring provider: [facility]  Reason for Consult: PPROM at 27w2 in di-di pregnancy, likely resealed membranes.    HPI: Carol Hendrix is a 21 y.o. G3P2002 at [redacted]w[redacted]d with di-di twins. At the time of diagnosis of PPROM of twin A ([redacted]w[redacted]d, 11/22/21) she reported a history of continual large gushes of fluid with a positive AmniSure.  At this time the amniotic fluid volume was normal. She was hospitalized and received latency antibiotics, steroids and magnesium. She had reassuring maternal and fetal status while admitted without signs of infection or labor. She eventually request to be discharged (12/05/21) due to childcare issues.  She is here now in outpatient management for a limited ultrasound to assess the amniotic fluid volume as well as the interval fetal anatomy.  Today she has no complaints and denies fevers, chills, chest pain, abdominal tenderness, loss of fluid, abnormal discharge or vaginal bleeding.  Review of Systems: A review of systems was performed and was negative except per HPI   Vitals Blood pressure: 109/62, Pulse: 88, Prepregnancy BMI: 22.6 Genetic testing: Per patient she had normal aneuploidy screening  Assessment 1.  PPROM at 27 weeks 2 days 2.  Mercie Eon twin pregnancy  Counseling I discussed the diagnosis and management of PPROM and a twin pregnancy.  I discussed that this condition is typically managed in the hospital to maximize maternal outcomes and reduce maternal and neonatal morbidity.  I discussed that labor and infection are often coincident and can occur suddenly.  She verbalized understanding and stated that she requested discharge from the hospital due to childcare issues.  She also reports that she feels fine and has no symptoms of infection.  She denies fever, chills, abdominal pain, vaginal bleeding or continued loss of fluid.  She verbalized that she wants to be delivered closer to term as long as she  has no signs of infection.  I discussed that the guidelines for a singleton with PPROM do allow for delivery up to 36 weeks in the presence of reassuring fetal status and absence of maternal signs of infection or other obstetric indications in appropriately selected patients.  We discussed that this typically involves inpatient management with daily tracings of the fetal heart rate and weekly fluid checks.  She is aware that outpatient management does not allow for the ability to perform immediate delivery in the case of fetal compromise or sudden onset of labor and this may increase the neonatal morbidity and mortality.  We also discussed that we cannot monitor her for signs of infection which can increase maternal morbidity and mortality. She verbalized again that this is not possible or desired due to childcare issues and the fact that she feels well and the fetus has normal fluid.  I also discussed the possibility of a false positive AmniSure or the fact that the membrane of twin A have resealed.  In this case she could be treated as a typical di/di pregnancy with delivery at 38 weeks.    Plan  1.  Continue weekly limited ultrasounds to check for fluid volumes for twin A and twin B until 32 weeks 2.  Start weekly BPP at 32 weeks until delivery 3.  Per the patients request, continue outpatient management. I recommend at least weekly vital sign assessment at the referring provider's office to assess for signs of maternal infection.  I also counseled the patient about signs of infection and she will perform  at home temperature assessment if she feels febrile and will notify her referring provider if she has any concerns. 4. Serial growth ultrasounds every 4 weeks until delivery 5. Delivery timing: Given the absence of signs of infection and the possibility of membrane resealing and the patient's desire to avoid neonatal morbidity due to prematurity, the patient request to be delivered closer to 36 to [redacted]  weeks gestation, which is reasonable.   Please do not hesitate to contact MFM with any questions or concerns.  I spent 20 minutes reviewing the patients chart, including labs and images as well as counseling the patient about her medical conditions.  Braxton Feathers  MFM, [facility]   12/13/2021  11:19 AM

## 2021-12-15 ENCOUNTER — Ambulatory Visit: Payer: Medicaid Other

## 2021-12-19 ENCOUNTER — Inpatient Hospital Stay (HOSPITAL_COMMUNITY)
Admission: AD | Admit: 2021-12-19 | Discharge: 2021-12-19 | Disposition: A | Discharge status: Home / Self Care | Payer: Medicaid Other | Attending: Obstetrics & Gynecology | Admitting: Obstetrics & Gynecology

## 2021-12-19 ENCOUNTER — Encounter (HOSPITAL_COMMUNITY): Payer: Self-pay | Admitting: Obstetrics & Gynecology

## 2021-12-19 DIAGNOSIS — D649 Anemia, unspecified: Secondary | ICD-10-CM | POA: Insufficient documentation

## 2021-12-19 DIAGNOSIS — Z3689 Encounter for other specified antenatal screening: Secondary | ICD-10-CM

## 2021-12-19 DIAGNOSIS — O99013 Anemia complicating pregnancy, third trimester: Secondary | ICD-10-CM | POA: Diagnosis not present

## 2021-12-19 DIAGNOSIS — O30043 Twin pregnancy, dichorionic/diamniotic, third trimester: Secondary | ICD-10-CM | POA: Diagnosis not present

## 2021-12-19 DIAGNOSIS — Z3A31 31 weeks gestation of pregnancy: Secondary | ICD-10-CM | POA: Diagnosis not present

## 2021-12-19 DIAGNOSIS — O26893 Other specified pregnancy related conditions, third trimester: Secondary | ICD-10-CM | POA: Diagnosis not present

## 2021-12-19 DIAGNOSIS — R102 Pelvic and perineal pain: Secondary | ICD-10-CM | POA: Diagnosis not present

## 2021-12-19 LAB — URINALYSIS, ROUTINE W REFLEX MICROSCOPIC
Bilirubin Urine: NEGATIVE
Glucose, UA: NEGATIVE mg/dL
Hgb urine dipstick: NEGATIVE
Ketones, ur: NEGATIVE mg/dL
Leukocytes,Ua: NEGATIVE
Nitrite: NEGATIVE
Protein, ur: NEGATIVE mg/dL
Specific Gravity, Urine: 1.013 (ref 1.005–1.030)
pH: 6 (ref 5.0–8.0)

## 2021-12-19 MED ORDER — SODIUM CHLORIDE 0.9 % IV SOLN
510.0000 mg | Freq: Once | INTRAVENOUS | Status: AC
  Administered 2021-12-19: 510 mg via INTRAVENOUS
  Filled 2021-12-19: qty 17

## 2021-12-19 MED ORDER — CYCLOBENZAPRINE HCL 5 MG PO TABS
10.0000 mg | ORAL_TABLET | Freq: Once | ORAL | Status: AC
Start: 2021-12-19 — End: 2021-12-19
  Administered 2021-12-19: 10 mg via ORAL
  Filled 2021-12-19: qty 2

## 2021-12-19 MED ORDER — ACETAMINOPHEN 500 MG PO TABS
1000.0000 mg | ORAL_TABLET | Freq: Once | ORAL | Status: AC
  Administered 2021-12-19: 1000 mg via ORAL
  Filled 2021-12-19: qty 2

## 2021-12-19 MED ORDER — FERUMOXYTOL INJECTION 510 MG/17 ML: 510.0000 mg | Freq: Once | INTRAVENOUS | Status: DC

## 2021-12-19 MED ORDER — SODIUM CHLORIDE 0.9 % IV SOLN
Freq: Once | INTRAVENOUS | Status: AC
Start: 2021-12-19 — End: 2021-12-19

## 2021-12-19 MED ORDER — METRONIDAZOLE 500 MG PO TABS: 500.0000 mg | ORAL_TABLET | Freq: Two times a day (BID) | ORAL | 0 refills | Status: DC

## 2021-12-19 NOTE — MAU Note (Signed)
.  Carol Hendrix is a 21 y.o. at 101w1d here in MAU reporting: she was having consistent ctx  between 9Am-12pm today about 5 min apart. Ctx have slowed down about q 20 min now but still feeling increased pelvic pressure. Also reports fetal movement less than usual. Reports some vag discharge but no bleeding or leaking.  LMP:  Onset of complaint: 9am Pain score: 8 Vitals:   12/19/21 1423  BP: 108/66  Pulse: 92     FHT: A-135, B- 145  Lab orders placed from triage: u/a

## 2021-12-19 NOTE — MAU Provider Note (Signed)
History     CSN: [id number]  Arrival date and time: 12/19/21 1357   Event Date/Time   First Provider Initiated Contact with Patient 12/19/21 1432      Chief Complaint  Patient presents with   Pelvic Pain   HPI Carol Hendrix is a 21 y.o. G3P2002 at [redacted]w[redacted]d with di-di twin gestation. Pregnancy is complicated by known PPROM 11/22/2021. Patient presents to MAU with chief complaint of contractions which resolved as she was coming to MAU. Patient states that between 9am and noon today she experienced regular uncomfortable contractions every 5 minutes. Since noon they have improved to every 20 minutes. She endorses ongoing pelvic pressure which is intensifying as her pregnancy progresses.  Patient also reports symptomatic anemia. She states she received a text message from her doctor that she should receive an iron infusion. She would like this performed today during her MAU encounter if possible.  Patient also reports ongoing abnormal vaginal discharge. She states she took the Diflucan for yeast she was prescribed almost two weeks ago but it did not help her discharge.  Patient receives care with [facility].   OB History     Gravida  3   Para  2   Term  2   Preterm  0   AB  0   Living  2      SAB  0   IAB  0   Ectopic  0   Multiple  0   Live Births  2           Past Medical History:  Diagnosis Date   Anemia    Asthma     Past Surgical History:  Procedure Laterality Date   NO PAST SURGERIES      Family History  Problem Relation Age of Onset   Healthy Mother    Healthy Father    Pulmonary fibrosis Maternal Grandmother     Social History   Tobacco Use   Smoking status: Former    Packs/day: 0.25    Types: Cigarettes   Smokeless tobacco: Never   Tobacco comments:    rolls marijuana in with tobacco  Vaping Use   Vaping Use: Never used  Substance Use Topics   Alcohol use: Not Currently   Drug use: Not Currently    Types: Marijuana    Comment: last  smoked marijuana one year ago as of 07/16/2021    Allergies: No Known Allergies  Medications Prior to Admission  Medication Sig Dispense Refill Last Dose   aspirin EC 81 MG tablet Take 1 tablet (81 mg total) by mouth daily. Take after 12 weeks for prevention of preeclampsia later in pregnancy 300 tablet 2 12/19/2021   Prenatal Vit-Fe Fumarate-FA (PRENATAL VITAMIN PO) Take by mouth daily.   12/19/2021   acetaminophen (TYLENOL) 500 MG tablet Take 1,000 mg by mouth every 6 (six) hours as needed for moderate pain.      albuterol (VENTOLIN HFA) 108 (90 Base) MCG/ACT inhaler Inhale 1-2 puffs into the lungs every 6 (six) hours as needed for wheezing or shortness of breath. 8 g 0     Review of Systems  Gastrointestinal:  Positive for abdominal pain.  Genitourinary:  Positive for vaginal discharge.  All other systems reviewed and are negative.  Physical Exam   Blood pressure 113/87, pulse 83, temperature 98.5 F (36.9 C), resp. rate 18, last menstrual period 05/15/2021, SpO2 100 %.  Physical Exam Vitals and nursing note reviewed. Exam conducted with a chaperone present.  Constitutional:      Appearance: Normal appearance. She is not ill-appearing.  Cardiovascular:     Rate and Rhythm: Normal rate and regular rhythm.     Pulses: Normal pulses.     Heart sounds: Normal heart sounds.  Pulmonary:     Effort: Pulmonary effort is normal.     Breath sounds: Normal breath sounds.  Abdominal:     Comments: Gravid  Genitourinary:    Comments: Pelvic exam: External genitalia normal, vaginal walls pink and well rugated, cervix visually closed, no lesions noted. Moderate volume thin white vaginal discharge with slightly foul odor, suspicious for BV   Skin:    Capillary Refill: Capillary refill takes less than 2 seconds.  Neurological:     Mental Status: She is alert and oriented to person, place, and time.  Psychiatric:        Mood and Affect: Mood normal.        Behavior: Behavior normal.         Thought Content: Thought content normal.        Judgment: Judgment normal.     MAU Course  Procedures  MDM --Reactive tracing Baby A: baseline 125, mod var, + 10 x 10 accels, no decels --Reactive tracing Baby B: baseline 140, mod var, + 10 x 10 accels, no decels --Toco: Rare contraction --Patient agreeable to PO pain medication while observing for return of contractions --Discussed ability to given IV Iron in MAU. Discussed Feraheme vs Venofer, infusion time vs side effect profile. Patient requests Feraheme infusion  Patient Vitals for the past 24 hrs:  BP Temp Pulse Resp SpO2  12/19/21 1545 113/87 -- -- -- 100 %  12/19/21 1542 (!) 100/49 -- 83 -- --  12/19/21 1423 108/66 98.5 F (36.9 C) 92 18 --   Results for orders placed or performed during the hospital encounter of 12/19/21 (from the past 24 hour(s))  Urinalysis, Routine w reflex microscopic Urine, Clean Catch     Status: Abnormal   Collection Time: 12/19/21  2:57 PM  Result Value Ref Range   Color, Urine YELLOW YELLOW   APPearance CLOUDY (A) CLEAR   Specific Gravity, Urine 1.013 1.005 - 1.030   pH 6.0 5.0 - 8.0   Glucose, UA NEGATIVE NEGATIVE mg/dL   Hgb urine dipstick NEGATIVE NEGATIVE   Bilirubin Urine NEGATIVE NEGATIVE   Ketones, ur NEGATIVE NEGATIVE mg/dL   Protein, ur NEGATIVE NEGATIVE mg/dL   Nitrite NEGATIVE NEGATIVE   Leukocytes,Ua NEGATIVE NEGATIVE   Meds ordered this encounter  Medications   0.9 %  sodium chloride infusion   acetaminophen (TYLENOL) tablet 1,000 mg   cyclobenzaprine (FLEXERIL) tablet 10 mg   DISCONTD: ferumoxytol (FERAHEME) 510 mg in sodium chloride 0.9 % 100 mL IVPB   ferumoxytol (FERAHEME) 510 mg in sodium chloride 0.9 % 100 mL IVPB    Patient previously advised by [facility] to schedule outpatient iron infusion for Hgb 9.0. Patient with new onset dizziness and activity intolerance, requests IV iron during MAU encounter please   metroNIDAZOLE (FLAGYL) 500 MG tablet    Sig: Take 1  tablet (500 mg total) by mouth 2 (two) times daily.    Dispense:  14 tablet    Refill:  0    Order Specific Question:   Supervising Provider    Answer:   Myna Hidalgo [[id number]]   Assessment and Plan  --21 y.o. [id number] with di.di twins at [redacted]w[redacted]d  --S/p [facility]SC admission and departure via AMA for PPROM 07/31-08/13 --Reactive tracing  x 2 --No recurrence of contractions --S/p IV Feraheme as indicated by Hgb 9.0 on 12/08/2021. Tolerated well by patient --Presumptive treatment for Bacterial Vaginosis --Discharge home in stable condition with strict return precautions  F/U: Next MFM appt is 08/29 Next HROB appt is 08/31  Calvert Cantor, MSN, MSN, CNM 12/19/2021, 5:05 PM

## 2021-12-21 ENCOUNTER — Ambulatory Visit: Payer: Medicaid Other | Attending: Maternal & Fetal Medicine

## 2021-12-21 ENCOUNTER — Ambulatory Visit: Payer: Medicaid Other | Admitting: *Deleted

## 2021-12-21 ENCOUNTER — Encounter: Payer: Self-pay | Admitting: *Deleted

## 2021-12-21 DIAGNOSIS — O99513 Diseases of the respiratory system complicating pregnancy, third trimester: Secondary | ICD-10-CM | POA: Diagnosis not present

## 2021-12-21 DIAGNOSIS — Z87891 Personal history of nicotine dependence: Secondary | ICD-10-CM | POA: Diagnosis not present

## 2021-12-21 DIAGNOSIS — O30043 Twin pregnancy, dichorionic/diamniotic, third trimester: Secondary | ICD-10-CM | POA: Diagnosis not present

## 2021-12-21 DIAGNOSIS — J45909 Unspecified asthma, uncomplicated: Secondary | ICD-10-CM

## 2021-12-21 DIAGNOSIS — O42919 Preterm premature rupture of membranes, unspecified as to length of time between rupture and onset of labor, unspecified trimester: Secondary | ICD-10-CM | POA: Diagnosis not present

## 2021-12-21 DIAGNOSIS — O42913 Preterm premature rupture of membranes, unspecified as to length of time between rupture and onset of labor, third trimester: Secondary | ICD-10-CM

## 2021-12-21 DIAGNOSIS — Z3A31 31 weeks gestation of pregnancy: Secondary | ICD-10-CM

## 2021-12-23 ENCOUNTER — Ambulatory Visit (INDEPENDENT_AMBULATORY_CARE_PROVIDER_SITE_OTHER): Payer: Medicaid Other | Admitting: Obstetrics and Gynecology

## 2021-12-23 ENCOUNTER — Encounter: Payer: Self-pay | Admitting: Obstetrics and Gynecology

## 2021-12-23 VITALS — BP 105/69 | HR 105 | Temp 99.8°F

## 2021-12-23 DIAGNOSIS — O30043 Twin pregnancy, dichorionic/diamniotic, third trimester: Secondary | ICD-10-CM

## 2021-12-23 DIAGNOSIS — O99013 Anemia complicating pregnancy, third trimester: Secondary | ICD-10-CM

## 2021-12-23 DIAGNOSIS — Z3A31 31 weeks gestation of pregnancy: Secondary | ICD-10-CM | POA: Diagnosis not present

## 2021-12-23 DIAGNOSIS — O42112 Preterm premature rupture of membranes, onset of labor more than 24 hours following rupture, second trimester: Secondary | ICD-10-CM | POA: Diagnosis not present

## 2021-12-23 DIAGNOSIS — O0993 Supervision of high risk pregnancy, unspecified, third trimester: Secondary | ICD-10-CM

## 2021-12-23 DIAGNOSIS — Z3009 Encounter for other general counseling and advice on contraception: Secondary | ICD-10-CM

## 2021-12-23 MED ORDER — BETAMETHASONE SOD PHOS & ACET 6 (3-3) MG/ML IJ SUSP
12.0000 mg | Freq: Once | INTRAMUSCULAR | Status: AC
  Administered 2021-12-23: 12 mg via INTRAMUSCULAR

## 2021-12-23 NOTE — Patient Instructions (Signed)

## 2021-12-23 NOTE — Progress Notes (Signed)
Subjective:  Carol Hendrix is a 21 y.o. G3P2002 at [redacted]w[redacted]d being seen today for ongoing prenatal care.  She is currently monitored for the following issues for this high-risk pregnancy and has Moderate persistent asthma; Tobacco use disorder; Supervision of high-risk pregnancy; Twin pregnancy, twins dichorionic and diamniotic; Syncope; Unwanted fertility; Preterm premature rupture of membranes (PPROM) in second trimester, antepartum; and Anemia affecting pregnancy in third trimester on their problem list.  Patient reports general discomforts of pregnancy. Denies any fever or chills. Denies any PTL.  Contractions: Not present. Vag. Bleeding: None.  Movement: Present. Denies leaking of fluid.   The following portions of the patient's history were reviewed and updated as appropriate: allergies, current medications, past family history, past medical history, past social history, past surgical history and problem list. Problem list updated.  Objective:   Vitals:   12/23/21 1341  BP: 105/69  Pulse: (!) 105  Temp: 99.8 F (37.7 C)    Fetal Status: Fetal Heart Rate (bpm): A: 144. B 151   Movement: Present     General:  Alert, oriented and cooperative. Patient is in no acute distress.  Skin: Skin is warm and dry. No rash noted.   Cardiovascular: Normal heart rate noted  Respiratory: Normal respiratory effort, no problems with respiration noted  Abdomen: Soft, gravid, appropriate for gestational age. Pain/Pressure: Absent     Pelvic:  Cervical exam deferred        Extremities: Normal range of motion.  Edema: None  Mental Status: Normal mood and affect. Normal behavior. Normal judgment and thought content.   Urinalysis:      Assessment and Plan:  Pregnancy: G3P2002 at [redacted]w[redacted]d  1. Supervision of high risk pregnancy in third trimester Stable No evidence of PTL - CBC; Standing - Type and screen; Standing - RPR; Standing  2. Dichorionic diamniotic twin pregnancy in third  trimester Stable Serial growth and antenatal testing as per MFM Vertex/Breech  3. Preterm premature rupture of membranes (PPROM) in second trimester, antepartum Stable No S/Sx of PTL S/Sx of PTL and infection reviewed with pt Rescue dose of BMZ today and tomorrow IOL scheduled for 34 weeks Reviewed vag delivery with pt. Pt has had successful TSVD x 2. Pelvis proven to 6# 7 oz Last EFW Tw A 1551 gms and Tw B 1598 gms on 12/13/21   4. Anemia affecting pregnancy in third trimester Stable  5. Unwanted fertility BTL papers signed  Preterm labor symptoms and general obstetric precautions including but not limited to vaginal bleeding, contractions, leaking of fluid and fetal movement were reviewed in detail with the patient. Please refer to After Visit Summary for other counseling recommendations.  Return in about 1 week (around 12/30/2021) for OB visit, face to face, MD only.   Hermina Staggers, MD

## 2021-12-23 NOTE — Progress Notes (Signed)
ROB 31.[redacted]wks GA Di/Di Twins Known pPROM with outpatient management due to pt with childcare issues  BMZ in RUO glut per vo Dr. Alysia Penna, repeat dose scheduled 12/24/21 prior to office closure at 12:00N. Given per MFM recommendation and anticipated PTD at 34 wks.

## 2021-12-24 ENCOUNTER — Ambulatory Visit (INDEPENDENT_AMBULATORY_CARE_PROVIDER_SITE_OTHER): Payer: Medicaid Other

## 2021-12-24 DIAGNOSIS — O42112 Preterm premature rupture of membranes, onset of labor more than 24 hours following rupture, second trimester: Secondary | ICD-10-CM

## 2021-12-24 DIAGNOSIS — Z419 Encounter for procedure for purposes other than remedying health state, unspecified: Secondary | ICD-10-CM | POA: Diagnosis not present

## 2021-12-24 MED ORDER — BETAMETHASONE SOD PHOS & ACET 6 (3-3) MG/ML IJ SUSP
12.0000 mg | Freq: Once | INTRAMUSCULAR | Status: AC
  Administered 2021-12-24: 12 mg via INTRAMUSCULAR

## 2021-12-24 NOTE — Progress Notes (Signed)
Patient was assessed and managed by nursing staff during this encounter. I have reviewed the chart and agree with the documentation and plan. I have also made any necessary editorial changes.  Discussed with nurse.  Pt apparently signed out AMA and has been an outpatient with PROM.  She has received two doses of BMZ and will be delivered at 34 weeks   Warden Fillers, MD 12/24/2021 12:07 PM

## 2021-12-24 NOTE — Progress Notes (Cosign Needed)
Patient presents for second betamethasone injection due to premature rupture of membranes. Patient tolerated well.

## 2021-12-27 ENCOUNTER — Inpatient Hospital Stay (HOSPITAL_COMMUNITY)
Admission: AD | Admit: 2021-12-27 | Discharge: 2021-12-28 | DRG: 833 | Discharge status: Against Medical Advice | Payer: Medicaid Other | Attending: Obstetrics and Gynecology | Admitting: Obstetrics and Gynecology

## 2021-12-27 ENCOUNTER — Encounter (HOSPITAL_COMMUNITY): Payer: Self-pay | Admitting: Obstetrics & Gynecology

## 2021-12-27 ENCOUNTER — Other Ambulatory Visit: Payer: Self-pay

## 2021-12-27 DIAGNOSIS — Z20822 Contact with and (suspected) exposure to covid-19: Secondary | ICD-10-CM | POA: Diagnosis present

## 2021-12-27 DIAGNOSIS — Z5329 Procedure and treatment not carried out because of patient's decision for other reasons: Secondary | ICD-10-CM | POA: Diagnosis present

## 2021-12-27 DIAGNOSIS — O99013 Anemia complicating pregnancy, third trimester: Secondary | ICD-10-CM | POA: Diagnosis not present

## 2021-12-27 DIAGNOSIS — Z87891 Personal history of nicotine dependence: Secondary | ICD-10-CM

## 2021-12-27 DIAGNOSIS — Z5321 Procedure and treatment not carried out due to patient leaving prior to being seen by health care provider: Secondary | ICD-10-CM | POA: Diagnosis not present

## 2021-12-27 DIAGNOSIS — O321XX2 Maternal care for breech presentation, fetus 2: Secondary | ICD-10-CM | POA: Diagnosis not present

## 2021-12-27 DIAGNOSIS — J029 Acute pharyngitis, unspecified: Secondary | ICD-10-CM | POA: Diagnosis not present

## 2021-12-27 DIAGNOSIS — O42919 Preterm premature rupture of membranes, unspecified as to length of time between rupture and onset of labor, unspecified trimester: Secondary | ICD-10-CM | POA: Diagnosis present

## 2021-12-27 DIAGNOSIS — Z3A32 32 weeks gestation of pregnancy: Secondary | ICD-10-CM

## 2021-12-27 DIAGNOSIS — O30043 Twin pregnancy, dichorionic/diamniotic, third trimester: Secondary | ICD-10-CM | POA: Diagnosis present

## 2021-12-27 DIAGNOSIS — O321XX0 Maternal care for breech presentation, not applicable or unspecified: Secondary | ICD-10-CM | POA: Diagnosis not present

## 2021-12-27 DIAGNOSIS — O42112 Preterm premature rupture of membranes, onset of labor more than 24 hours following rupture, second trimester: Secondary | ICD-10-CM | POA: Diagnosis not present

## 2021-12-27 DIAGNOSIS — O42913 Preterm premature rupture of membranes, unspecified as to length of time between rupture and onset of labor, third trimester: Principal | ICD-10-CM | POA: Diagnosis present

## 2021-12-27 DIAGNOSIS — J4540 Moderate persistent asthma, uncomplicated: Secondary | ICD-10-CM | POA: Diagnosis present

## 2021-12-27 DIAGNOSIS — O26893 Other specified pregnancy related conditions, third trimester: Secondary | ICD-10-CM | POA: Diagnosis not present

## 2021-12-27 DIAGNOSIS — O99513 Diseases of the respiratory system complicating pregnancy, third trimester: Secondary | ICD-10-CM | POA: Diagnosis not present

## 2021-12-27 DIAGNOSIS — O30049 Twin pregnancy, dichorionic/diamniotic, unspecified trimester: Secondary | ICD-10-CM | POA: Diagnosis present

## 2021-12-27 DIAGNOSIS — Z3009 Encounter for other general counseling and advice on contraception: Secondary | ICD-10-CM | POA: Diagnosis present

## 2021-12-27 HISTORY — DX: Preterm premature rupture of membranes, unspecified as to length of time between rupture and onset of labor, unspecified trimester: O42.919

## 2021-12-27 LAB — CBC
HCT: 29.8 % — ABNORMAL LOW (ref 36.0–46.0)
Hemoglobin: 9.5 g/dL — ABNORMAL LOW (ref 12.0–15.0)
MCH: 26.1 pg (ref 26.0–34.0)
MCHC: 31.9 g/dL (ref 30.0–36.0)
MCV: 81.9 fL (ref 80.0–100.0)
Platelets: 197 10*3/uL (ref 150–400)
RBC: 3.64 MIL/uL — ABNORMAL LOW (ref 3.87–5.11)
RDW: 22.5 % — ABNORMAL HIGH (ref 11.5–15.5)
WBC: 14.4 10*3/uL — ABNORMAL HIGH (ref 4.0–10.5)
nRBC: 0 % (ref 0.0–0.2)

## 2021-12-27 LAB — POCT FERN TEST

## 2021-12-27 MED ORDER — ACETAMINOPHEN 325 MG PO TABS: 650.0000 mg | ORAL_TABLET | ORAL | Status: DC | PRN

## 2021-12-27 MED ORDER — PRENATAL MULTIVITAMIN CH
1.0000 | ORAL_TABLET | Freq: Every day | ORAL | Status: DC
  Administered 2021-12-27 – 2021-12-28 (×2): 1 via ORAL
  Filled 2021-12-27 (×2): qty 1

## 2021-12-27 MED ORDER — CALCIUM CARBONATE ANTACID 500 MG PO CHEW: 2.0000 | CHEWABLE_TABLET | ORAL | Status: DC | PRN

## 2021-12-27 MED ORDER — DOCUSATE SODIUM 100 MG PO CAPS
100.0000 mg | ORAL_CAPSULE | Freq: Every day | ORAL | Status: DC
  Filled 2021-12-27: qty 1

## 2021-12-27 NOTE — MAU Note (Signed)
.  Carol Hendrix is a 21 y.o. at [redacted]w[redacted]d here in MAU reporting: PROM at 0500 with a large clear gush of fluid.  Pt was previously admitted for PPROM with a small leak. PT reports DFM, with a few movements from both babies today.Pt denies VB, recent intercourse, PIH s/s, and other complications. BMZ 7/31 8/1 Onset of complaint: 0500 Pain score: 0/10 Vitals:   12/27/21 0700 12/27/21 0709  BP: 116/67 111/67  Pulse: 78 84  Resp: 16   Temp: 98.8 F (37.1 C)   SpO2: 98%      FHT:128/140 Lab orders placed from triage:  fern test

## 2021-12-27 NOTE — MAU Note (Signed)
Uterus is soft and non tender-Twin A vertex per pt - HR RLQ.    Twin B-LUQ. Pt reports both babies are active.

## 2021-12-27 NOTE — H&P (Signed)
OBSTETRIC ADMISSION HISTORY AND PHYSICAL  Carol Hendrix is a 21 y.o. female G55P2002 with IUP at [redacted]w[redacted]d presenting for gross rupture of membranes. Patient has a di/di twin pregnancy. Had PPROM for twin A at 104w2d, was admitted for latency antibiotics between 7/31 and 8/15; however she left AMA  due to concerns about child care. She is back here today because she experienced gross ROM at about 5am.  She reports +FMs, no VB, no blurry vision, headaches or peripheral edema, and RUQ pain.  She plans on breast and bottle feeding. She request BTL for birth control. She received her prenatal care at  [facility]   Dating: By LMP --->  Estimated Date of Delivery: 02/19/22  Sono:    @[redacted]w[redacted]d , CWD, normal anatomy, Baby A - cephalic presentation, longitudinal lie lie, 1500g, 38% EFW Baby B - breech presentation, EFW 1598g, 47%  Prenatal History/Complications:  Twin gestation  Preterm PROM - completed course of steroids, discharged from hospital AMA due to needing child care.  Past Medical History: Past Medical History:  Diagnosis Date   Anemia    Asthma     Past Surgical History: Past Surgical History:  Procedure Laterality Date   NO PAST SURGERIES      Obstetrical History: OB History     Gravida  3   Para  2   Term  2   Preterm  0   AB  0   Living  2      SAB  0   IAB  0   Ectopic  0   Multiple  0   Live Births  2           Social History Social History   Socioeconomic History   Marital status: Single    Spouse name: Not on file   Number of children: Not on file   Years of education: Not on file   Highest education level: Not on file  Occupational History   Not on file  Tobacco Use   Smoking status: Former    Packs/day: 0.25    Types: Cigarettes   Smokeless tobacco: Never   Tobacco comments:    rolls marijuana in with tobacco  Vaping Use   Vaping Use: Never used  Substance and Sexual Activity   Alcohol use: Not Currently   Drug use: Not  Currently    Types: Marijuana    Comment: last smoked marijuana one year ago as of 07/16/2021   Sexual activity: Yes  Other Topics Concern   Not on file  Social History Narrative   Not on file   Social Determinants of Health   Financial Resource Strain: Not on file  Food Insecurity: Not on file  Transportation Needs: No Transportation Needs (08/06/2021)   PRAPARE - 08/08/2021 (Medical): No    Lack of Transportation (Non-Medical): No  Physical Activity: Not on file  Stress: Not on file  Social Connections: Not on file    Family History: Family History  Problem Relation Age of Onset   Healthy Mother    Healthy Father    Pulmonary fibrosis Maternal Grandmother     Allergies: No Known Allergies  Medications Prior to Admission  Medication Sig Dispense Refill Last Dose   aspirin EC 81 MG tablet Take 1 tablet (81 mg total) by mouth daily. Take after 12 weeks for prevention of preeclampsia later in pregnancy 300 tablet 2 12/26/2021   Prenatal Vit-Fe Fumarate-FA (PRENATAL VITAMIN PO) Take  by mouth daily.   12/26/2021   acetaminophen (TYLENOL) 500 MG tablet Take 1,000 mg by mouth every 6 (six) hours as needed for moderate pain.      albuterol (VENTOLIN HFA) 108 (90 Base) MCG/ACT inhaler Inhale 1-2 puffs into the lungs every 6 (six) hours as needed for wheezing or shortness of breath. 8 g 0    metroNIDAZOLE (FLAGYL) 500 MG tablet Take 1 tablet (500 mg total) by mouth 2 (two) times daily. 14 tablet 0      Review of Systems   All systems reviewed and negative except as stated in HPI  Blood pressure 111/67, pulse 84, temperature 98.8 F (37.1 C), resp. rate 16, last menstrual period 05/15/2021, SpO2 98 %. General appearance: alert, cooperative, and appears stated age Lungs: clear to auscultation bilaterally Heart: regular rate and rhythm Abdomen: soft, non-tender; bowel sounds normal Pelvic: deferred Extremities: Homans sign is negative, no sign of  DVT   Fetal monitoring Twin A -  Baseline 125 bpm, moderate variability, >2 15 X15 accels, no decels  Twin A -  Baseline 130 bpm, moderate variability, >2 15 X15 accels, no decels  Uterine activity- irritability , with uterine contractions every 7-8 mins  Prenatal labs: ABO, Rh: --/--/O POS (08/12 2133) Antibody: NEG (08/12 2133) Rubella: 4.29 (04/19 1052) RPR: Non Reactive (08/16 1027)  HBsAg: Negative (04/19 1052)  HIV: Non Reactive (08/16 1027)  GBS:   due 1 hr Glucola normal Genetic screening  LR Anatomy US normal  Prenatal Transfer Tool  Maternal Diabetes: No Genetic Screening: Normal Maternal Ultrasounds/Referrals: Other: PPROM, di/di twin gestation Fetal Ultrasounds or other Referrals:  None Maternal Substance Abuse:  Yes:  Type: Smoker Significant Maternal Medications:  None Significant Maternal Lab Results:  None Number of Prenatal Visits:greater than 3 verified prenatal visits Other Comments:  None  Results for orders placed or performed during the hospital encounter of 12/27/21 (from the past 24 hour(s))  POCT fern test   Collection Time: 12/27/21  6:23 AM  Result Value Ref Range   POCT Fern Test     Positive      Patient Active Problem List   Diagnosis Date Noted   Anemia affecting pregnancy in third trimester 12/19/2021   Preterm premature rupture of membranes (PPROM) in second trimester, antepartum 11/22/2021   Twin pregnancy, twins dichorionic and diamniotic 08/11/2021   Syncope 08/11/2021   Unwanted fertility 08/11/2021   Supervision of high-risk pregnancy 07/21/2021   Moderate persistent asthma 03/25/2018   Tobacco use disorder 03/25/2018    Assessment/Plan:  Carol Hendrix is a 21 y.o. G3P2002 at [redacted]w[redacted]d here following gross ROM at 5.00am this morning, on a background of PPROM at [redacted] weeks gestation with a slow leak, for which she was admitted for antibiotics, and then left AMA and chose to manage outpatient due to childcare concerns                plan  Admit to [facility]. Has had latency antibiotics and steroids. Last dose of BMX - 09/01. Plan to twice daily NSTs to assess fetal well being Goal is delivery at [redacted] weeks gestation. Discussed this with patient, she expresses understanding of the plan and is agreeable #FWB: Reactive NST for both babies A and B #ID:  GBS negative #MOF: breast and bottle #MOC: BTL -signed consent form 12/08/2021 #Circ:  Yes for baby boy  Discussed plan with Dr Charlotta Newton.  Sheppard Evens MD MPH OB Fellow, Faculty Practice [facility], Center for [facility] 12/27/2021

## 2021-12-28 ENCOUNTER — Inpatient Hospital Stay (HOSPITAL_BASED_OUTPATIENT_CLINIC_OR_DEPARTMENT_OTHER): Payer: Medicaid Other

## 2021-12-28 ENCOUNTER — Ambulatory Visit: Payer: Medicaid Other

## 2021-12-28 DIAGNOSIS — O30043 Twin pregnancy, dichorionic/diamniotic, third trimester: Secondary | ICD-10-CM | POA: Diagnosis not present

## 2021-12-28 DIAGNOSIS — O42913 Preterm premature rupture of membranes, unspecified as to length of time between rupture and onset of labor, third trimester: Secondary | ICD-10-CM

## 2021-12-28 DIAGNOSIS — O42112 Preterm premature rupture of membranes, onset of labor more than 24 hours following rupture, second trimester: Secondary | ICD-10-CM

## 2021-12-28 DIAGNOSIS — Z3A32 32 weeks gestation of pregnancy: Secondary | ICD-10-CM

## 2021-12-28 DIAGNOSIS — O42919 Preterm premature rupture of membranes, unspecified as to length of time between rupture and onset of labor, unspecified trimester: Secondary | ICD-10-CM

## 2021-12-28 LAB — RESP PANEL BY RT-PCR (FLU A&B, COVID) ARPGX2
Influenza A by PCR: NEGATIVE
Influenza B by PCR: NEGATIVE
SARS Coronavirus 2 by RT PCR: NEGATIVE

## 2021-12-28 LAB — GROUP A STREP BY PCR: Group A Strep by PCR: NOT DETECTED

## 2021-12-28 NOTE — Progress Notes (Signed)
FACULTY PRACTICE ANTEPARTUM(COMPREHENSIVE) NOTE  Carol Hendrix is a 21 y.o. G4W1027 with Estimated Date of Delivery: 02/19/22   By  LMP [redacted]w[redacted]d  who is admitted for PPROM with di/di twins.    Fetal presentation is  vertex/breech . Length of Stay:  1  Days  Date of admission:12/27/2021  Subjective: Pt resting comfortably.  She reports sore throat this am, like she is getting strep.  States it is tough to swallow.  Denies fever/chills.  Denies cough or runny nose.  Denies headache.  Denies nausea/vomiting.  No other acute complaints. Patient reports the fetal movement as active. Patient reports uterine contraction  activity as none. Patient reports  vaginal bleeding as none. Patient describes fluid per vagina: minimal clear fluid.  Vitals:  Blood pressure (!) 110/49, pulse 90, temperature 98.5 F (36.9 C), temperature source Oral, resp. rate 16, height 5\' 5"  (1.651 m), weight 73.2 kg, last menstrual period 05/15/2021, SpO2 100 %. Vitals:   12/27/21 0745 12/27/21 1109 12/27/21 1518 12/27/21 1951  BP: 109/66 (!) 110/46 108/64 (!) 110/49  Pulse: 73 85 78 90  Resp: 15 17 17 16   Temp: 98.3 F (36.8 C) 98 F (36.7 C) 98.4 F (36.9 C) 98.5 F (36.9 C)  TempSrc: Oral Oral Axillary Oral  SpO2: 98% 100%    Weight:      Height:       Physical Examination:  General appearance - alert, well appearing, and in no distress Mental status - normal mood, behavior, speech, dress, motor activity, and thought processes Chest - clear to auscultation, no wheezes, rales or rhonchi Heart - normal rate and regular rhythm Abdomen - gravid, soft and non-tender Extremities - no edema, no calf tenderness bilaterally Skin - warm and dry   Fetal Monitoring:  TWIN A: Baseline: 135 bpm, Variability: moderate, Accelerations: +15x15 accels, and Decelerations: Absent    Reactive  TWIN B: 140bpm, moderate variability, +15x15 accels, no decels, Reactive NST  Labs:  Results for orders placed or performed during the  hospital encounter of 12/27/21 (from the past 24 hour(s))  CBC   Collection Time: 12/27/21  7:22 AM  Result Value Ref Range   WBC 14.4 (H) 4.0 - 10.5 K/uL   RBC 3.64 (L) 3.87 - 5.11 MIL/uL   Hemoglobin 9.5 (L) 12.0 - 15.0 g/dL   HCT 02/26/22 (L) 02/26/22 - 25.3 %   MCV 81.9 80.0 - 100.0 fL   MCH 26.1 26.0 - 34.0 pg   MCHC 31.9 30.0 - 36.0 g/dL   RDW 66.4 (H) 40.3 - 47.4 %   Platelets 197 150 - 400 K/uL   nRBC 0.0 0.0 - 0.2 %  Type and screen Carol Hendrix [facility]   Collection Time: 12/27/21  7:30 AM  Result Value Ref Range   ABO/RH(D) O POS    Antibody Screen NEG    Sample Expiration      12/30/2021,2359 Performed at [facility], [address], [place], [address] [place]     Imaging Studies:    No results found.   Medications:  Scheduled  docusate sodium  100 mg Oral Daily   prenatal multivitamin  1 tablet Oral Q1200   I have reviewed the patient's current medications.  ASSESSMENT: Carol Hendrix [redacted]w[redacted]d Estimated Date of Delivery: 02/19/22  Patient Active Problem List   Diagnosis Date Noted   Preterm premature rupture of membranes (PPROM) with unknown onset of labor 12/27/2021   Anemia affecting pregnancy in third trimester 12/19/2021   Preterm premature  rupture of membranes (PPROM) in second trimester, antepartum 11/22/2021   Twin pregnancy, twins dichorionic and diamniotic 08/11/2021   Syncope 08/11/2021   Unwanted fertility 08/11/2021   Supervision of high-risk pregnancy 07/21/2021   Moderate persistent asthma 03/25/2018   Tobacco use disorder 03/25/2018    PLAN: 1) FWB- Cat. 1 x 2 -reassuring, continue NST bid -growth scan scheduled for today  2) PPROM -s/p BMZ rescue dise 8/31-9/1 -s/p latency antibiotics prior admission -no evidence of infection or labor  3) Sore throat -afebrile -strep culture to be obtained  DISP: Continue in-hospital management. Plan for delivery @ 34wk or for maternal/fetal indication  Sharon Seller 12/28/2021,6:29 AM

## 2021-12-28 NOTE — Consult Note (Signed)
Maternal-Fetal Medicine   Name: Carol Hendrix DOB: 0/11/2000 MRN: [id number] Referring Provider: Jaynie Collins, MD  Carol Hendrix, [place] [id number] at 32w 3d gestation with dichorionic-diamniotic twin pregnancy was readmitted yesterday with c/o leakage of amniotic fluid. She was earlier admitted on 11/22/21 and was managed as an inpatient for 15 days. Patient received latency antibiotics and antenatal corticosteroids. She had left AMA because of child-care issues. Subsequently, [facility] and the amniotic fluid in both sacs were normal.  Patient does not have fever or abdominal pain or bleeding.  She does not have gestational diabetes. On today's ultrasound, amniotic fluid was normal in both sacs and antenatal testing was reassuring in both fetuses. P/E: Patient is comfortably lying in bed and not in distress. Vitals: Stable; afebrile. Abdomen: Soft gravid uterus; no tenderness in any of the quadrants. No pedal edema. Labs: WBC 14.4, hemoglobin 9.5, hematocrit 29.8, platelets 197.  Blood type O positive. I counseled the patient on the timing of delivery.  Delayed versus immediate delivery PPROM -About 70% to 80% of patients go into spontaneous labor within a week of expectant management.  -Until recently, we recommended delivery at 34 weeks or at diagnosis after 34 weeks. -Recent trial involving more than 1,800 patients showed benefits and risks of expectant management versus delivery at diagnosis after 34 weeks (1. Morris, JM et al. [place], [id number]; 2. ACOG Practice Bulletin No. 217, Mar 2020).  -Delivery at 34 weeks or at 35 weeks is associated with increased risk of respiratory distress in the newborn, increased ventilation and prolonged NICU stay.   -Expectant management (till and not later than 37 weeks) is associated with increased maternal infection, hemorrhage and decreased likelihood of cesarean delivery.  -No increased rate of neonatal  sepsis is seen in both arms of the study.  -Multiple trials (PPROMEXIL, PPROMEXIL-2 and PPROMT) showed only a small or no neonatal benefit with expectant management. Chorioamnionitis is increased with expectant management.  -Delivery would be advised if maternal fever or if fetal heart trace is not reassuring. -Patient was counseled on the above and that she may choose expectant management or delivery at delivery at 34 weeks' gestation. If delivery is considered at 80 weeks' gestation, repeat antenatal corticosteroids is not indicated. Patient said she is very firm in her choice of delivery at 70 weeks' gestation and does not want expectant management beyond 34 weeks.  Mode of delivery Fetal presentations are vertex/nonvertex. I informed her that vaginal delivery may be considered if her experienced obstetrician recommends this mode of delivery.  Criteria for vaginal delivery of second twin include estimated birthweight greater than 1,500 g or absence of fetal growth restriction and the availability of experienced obstetrician. Breech extraction or assisted breech deliveries are options.  Recommendations -Fetal growth assessment for estimation of fetal weights and BPP next week. -Inpatient management to continue.  -Delivery at 34 weeks' gestation.  Thank you for consultation.  If you have any questions or concerns, please contact me the [facility].  Consultation including face-to-face counseling 35 minutes.

## 2021-12-28 NOTE — Progress Notes (Signed)
Patient has left hospital AMA with belongings and support person

## 2021-12-28 NOTE — Plan of Care (Signed)
Patient leaves ama

## 2021-12-29 ENCOUNTER — Other Ambulatory Visit: Payer: Self-pay

## 2021-12-29 ENCOUNTER — Inpatient Hospital Stay (HOSPITAL_COMMUNITY)
Admission: AD | Admit: 2021-12-29 | Discharge: 2021-12-30 | DRG: 805 | Disposition: A | Discharge status: Home / Self Care | Payer: Medicaid Other | Attending: Obstetrics and Gynecology | Admitting: Obstetrics and Gynecology

## 2021-12-29 ENCOUNTER — Encounter (HOSPITAL_COMMUNITY)
Admission: AD | Disposition: A | Discharge status: Home / Self Care | Payer: Self-pay | Source: Home / Self Care | Attending: Obstetrics and Gynecology

## 2021-12-29 ENCOUNTER — Encounter: Payer: Medicaid Other | Admitting: Obstetrics and Gynecology

## 2021-12-29 ENCOUNTER — Inpatient Hospital Stay (HOSPITAL_COMMUNITY): Payer: Medicaid Other | Admitting: Anesthesiology

## 2021-12-29 ENCOUNTER — Encounter (HOSPITAL_COMMUNITY): Payer: Self-pay | Admitting: Obstetrics & Gynecology

## 2021-12-29 DIAGNOSIS — Z3A32 32 weeks gestation of pregnancy: Secondary | ICD-10-CM | POA: Diagnosis not present

## 2021-12-29 DIAGNOSIS — J4540 Moderate persistent asthma, uncomplicated: Secondary | ICD-10-CM | POA: Diagnosis present

## 2021-12-29 DIAGNOSIS — D649 Anemia, unspecified: Secondary | ICD-10-CM | POA: Diagnosis not present

## 2021-12-29 DIAGNOSIS — O0990 Supervision of high risk pregnancy, unspecified, unspecified trimester: Secondary | ICD-10-CM

## 2021-12-29 DIAGNOSIS — R58 Hemorrhage, not elsewhere classified: Secondary | ICD-10-CM | POA: Diagnosis not present

## 2021-12-29 DIAGNOSIS — O0993 Supervision of high risk pregnancy, unspecified, third trimester: Secondary | ICD-10-CM

## 2021-12-29 DIAGNOSIS — O42113 Preterm premature rupture of membranes, onset of labor more than 24 hours following rupture, third trimester: Secondary | ICD-10-CM | POA: Diagnosis not present

## 2021-12-29 DIAGNOSIS — O321XX2 Maternal care for breech presentation, fetus 2: Secondary | ICD-10-CM | POA: Diagnosis present

## 2021-12-29 DIAGNOSIS — O9952 Diseases of the respiratory system complicating childbirth: Secondary | ICD-10-CM | POA: Diagnosis not present

## 2021-12-29 DIAGNOSIS — R1084 Generalized abdominal pain: Secondary | ICD-10-CM | POA: Diagnosis not present

## 2021-12-29 DIAGNOSIS — O328XX2 Maternal care for other malpresentation of fetus, fetus 2: Secondary | ICD-10-CM | POA: Diagnosis not present

## 2021-12-29 DIAGNOSIS — O30049 Twin pregnancy, dichorionic/diamniotic, unspecified trimester: Secondary | ICD-10-CM | POA: Diagnosis present

## 2021-12-29 DIAGNOSIS — Z20822 Contact with and (suspected) exposure to covid-19: Secondary | ICD-10-CM | POA: Diagnosis present

## 2021-12-29 DIAGNOSIS — O9902 Anemia complicating childbirth: Secondary | ICD-10-CM | POA: Diagnosis not present

## 2021-12-29 DIAGNOSIS — O42919 Preterm premature rupture of membranes, unspecified as to length of time between rupture and onset of labor, unspecified trimester: Secondary | ICD-10-CM | POA: Diagnosis present

## 2021-12-29 DIAGNOSIS — Z743 Need for continuous supervision: Secondary | ICD-10-CM | POA: Diagnosis not present

## 2021-12-29 DIAGNOSIS — O4290 Premature rupture of membranes, unspecified as to length of time between rupture and onset of labor, unspecified weeks of gestation: Principal | ICD-10-CM | POA: Diagnosis present

## 2021-12-29 DIAGNOSIS — O99013 Anemia complicating pregnancy, third trimester: Secondary | ICD-10-CM | POA: Diagnosis present

## 2021-12-29 DIAGNOSIS — Z3A00 Weeks of gestation of pregnancy not specified: Secondary | ICD-10-CM | POA: Diagnosis not present

## 2021-12-29 DIAGNOSIS — Z3009 Encounter for other general counseling and advice on contraception: Secondary | ICD-10-CM | POA: Diagnosis present

## 2021-12-29 DIAGNOSIS — Z87891 Personal history of nicotine dependence: Secondary | ICD-10-CM | POA: Diagnosis not present

## 2021-12-29 DIAGNOSIS — O4202 Full-term premature rupture of membranes, onset of labor within 24 hours of rupture: Secondary | ICD-10-CM | POA: Diagnosis not present

## 2021-12-29 DIAGNOSIS — O2690 Pregnancy related conditions, unspecified, unspecified trimester: Secondary | ICD-10-CM | POA: Diagnosis not present

## 2021-12-29 DIAGNOSIS — O30043 Twin pregnancy, dichorionic/diamniotic, third trimester: Secondary | ICD-10-CM | POA: Diagnosis present

## 2021-12-29 DIAGNOSIS — O42112 Preterm premature rupture of membranes, onset of labor more than 24 hours following rupture, second trimester: Secondary | ICD-10-CM | POA: Diagnosis present

## 2021-12-29 LAB — CBC
HCT: 32 % — ABNORMAL LOW (ref 36.0–46.0)
Hemoglobin: 10.2 g/dL — ABNORMAL LOW (ref 12.0–15.0)
MCH: 25.6 pg — ABNORMAL LOW (ref 26.0–34.0)
MCHC: 31.9 g/dL (ref 30.0–36.0)
MCV: 80.2 fL (ref 80.0–100.0)
Platelets: 187 10*3/uL (ref 150–400)
RBC: 3.99 MIL/uL (ref 3.87–5.11)
RDW: 22.8 % — ABNORMAL HIGH (ref 11.5–15.5)
WBC: 20.9 10*3/uL — ABNORMAL HIGH (ref 4.0–10.5)
nRBC: 0 % (ref 0.0–0.2)

## 2021-12-29 LAB — TYPE AND SCREEN
ABO/RH(D): O POS
ABO/RH(D): O POS
Antibody Screen: NEGATIVE
Antibody Screen: NEGATIVE

## 2021-12-29 LAB — RPR: RPR Ser Ql: NONREACTIVE

## 2021-12-29 SURGERY — LIGATION, FALLOPIAN TUBE, POSTPARTUM
Anesthesia: Choice

## 2021-12-29 MED ORDER — OXYTOCIN-SODIUM CHLORIDE 30-0.9 UT/500ML-% IV SOLN
2.5000 [IU]/h | INTRAVENOUS | Status: DC
  Administered 2021-12-29: 2.5 [IU]/h via INTRAVENOUS
  Filled 2021-12-29: qty 500

## 2021-12-29 MED ORDER — IBUPROFEN 600 MG PO TABS
600.0000 mg | ORAL_TABLET | Freq: Four times a day (QID) | ORAL | Status: DC
  Administered 2021-12-29 – 2021-12-30 (×3): 600 mg via ORAL
  Filled 2021-12-29 (×3): qty 1

## 2021-12-29 MED ORDER — LACTATED RINGERS IV SOLN: 500.0000 mL | INTRAVENOUS | Status: DC | PRN

## 2021-12-29 MED ORDER — PHENYLEPHRINE 80 MCG/ML (10ML) SYRINGE FOR IV PUSH (FOR BLOOD PRESSURE SUPPORT): 80.0000 ug | PREFILLED_SYRINGE | INTRAVENOUS | Status: DC | PRN

## 2021-12-29 MED ORDER — EPHEDRINE 5 MG/ML INJ: 10.0000 mg | INTRAVENOUS | Status: DC | PRN

## 2021-12-29 MED ORDER — FENTANYL-BUPIVACAINE-NACL 0.5-0.125-0.9 MG/250ML-% EP SOLN: 12.0000 mL/h | EPIDURAL | Status: DC | PRN

## 2021-12-29 MED ORDER — ZOLPIDEM TARTRATE 5 MG PO TABS: 5.0000 mg | ORAL_TABLET | Freq: Every evening | ORAL | Status: DC | PRN

## 2021-12-29 MED ORDER — PRENATAL MULTIVITAMIN CH
1.0000 | ORAL_TABLET | Freq: Every day | ORAL | Status: DC
  Administered 2021-12-29: 1 via ORAL
  Filled 2021-12-29: qty 1

## 2021-12-29 MED ORDER — WITCH HAZEL-GLYCERIN EX PADS: 1.0000 | MEDICATED_PAD | CUTANEOUS | Status: DC | PRN

## 2021-12-29 MED ORDER — DIPHENHYDRAMINE HCL 25 MG PO CAPS: 25.0000 mg | ORAL_CAPSULE | Freq: Four times a day (QID) | ORAL | Status: DC | PRN

## 2021-12-29 MED ORDER — FENTANYL-BUPIVACAINE-NACL 0.5-0.125-0.9 MG/250ML-% EP SOLN
EPIDURAL | Status: AC
  Filled 2021-12-29: qty 250

## 2021-12-29 MED ORDER — DIPHENHYDRAMINE HCL 50 MG/ML IJ SOLN: 12.5000 mg | INTRAMUSCULAR | Status: DC | PRN

## 2021-12-29 MED ORDER — FENTANYL CITRATE (PF) 100 MCG/2ML IJ SOLN
100.0000 ug | Freq: Once | INTRAMUSCULAR | Status: AC
  Administered 2021-12-29: 100 ug via INTRAVENOUS
  Filled 2021-12-29: qty 2

## 2021-12-29 MED ORDER — SIMETHICONE 80 MG PO CHEW: 80.0000 mg | CHEWABLE_TABLET | ORAL | Status: DC | PRN

## 2021-12-29 MED ORDER — ONDANSETRON HCL 4 MG/2ML IJ SOLN: 4.0000 mg | Freq: Four times a day (QID) | INTRAMUSCULAR | Status: DC | PRN

## 2021-12-29 MED ORDER — BENZOCAINE-MENTHOL 20-0.5 % EX AERO: 1.0000 | INHALATION_SPRAY | CUTANEOUS | Status: DC | PRN

## 2021-12-29 MED ORDER — OXYTOCIN BOLUS FROM INFUSION
333.0000 mL | Freq: Once | INTRAVENOUS | Status: AC
Start: 2021-12-29 — End: 2021-12-29
  Administered 2021-12-29: 333 mL via INTRAVENOUS

## 2021-12-29 MED ORDER — LACTATED RINGERS IV SOLN: INTRAVENOUS | Status: DC

## 2021-12-29 MED ORDER — ACETAMINOPHEN 325 MG PO TABS: 650.0000 mg | ORAL_TABLET | ORAL | Status: DC | PRN

## 2021-12-29 MED ORDER — ONDANSETRON HCL 4 MG/2ML IJ SOLN: 4.0000 mg | INTRAMUSCULAR | Status: DC | PRN

## 2021-12-29 MED ORDER — LIDOCAINE HCL (PF) 1 % IJ SOLN
30.0000 mL | INTRAMUSCULAR | Status: DC | PRN
Start: 2021-12-29 — End: 2021-12-29

## 2021-12-29 MED ORDER — COCONUT OIL OIL: 1.0000 | TOPICAL_OIL | Status: DC | PRN

## 2021-12-29 MED ORDER — FENTANYL CITRATE (PF) 100 MCG/2ML IJ SOLN: 50.0000 ug | INTRAMUSCULAR | Status: DC | PRN

## 2021-12-29 MED ORDER — DIBUCAINE (PERIANAL) 1 % EX OINT: 1.0000 | TOPICAL_OINTMENT | CUTANEOUS | Status: DC | PRN

## 2021-12-29 MED ORDER — SENNOSIDES-DOCUSATE SODIUM 8.6-50 MG PO TABS: 2.0000 | ORAL_TABLET | ORAL | Status: DC

## 2021-12-29 MED ORDER — SOD CITRATE-CITRIC ACID 500-334 MG/5ML PO SOLN
30.0000 mL | ORAL | Status: DC | PRN
Start: 2021-12-29 — End: 2021-12-29

## 2021-12-29 MED ORDER — LIDOCAINE HCL (PF) 1 % IJ SOLN
INTRAMUSCULAR | Status: DC | PRN
  Administered 2021-12-29: 8 mL via EPIDURAL

## 2021-12-29 MED ORDER — OXYCODONE-ACETAMINOPHEN 5-325 MG PO TABS: 1.0000 | ORAL_TABLET | ORAL | Status: DC | PRN

## 2021-12-29 MED ORDER — ONDANSETRON HCL 4 MG PO TABS: 4.0000 mg | ORAL_TABLET | ORAL | Status: DC | PRN

## 2021-12-29 MED ORDER — FENTANYL-BUPIVACAINE-NACL 0.5-0.125-0.9 MG/250ML-% EP SOLN
12.0000 mL/h | EPIDURAL | Status: DC | PRN
  Administered 2021-12-29: 12 mL/h via EPIDURAL

## 2021-12-29 MED ORDER — TETANUS-DIPHTH-ACELL PERTUSSIS 5-2.5-18.5 LF-MCG/0.5 IM SUSY: 0.5000 mL | PREFILLED_SYRINGE | Freq: Once | INTRAMUSCULAR | Status: DC

## 2021-12-29 MED ORDER — OXYCODONE-ACETAMINOPHEN 5-325 MG PO TABS: 2.0000 | ORAL_TABLET | ORAL | Status: DC | PRN

## 2021-12-29 MED ORDER — LACTATED RINGERS IV SOLN
500.0000 mL | Freq: Once | INTRAVENOUS | Status: AC
  Administered 2021-12-29: 500 mL via INTRAVENOUS

## 2021-12-29 NOTE — Discharge Summary (Signed)
Postpartum Discharge Summary     Patient Name: Carol Hendrix DOB: 01/13/2001 MRN: [id number]  Date of admission: 12/29/2021 Delivery date:   Goldie, [place] [[id number]]  12/29/2021    Congetta, Odriscoll [[id number]]  12/29/2021 Delivering provider:    Clearence Ped [[id number]]  LUVERNA, DEGENHART, BoyB Cason [[id number]]  [place] Mode Date of discharge: 12/30/2021  Admitting diagnosis: PROM (premature rupture of membranes) [O42.90] Intrauterine pregnancy: [redacted]w[redacted]d     Secondary diagnosis:  Principal Problem:   Delivery of twins, both live Active Problems:   Moderate persistent asthma   Supervision of high-risk pregnancy   Twin pregnancy, twins dichorionic and diamniotic   Unwanted fertility   Preterm premature rupture of membranes (PPROM) with onset of labor after 24 hours of rupture in second trimester, antepartum   Anemia affecting pregnancy in third trimester   Preterm premature rupture of membranes (PPROM) delivered, current hospitalization   [redacted] weeks gestation of pregnancy  Additional problems: None    Discharge diagnosis: Preterm Pregnancy Delivered                                              Post partum procedures: None Augmentation:  None  Complications: None  Hospital course: Onset of Labor With Vaginal Delivery      21 y.o. yo G3P2002 at 106w4d was admitted in Active Labor on 12/29/2021. Patient had an uncomplicated labor course as follows:  Membrane Rupture Time/Date:    Lacrystal, Barbe [[id number]]      Jalaiya, [place] [[id number]]  7:49 AM ,   Brianna, Bennett [[id number]]  11/22/2021    Lenore, Moyano [[id number]]  12/29/2021   Delivery Method:   Clearence Ped [[id number]]  Vaginal, Spontaneous    Shaniqwa, Horsman [[id number]]  Vaginal, Spontaneous Episiotomy:    Rebeca, Valdivia [[id number]]  None    Caitlynn, Ju [place] [[id number]]  None Lacerations:     Ammy, Lienhard [[id number]]  None    Tomisha, Reppucci [[id number]]   None Patient had an uncomplicated postpartum course.  She is ambulating, tolerating a regular diet, passing flatus, and urinating well. Patient is discharged home in stable condition on 12/30/21.  Newborn Data: Birth date:   Kaleyah, Labreck [[id number]]  12/29/2021    Vercie, Pokorny [[id number]]  12/29/2021 Birth time:   Dereon, Williamsen [[id number]]  7:45 AM    Earley Favor [[id number]]  7:50 AM Gender:   Melea, Prezioso [[id number]]  Female    Sharen, Youngren [place] [[id number]]  Female Living status:   Creta, Dorame [[id number]]  Living    Alyscia, [place] [[id number]]  Living Apgars:   Cylie, Dor [[id number]]  [address] [place] [[id number]]  [place] ,   Sumaiya, Arruda [place] [[id number]]  [address] [place] [[id number]]  9 Weight:   Marybelle, Giraldo [[id number]]  1720 g    Arbie, Reisz [[id number]]  1800 g   Magnesium Sulfate received: No BMZ received: Yes Rhophylac:No MMR:N/A T-DaP:Given prenatally Flu: N/A Transfusion:No  Physical exam  Vitals:   12/29/21 1611 12/29/21 1930 12/29/21 2329 12/29/21 2330  BP: 120/70 109/76  112/74  Pulse: 78 74  67  Resp: $Remo'16 18  16  'WUJRs$ Temp: 97.9 F (36.6 C) 99 F (37.2 C)  18 F (36.7 C)  TempSrc: Oral Oral  Oral  SpO2: 100% 100% 99%   Height:       General: alert, cooperative, and no distress Lochia: appropriate Uterine Fundus: firm, NT Incision: N/A DVT Evaluation: No evidence of DVT seen on physical exam. Negative Homan's sign. No cords or calf tenderness. No significant calf/ankle edema. Labs: Lab Results  Component Value Date   WBC 20.9 (H) 12/29/2021   HGB 10.2 (L) 12/29/2021   HCT 32.0 (L) 12/29/2021   MCV 80.2 12/29/2021   PLT 187 12/29/2021      Latest Ref Rng & Units 08/11/2021   10:52 AM  CMP  Glucose 70 - 99 mg/dL 74   BUN 6 - 20 mg/dL 7   Creatinine 0.57 - 1.00 mg/dL 0.49   Sodium 134 - 144 mmol/L 138   Potassium 3.5 - 5.2 mmol/L 4.2   Chloride 96 - 106 mmol/L 103   CO2  20 - 29 mmol/L 22   Calcium 8.7 - 10.2 mg/dL 9.1   Total Protein 6.0 - 8.5 g/dL 6.5   Total Bilirubin 0.0 - 1.2 mg/dL 0.2   Alkaline Phos 44 - 121 IU/L 50   AST 0 - 40 IU/L 12   ALT 0 - 32 IU/L 6    Edinburgh Score:    08/02/2018    1:06 AM  Edinburgh Postnatal Depression Scale Screening Tool  I have been able to laugh and see the funny side of things. 0  I have looked forward with enjoyment to things. 0  I have blamed myself unnecessarily when things went wrong. 0  I have been anxious or worried for no good reason. 0  I have felt scared or panicky for no good reason. 0  Things have been getting on top of me. 0  I have been so unhappy that I have had difficulty sleeping. 0  I have felt sad or miserable. 0  I have been so unhappy that I have been crying. 0  The thought of harming myself has occurred to me. 0  Edinburgh Postnatal Depression Scale Total 0     After visit meds:  Allergies as of 12/30/2021   No Known Allergies      Medication List     STOP taking these medications    aspirin EC 81 MG tablet       TAKE these medications    acetaminophen 500 MG tablet Commonly known as: TYLENOL Take 1,000 mg by mouth every 6 (six) hours as needed for moderate pain.   albuterol 108 (90 Base) MCG/ACT inhaler Commonly known as: VENTOLIN HFA Inhale 1-2 puffs into the lungs every 6 (six) hours as needed for wheezing or shortness of breath.   cyclobenzaprine 10 MG tablet Commonly known as: FLEXERIL Take 1 tablet (10 mg total) by mouth 3 (three) times daily as needed for muscle spasms.   ibuprofen 600 MG tablet Commonly known as: ADVIL Take 1 tablet (600 mg total) by mouth every 6 (six) hours as needed for headache, mild pain or fever.   metroNIDAZOLE 500 MG tablet Commonly known as: Flagyl Take 1 tablet (500 mg total) by mouth 2 (two) times daily.   PRENATAL VITAMIN PO Take by mouth daily.         Discharge home in stable condition Infant Feeding: Bottle and  Breast Infant Disposition:NICU Discharge instruction: per After Visit Summary and Postpartum booklet. Activity: Advance as tolerated. Pelvic rest for 6 weeks.  Diet: routine diet Future Appointments: Future  Appointments  Date Time Provider [place]  02/16/2022  3:50 PM Gavin Pound, CNM [place] None   Follow up Visit:  Message sent 9/6/232  Please schedule this patient for a In person postpartum visit in 6 weeks with the following provider: Any provider. Additional Postpartum F/U: none   High risk pregnancy complicated by:  PPROM  Delivery mode:     Rossanna, Spitzley [[id number]]  Vaginal, Spontaneous    Angelynn, Lemus [[id number]]  Vaginal, Spontaneous Anticipated Birth Control:  Interval BTL done PP   12/30/2021 Verita Schneiders, MD

## 2021-12-29 NOTE — H&P (Addendum)
OBSTETRIC ADMISSION HISTORY AND PHYSICAL  Carol Hendrix is a 21 y.o. female G74P2002 with IUP at [redacted]w[redacted]d presenting for SOL. Patient has a di/di twin pregnancy. Had PPROM for twin A at [redacted]w[redacted]d, was admitted for latency antibiotics between 7/31 and 8/15; however she left AMA due to concerns about child care. She presents today with contractions.   She reports +FMs, no VB, no blurry vision, headaches or peripheral edema, and RUQ pain.  She plans on breast and bottle feeding. She request BTL for birth control. She received her prenatal care at  [facility]   Dating: By LMP --->  Estimated Date of Delivery: 02/19/22  Sono:    @[redacted]w[redacted]d , CWD, normal anatomy, Baby A - cephalic presentation, longitudinal lie, 1500g, 38% EFW Baby B - breech presentation, EFW 1598g, 47%  Prenatal History/Complications:  Twin gestation  Preterm PROM - completed course of steroids, discharged from hospital AMA due to needing child care.  Past Medical History: Past Medical History:  Diagnosis Date   Anemia    Asthma     Past Surgical History: Past Surgical History:  Procedure Laterality Date   NO PAST SURGERIES      Obstetrical History: OB History     Gravida  3   Para  2   Term  2   Preterm  0   AB  0   Living  2      SAB  0   IAB  0   Ectopic  0   Multiple  0   Live Births  2           Social History Social History   Socioeconomic History   Marital status: Single    Spouse name: Not on file   Number of children: Not on file   Years of education: Not on file   Highest education level: Not on file  Occupational History   Not on file  Tobacco Use   Smoking status: Former    Packs/day: 0.25    Types: Cigarettes   Smokeless tobacco: Never   Tobacco comments:    rolls marijuana in with tobacco  Vaping Use   Vaping Use: Never used  Substance and Sexual Activity   Alcohol use: Not Currently   Drug use: Not Currently    Types: Marijuana    Comment: last smoked marijuana  one year ago as of 07/16/2021   Sexual activity: Yes  Other Topics Concern   Not on file  Social History Narrative   Not on file   Social Determinants of Health   Financial Resource Strain: Not on file  Food Insecurity: No Food Insecurity (12/28/2021)   Hunger Vital Sign    Worried About Running Out of Food in the Last Year: Never true    Ran Out of Food in the Last Year: Never true  Transportation Needs: No Transportation Needs (12/28/2021)   PRAPARE - 02/27/2022 (Medical): No    Lack of Transportation (Non-Medical): No  Physical Activity: Not on file  Stress: Not on file  Social Connections: Not on file    Family History: Family History  Problem Relation Age of Onset   Healthy Mother    Healthy Father    Pulmonary fibrosis Maternal Grandmother     Allergies: No Known Allergies  Medications Prior to Admission  Medication Sig Dispense Refill Last Dose   acetaminophen (TYLENOL) 500 MG tablet Take 1,000 mg by mouth every 6 (six) hours as needed  for moderate pain.      albuterol (VENTOLIN HFA) 108 (90 Base) MCG/ACT inhaler Inhale 1-2 puffs into the lungs every 6 (six) hours as needed for wheezing or shortness of breath. 8 g 0    aspirin EC 81 MG tablet Take 1 tablet (81 mg total) by mouth daily. Take after 12 weeks for prevention of preeclampsia later in pregnancy 300 tablet 2    metroNIDAZOLE (FLAGYL) 500 MG tablet Take 1 tablet (500 mg total) by mouth 2 (two) times daily. 14 tablet 0    Prenatal Vit-Fe Fumarate-FA (PRENATAL VITAMIN PO) Take by mouth daily.        Review of Systems   All systems reviewed and negative except as stated in HPI  Blood pressure (!) 142/86, pulse 92, last menstrual period 05/15/2021. General appearance: alert, cooperative, and appears stated age Lungs: clear to auscultation bilaterally Heart: regular rate and rhythm Abdomen: soft, non-tender; bowel sounds normal Pelvic: deferred Extremities: Homans sign is  negative, no sign of DVT   Fetal monitoring Twin A -  Baseline 140 bpm, moderate variability, >2 15 X15 accels, no decels  Twin A -  Baseline 140 bpm, moderate variability, >2 15 X15 accels, no decels  Uterine activity  Dilation: 5 Effacement (%): 80 Station: -2 Exam by:: Judeth Horn, NP, with uterine contractions every 7-8 mins  Prenatal labs: ABO, Rh: --/--/O POS (09/04 0730) Antibody: NEG (09/04 0730) Rubella: 4.29 (04/19 1052) RPR: Non Reactive (08/16 1027)  HBsAg: Negative (04/19 1052)  HIV: Non Reactive (08/16 1027)  GBS:   due 1 hr Glucola normal Genetic screening  LR Anatomy US normal  Prenatal Transfer Tool  Maternal Diabetes: No Genetic Screening: Normal Maternal Ultrasounds/Referrals: Other: PPROM, di/di twin gestation Fetal Ultrasounds or other Referrals:  None Maternal Substance Abuse:  Yes:  Type: Smoker Significant Maternal Medications:  None Significant Maternal Lab Results:  None Number of Prenatal Visits:greater than 3 verified prenatal visits Other Comments:  None  Results for orders placed or performed during the hospital encounter of 12/27/21 (from the past 24 hour(s))  Group A Strep by PCR   Collection Time: 12/28/21  7:55 AM   Specimen: Throat; Sterile Swab  Result Value Ref Range   Group A Strep by PCR NOT DETECTED NOT DETECTED  Resp Panel by RT-PCR (Flu A&B, Covid) Anterior Nasal Swab   Collection Time: 12/28/21 11:11 AM   Specimen: Anterior Nasal Swab  Result Value Ref Range   SARS Coronavirus 2 by RT PCR NEGATIVE NEGATIVE   Influenza A by PCR NEGATIVE NEGATIVE   Influenza B by PCR NEGATIVE NEGATIVE    Patient Active Problem List   Diagnosis Date Noted   [redacted] weeks gestation of pregnancy    Preterm premature rupture of membranes (PPROM) with unknown onset of labor 12/27/2021   Anemia affecting pregnancy in third trimester 12/19/2021   Preterm premature rupture of membranes (PPROM) in second trimester, antepartum 11/22/2021   Twin  pregnancy, twins dichorionic and diamniotic 08/11/2021   Syncope 08/11/2021   Unwanted fertility 08/11/2021   Supervision of high-risk pregnancy 07/21/2021   Moderate persistent asthma 03/25/2018   Tobacco use disorder 03/25/2018    Assessment/Plan:  Carol Hendrix is a 21 y.o. G3P2002 at [redacted]w[redacted]d here in active labor. She was admitted 09/04 and 09/05 due to [facility] but left AMA on 09/05.  Has had latency antibiotics and steroids. Last dose of BMX - 09/01.  #Labor: progressing spontaneously. Has had latency antibiotics and steroids with last dose BMZ 12/24/21 #FWB: Reactive NST for both babies A and B #ID:  GBS negative #MOF: breast and bottle #MOC: BTL -signed consent form 12/08/2021 #Circ:  Yes for baby boy  Moody Bruins, MD Resident Physician 12/29/2021     Fellow Attestation  I saw and evaluated the patient, performing the key elements of the service.I  personally performed or re-performed the history, physical exam, and medical decision making activities of this service and have verified that the service and findings are accurately documented in the resident's note. I developed the management plan that is described in the resident's note, and I agree with the content, with my edits above.    Derrel Nip, MD OB Fellow

## 2021-12-29 NOTE — Anesthesia Procedure Notes (Signed)
Epidural Patient location during procedure: OB Start time: 12/29/2021 6:46 AM End time: 12/29/2021 6:52 AM  Staffing Anesthesiologist: Bethena Midget, MD  Preanesthetic Checklist Completed: patient identified, IV checked, site marked, risks and benefits discussed, surgical consent, monitors and equipment checked, pre-op evaluation and timeout performed  Epidural Patient position: sitting Prep: DuraPrep and site prepped and draped Patient monitoring: continuous pulse ox and blood pressure Approach: midline Location: L4-L5 Injection technique: LOR air  Needle:  Needle type: Tuohy  Needle gauge: 17 G Needle length: 9 cm and 9 Needle insertion depth: 5 cm cm Catheter type: closed end flexible Catheter size: 19 Gauge Catheter at skin depth: 10 cm Test dose: negative  Assessment Events: blood not aspirated, injection not painful, no injection resistance, no paresthesia and negative IV test

## 2021-12-29 NOTE — Anesthesia Preprocedure Evaluation (Signed)
Anesthesia Evaluation  Patient identified by MRN, date of birth, ID band Patient awake    Reviewed: Allergy & Precautions, NPO status , Patient's Chart, lab work & pertinent test results  Airway Mallampati: I  TM Distance: >3 FB Neck ROM: Full    Dental no notable dental hx. (+) Teeth Intact, Dental Advisory Given   Pulmonary asthma , former smoker,    Pulmonary exam normal breath sounds clear to auscultation       Cardiovascular Exercise Tolerance: Good negative cardio ROS Normal cardiovascular exam Rhythm:Regular Rate:Normal     Neuro/Psych negative neurological ROS     GI/Hepatic negative GI ROS, Neg liver ROS,   Endo/Other    Renal/GU      Musculoskeletal   Abdominal   Peds  Hematology  (+) Blood dyscrasia, anemia , Hgb 10.0 Pl t206   Anesthesia Other Findings   Reproductive/Obstetrics (+) Pregnancy                             Anesthesia Physical  Anesthesia Plan  ASA: 2  Anesthesia Plan: Epidural   Post-op Pain Management: Minimal or no pain anticipated   Induction: Intravenous  PONV Risk Score and Plan: 2  Airway Management Planned: Simple Face Mask  Additional Equipment: None  Intra-op Plan:   Post-operative Plan:   Informed Consent: I have reviewed the patients History and Physical, chart, labs and discussed the procedure including the risks, benefits and alternatives for the proposed anesthesia with the patient or authorized representative who has indicated his/her understanding and acceptance.       Plan Discussed with: Anesthesiologist and CRNA  Anesthesia Plan Comments:         Anesthesia Quick Evaluation

## 2021-12-29 NOTE — MAU Note (Signed)
Report called to LD charge nurse. Room assignment given.  Report to NICU charge nurse given.

## 2021-12-29 NOTE — MAU Note (Signed)
..  Carol Hendrix is a 21 y.o. at [redacted]w[redacted]d here in MAU reporting: EMS arrival for CTX that began 0200 12/29/2021. Pt states the ctx are 3-5 minutes apart. Pt states +FM. Pt states she started leaking fluid 9/1 and had a big gush a few days ago. Pt states it happened on Monday. Pt states she had a little VB.   Onset of complaint: 0200 12/29/2021 Pain score: 10/10 Vitals:   12/29/21 0545 12/29/21 0552  BP: (!) 142/86   Pulse: 92   Temp:  97.9 F (36.6 C)     FHT:  Baby A 147 Baby B 142 Lab orders placed from triage:

## 2021-12-29 NOTE — Anesthesia Postprocedure Evaluation (Signed)
Anesthesia Post Note  Patient: Carol Hendrix  Procedure(s) Performed: AN AD HOC LABOR EPIDURAL     Patient location during evaluation: Mother Baby Anesthesia Type: Epidural Level of consciousness: awake and alert Pain management: pain level controlled Vital Signs Assessment: post-procedure vital signs reviewed and stable Respiratory status: spontaneous breathing, nonlabored ventilation and respiratory function stable Cardiovascular status: stable Postop Assessment: no headache, no backache and epidural receding Anesthetic complications: no   No notable events documented.  Last Vitals:  Vitals:   12/29/21 1104 12/29/21 1611  BP: 114/68 120/70  Pulse: 78 78  Resp: 16 16  Temp: 36.6 C 36.6 C  SpO2: 99% 100%    Last Pain:  Vitals:   12/29/21 1611  TempSrc: Oral  PainSc:    Pain Goal: Patients Stated Pain Goal: 3 (12/29/21 1350)                 Nathali Vent

## 2021-12-29 NOTE — MAU Provider Note (Signed)
Event Date/Time   First Provider Initiated Contact with Patient 12/29/21 0555      S Ms. Carol Hendrix is a 21 y.o. G3P2002 at [redacted]w[redacted]d with di/di twins who presents to MAU via EMS with complaint of contractions. Reports painful contractions every 5 minutes since 2 am. Has known PPROM since 27 weeks. Denies fever. Has seen some vaginal spotting.    O BP (!) 142/86   Pulse 92   LMP 05/15/2021  Physical Exam Vitals and nursing note reviewed. Exam conducted with a chaperone present.  Constitutional:      General: She is in acute distress.     Appearance: Normal appearance.  Eyes:     General: No scleral icterus.    Conjunctiva/sclera: Conjunctivae normal.  Pulmonary:     Effort: Pulmonary effort is normal. No respiratory distress.  Genitourinary:    Vagina: No bleeding.     Comments: Dilation: 5 Effacement (%): 80 Station: -2 Presentation: Vertex Exam by:: Judeth Horn, NP  Skin:    General: Skin is warm and dry.  Neurological:     Mental Status: She is alert.     Fetal Tracing: Baby A Baseline: 140 Variability: moderate Accelerations: 15x15 Decelerations: none  Baby B Baseline: 145 Variability: moderate Accelerations: tracing inconsistent Decelerations: tracing inconsistent  Toco: not tracing    A PPROM Preterm labor 32 weeks  P Dr. Debroah Loop notified of exam -  A vertex by exam, B breech per u/s yesterday Admit to birthing suites Report given to labor team Nobie Putnam)  Judeth Horn, NP 12/29/2021 5:57 AM

## 2021-12-29 NOTE — Plan of Care (Signed)
  Problem: Education: Goal: Knowledge of condition will improve Outcome: Progressing   

## 2021-12-30 ENCOUNTER — Other Ambulatory Visit (HOSPITAL_COMMUNITY): Payer: Self-pay

## 2021-12-30 MED ORDER — CYCLOBENZAPRINE HCL 10 MG PO TABS: 10.0000 mg | ORAL_TABLET | Freq: Three times a day (TID) | ORAL | 2 refills | Status: DC | PRN

## 2021-12-30 MED ORDER — IBUPROFEN 600 MG PO TABS: 600.0000 mg | ORAL_TABLET | Freq: Four times a day (QID) | ORAL | 2 refills | Status: DC | PRN

## 2021-12-30 MED ORDER — CYCLOBENZAPRINE HCL 10 MG PO TABS
10.0000 mg | ORAL_TABLET | Freq: Three times a day (TID) | ORAL | 3 refills | Status: DC | PRN
Start: 2021-12-30 — End: 2023-04-22
  Filled 2021-12-30: qty 30, 10d supply, fill #0

## 2021-12-30 MED ORDER — IBUPROFEN 600 MG PO TABS
600.0000 mg | ORAL_TABLET | Freq: Four times a day (QID) | ORAL | 3 refills | Status: DC | PRN
  Filled 2021-12-30: qty 60, 15d supply, fill #0

## 2021-12-30 NOTE — Clinical Social Work Maternal (Signed)
CLINICAL SOCIAL WORK MATERNAL/CHILD NOTE  Patient Details  Name: Carol Hendrix MRN: [id number] Date of Birth: Aug 13, 2000  Date:  12/30/2021  Clinical Social Worker Initiating Note:  Abundio Miu, [place] Date/Time: Initiated:  12/30/21/1226     Child's Name:  Carol Hendrix: Carol Hendrix; BoyB: Carol Hendrix   Biological Parents:  Mother, Father (Father: Semaje Wilheite-[place]iz[place])   Need for Interpreter:  None   Reason for Referral:  Parental Support of Premature Babies < 32 weeks/or Critically Ill babies   Address:  [address] [place] [address]    Phone number:  [phone]  Additional phone number:   Household Members/Support Persons (HM/SP):   Household Member/Support Person 1, Household Member/Support Person 2, Household Member/Support Person 3, Household Member/Support Person 4   HM/SP Name Relationship DOB or Age  HM/SP -Lee mother    HM/SP -2   sister    HM/SP -3 Doreatha Offer daughter 08/01/18  HM/SP -4 Triston Holmes son 08/21/19  HM/SP -5        HM/SP -6        HM/SP -7        HM/SP -8          Natural Supports (not living in the home):      Professional Supports: None   Employment: Animator   Type of Work: Educational psychologist   Education:  [employer] school graduate   Homebound arranged:    Museum/gallery curator Resources:  [employer]   Other Resources:  [employer], Physicist, medical     Cultural/Religious Considerations Which May Impact Care:    Strengths:  Ability to meet basic needs  , Engineer, materials, Understanding of illness   Psychotropic Medications:         Pediatrician:    Solicitor area  Pediatrician List:   [facility]  [place]      Pediatrician Fax Number:    Risk Factors/Current Problems:  Childcare (Per MOB's chart, childcare identified as a concern; MOB did not mention childcare issues during assessment)   Cognitive State:   Goal Oriented  , Insightful  , Alert  , Able to Concentrate  , Linear Thinking     Mood/Affect:  Comfortable  , Calm  , Interested  , Relaxed     CSW Assessment: CSW met with MOB at bedside to complete psychosocial assessment. CSW introduced self and explained role. MOB was welcoming, polite, and remained engaged during assessment. MOB reported that she resides with her mother, sister, and 2 kids. 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 MOB reported that she has started to shop for infants and shared that she has a basinet. CSW informed MOB about [facility] [employer] [facility]'s [employer] if any assistance is needed obtaining items for the infants. MOB reported that a referral for all essential items would be helpful, CSW agreed to complete a referral. MOB reported that she is picking up a car seat today from a community agency, noting they are only able to provide one car seat. CSW informed MOB about the hospital car seat program as resource, MOB reported that she is interested in getting the other car seat from the hospital. CSW agreed to provide a car seat closer to infant's discharge. CSW inquired about MOB's support system, MOB reported that her mom is a support.   CSW inquired about MOB's mental health history. MOB denied any  mental health history. MOB denied any history of postpartum depression. CSW inquired about how MOB was feeling emotionally since giving birth, MOB reported that she was feeling good. MOB presented calm and did not demonstrate any acute mental health signs/symptoms. CSW assessed for safety, MOB denied SI, HI, and domestic violence.   CSW provided education regarding the baby blues period vs. perinatal mood disorders, discussed treatment and gave resources for mental health follow up if concerns arise.  CSW recommends self-evaluation during the postpartum time period using the New Mom Checklist from Postpartum Progress and encouraged MOB to contact a medical  professional if symptoms are noted at any time.    CSW provided review of Sudden Infant Death Syndrome (SIDS) precautions.    CSW and MOB discussed infants NICU admission. CSW informed MOB about the NICU, what to expect, and resources/supports available while infant is admitted to the NICU. MOB reported that everyone in the NICU has been great and she feels well informed about infants care. MOB denied any transportation barriers with visiting infants in the NICU. MOB denied any questions/concerns regarding the NICU. MOB reported that meal vouchers would be helpful, CSW provided MOB with 7 meal vouchers. MOB thanked CSW.   CSW inquired about MOB needing any additional resources/supports, MOB denied any needs.   CSW completed FSN referral for requested items.   CSW will continue to offer resources/supports while infant is admitted the NICU. MOB opted for CSW to check in weekly.    CSW Plan/Description:  Sudden Infant Death Syndrome (SIDS) Education, Psychosocial Support and Ongoing Assessment of Needs, Perinatal Mood and Anxiety Disorder (PMADs) Education, Other Patient/Family Education, Other Information/Referral to [employer], [place] 12/30/2021, 12:30 PM

## 2021-12-30 NOTE — Lactation Note (Signed)
This note was copied from a baby's chart.  NICU Lactation Consultation Note  Patient Name: Sonora Catlin NMMHW'K Date: 12/30/2021 Age:21 years  Subjective Reason for consult: Initial assessment; 1st time breastfeeding; NICU baby; Infant < 6lbs; Preterm <34wks; Multiple gestation; Maternal discharge  Visited with family of 14 hours old pre-term NICU twin males, MOB pumping when entered the room, praised her for her efforts. Noticed that she's been pumping unilaterally, encouraged bilateral pumping for best practices. Ms. Rybolt is getting discharged today. Reviewed discharge education, lactogenesis II, pumping schedule, pump settings, benefits of premature milk and anticipatory guidelines.   Objective Infant data: Mother's Current Feeding Choice: Breast Milk and Donor Milk   Maternal data: [id number]  Vaginal, Spontaneous Significant Breast History:: moderate breast changes Current breast feeding challenges:: NICU admission Does the patient have breastfeeding experience prior to this delivery?: No Pumping frequency: q 3 hours (recommended) Pumped volume: 10 mL Risk factor for low milk supply:: infant separation, prematurity Pump: Personal (DEBP at home)  Assessment Infant: In NICU  Maternal: Milk volume: Normal  Intervention/Plan Interventions: Breast feeding basics reviewed; DEBP; Education; [employer] brochure Tools: Pump Pump Education: Setup, frequency, and cleaning; Milk Storage  Plan of care: Encouraged bilateral pumping every 3 hours, ideally 8 pumping sessions in 24 hours She'll take all her pump parts to baby's room after her discharge She'll switch from initiation to expression mode once she starts getting 20 ml of EBM combined  No other support person at this time. All questions and concerns answered, family to contact [facility] services PRN.  Consult Status: NICU follow-up  NICU Follow-up type: Verify onset of copious milk; Verify absence of engorgement; Weekly NICU  follow up    [employer] 12/30/2021, 10:42 AM

## 2021-12-30 NOTE — Plan of Care (Signed)
  Problem: Education: Goal: Knowledge of condition will improve Outcome: Adequate for Discharge Goal: Individualized Educational Video(s) Outcome: Adequate for Discharge Goal: Individualized Newborn Educational Video(s) Outcome: Adequate for Discharge   Problem: Activity: Goal: Will verbalize the importance of balancing activity with adequate rest periods Outcome: Adequate for Discharge Goal: Ability to tolerate increased activity will improve Outcome: Adequate for Discharge   Problem: Coping: Goal: Ability to identify and utilize available resources and services will improve Outcome: Adequate for Discharge   Problem: Life Cycle: Goal: Chance of risk for complications during the postpartum period will decrease Outcome: Adequate for Discharge   Problem: Role Relationship: Goal: Ability to demonstrate positive interaction with newborn will improve Outcome: Adequate for Discharge

## 2021-12-31 LAB — SURGICAL PATHOLOGY

## 2022-01-04 ENCOUNTER — Ambulatory Visit: Payer: Medicaid Other

## 2022-01-08 ENCOUNTER — Inpatient Hospital Stay (HOSPITAL_COMMUNITY): Payer: Medicaid Other

## 2022-01-08 ENCOUNTER — Inpatient Hospital Stay (HOSPITAL_COMMUNITY)
Admission: AD | Admit: 2022-01-08 | Payer: Medicaid Other | Source: Home / Self Care | Admitting: Obstetrics and Gynecology

## 2022-01-09 ENCOUNTER — Ambulatory Visit: Payer: Self-pay

## 2022-01-09 NOTE — Lactation Note (Signed)
This note was copied from a baby's chart. Lactation Consultation Note I returned to assist with 1200 feeding. [name] was able to position baby B at breast with minimal assistance. Infant made several brief attempts to latch and then slept at breast during gavage. We reviewed normalcy of his behaviors. LC assistance available prn.   Patient Name: Carol Hendrix SFSEL'T Date: 01/09/2022 Reason for consult: Breastfeeding assistance Age:22 days   LATCH Score Latch: Repeated attempts needed to sustain latch, nipple held in mouth throughout feeding, stimulation needed to elicit sucking reflex.  Audible Swallowing: None  Type of Nipple: Everted at rest and after stimulation  Comfort (Breast/Nipple): Soft / non-tender  Hold (Positioning): Assistance needed to correctly position infant at breast and maintain latch.  LATCH Score: 6  Interventions Interventions: Education;Infant Driven Feeding Algorithm education  Consult Status Consult Status: NICU follow-up Date: 01/09/22 Follow-up type: In-patient   Gwynne Edinger 01/09/2022, 12:30 PM

## 2022-01-09 NOTE — Lactation Note (Signed)
This note was copied from a baby's chart.  NICU Lactation Consultation Note  Patient Name: Brittani Purdum KGSUP'J Date: 01/09/2022 Age:22 days   Subjective Reason for consult: Follow-up assessment Sherri is pumping frequently during the day but takes a 7-hour break at night to sleep. She would like to challenge infants at breast. We reviewed IDF and I will return to assist at feeding time.  Objective Infant data: Mother's Current Feeding Choice: Breast Milk and Formula  Infant feeding assessment Scale for Readiness: 3  Maternal data: [id number]  Vaginal, Spontaneous  Pumping frequency: q3-4 during the day Pumped volume: 75 mL  Risk factor for low milk supply:: long pumping break in the night   Pump: Personal (DEBP at home)  Assessment Maternal: Milk volume: Normal   Intervention/Plan Interventions: Infant Driven Feeding Algorithm education; Education  Plan: Consult Status: NICU follow-up  NICU Follow-up type: Assist with IDF-1 (Mother to pre-pump before breastfeeding)   Gwynne Edinger 01/09/2022, 10:55 AM

## 2022-01-09 NOTE — Lactation Note (Signed)
This note was copied from a baby's chart. Lactation Consultation Note Returned to assist with initial bf challenge. Infant briefly licked/nuzzled at breast but did not latch. We reviewed norms and expectations at 34+1. SPL present for additional information and support.   Patient Name: Carol Hendrix JSHFW'Y Date: 01/09/2022 Reason for consult: Breastfeeding assistance Age:22 days  Feeding Mother's Current Feeding Choice: Breast Milk and Formula  LATCH Score Latch: Too sleepy or reluctant, no latch achieved, no sucking elicited.  Audible Swallowing: None  Type of Nipple: Everted at rest and after stimulation  Comfort (Breast/Nipple): Soft / non-tender  Hold (Positioning): Assistance needed to correctly position infant at breast and maintain latch.  LATCH Score: 5   Lactation Tools Discussed/Used Pumping frequency: q3-4 during the day Pumped volume: 75 mL  Interventions Interventions: Infant Driven Feeding Algorithm education;Education;Position options;Support pillows  Consult Status Consult Status: NICU follow-up Date: 01/09/22 Follow-up type: In-patient   Gwynne Edinger 01/09/2022, 11:23 AM

## 2022-01-11 ENCOUNTER — Ambulatory Visit: Payer: Medicaid Other

## 2022-01-11 ENCOUNTER — Telehealth: Payer: Self-pay

## 2022-01-11 ENCOUNTER — Other Ambulatory Visit: Payer: Self-pay | Admitting: Family Medicine

## 2022-01-11 ENCOUNTER — Ambulatory Visit: Payer: Self-pay

## 2022-01-11 MED ORDER — METOCLOPRAMIDE HCL 10 MG PO TABS: 10.0000 mg | ORAL_TABLET | Freq: Four times a day (QID) | ORAL | 2 refills | Status: DC

## 2022-01-11 NOTE — Progress Notes (Signed)
Rx for reglan to help with milk supply--BF twins.

## 2022-01-11 NOTE — Lactation Note (Signed)
This note was copied from a baby'Carol chart.  NICU Lactation Consultation Note  Patient Name: Carol Hendrix EQAST'M Date: 01/11/2022 Age:21 years  Subjective Reason for consult: Follow-up assessment; Mother'Carol request; Late-preterm 34-36.6wks; Infant < 6lbs; Multiple gestation; NICU baby; 1st time breastfeeding  Visited with family of 81 50/81 weeks old (adjusted) NICU twin males, Carol Hendrix called for lactation because she had a question regarding her supply. She reported that she'Carol still pumping only 5 times/24 hours and going + 6 hours without pumping at night (she'Carol been doing it since birth) but now her supply has drastically decreased; she used to get 4-5 oz. per pumping session before. Reviewed several strategies to increase supply but pointed out that nipple stimulation will be the safest way to increase supply.   Objective Infant data: Mother'Carol Current Feeding Choice: Breast Milk and Formula  Infant feeding assessment Scale for Readiness: 2  Maternal data: [id number]  Vaginal, Spontaneous Pumping frequency: 5 times/24 hours Pumped volume: 30 mL Pump: Personal (DEBP at home)  Assessment Infant: In NICU  Maternal: Milk volume: Low  Intervention/Plan Interventions: Breast feeding basics reviewed; DEBP; Education Tools: Pump Pump Education: Setup, frequency, and cleaning; Milk Storage  Plan of care: Encouraged bilateral pumping every 3 hours, ideally 8 pumping sessions in 24 hours She'll try power pumping in the AM She'll try not to go more than 6 hours without pumping at night She'll call her OB for a Rx for Reglan and/or Moringa OTC   No other support person at this time. All questions and concerns answered, family to contact [facility] services PRN.  Consult Status: NICU follow-up  NICU Follow-up type: Weekly NICU follow up; Assist with IDF-2 (Mother does not need to pre-pump before breastfeeding)   Carol Hendrix Carol Hendrix 01/11/2022, 1:10 PM

## 2022-01-11 NOTE — Telephone Encounter (Signed)
Patient called requesting a rx for Reglan to helo increase her milk supply. Patient delivered twins on 9/6 and has been working with lactation in the NICU to help increase milk production. Lactation consultant recommended patient request rx for Reglan.

## 2022-01-12 ENCOUNTER — Encounter: Payer: Medicaid Other | Admitting: Obstetrics and Gynecology

## 2022-01-19 ENCOUNTER — Encounter: Payer: Medicaid Other | Admitting: Obstetrics and Gynecology

## 2022-01-20 ENCOUNTER — Other Ambulatory Visit (HOSPITAL_COMMUNITY): Payer: Self-pay

## 2022-01-21 NOTE — Discharge Summary (Signed)
Patient ID: ANGELINA VENARD MRN: [id number] DOB/AGE: Feb 15, 2001 21 y.o.  Admit date: 12/27/2021 Discharge date: 01/21/2022  Admission Diagnoses: IUP 32 2/7 weeks, DI/Di twins, PROM twin A  Discharge Diagnoses: SAA, pt left AMA at 32 3/7 weeks  Prenatal Procedures: ultrasound  Consults: Neonatology, Maternal Fetal Medicine  Hospital Course:  This is a 21 y.o. Q2S6015 with IUP at [redacted]w[redacted]d admitted for above Dx. Was given IV and oral antibiotics as per PROM protocol. Received BMZ for FLM. Pt left AMA in the evening of 12/28/2021 around 7pm  Discharge Exam:   Physical Examination: Not completed as pt left AMA   Significant Diagnostic Studies:  No results found for this or any previous visit (from the past 168 hour(s)).   Discharge Condition: Stable  Disposition:  There are no questions and answers to display.          Allergies as of 12/28/2021   No Known Allergies      Medication List     ASK your doctor about these medications    acetaminophen 500 MG tablet Commonly known as: TYLENOL Take 1,000 mg by mouth every 6 (six) hours as needed for moderate pain.   albuterol 108 (90 Base) MCG/ACT inhaler Commonly known as: VENTOLIN HFA Inhale 1-2 puffs into the lungs every 6 (six) hours as needed for wheezing or shortness of breath.   metroNIDAZOLE 500 MG tablet Commonly known as: Flagyl Take 1 tablet (500 mg total) by mouth 2 (two) times daily.   PRENATAL VITAMIN PO Take by mouth daily.         Signed: Mora Bellman M.D. 01/21/2022, 7:42 AM

## 2022-01-23 DIAGNOSIS — Z419 Encounter for procedure for purposes other than remedying health state, unspecified: Secondary | ICD-10-CM | POA: Diagnosis not present

## 2022-01-26 ENCOUNTER — Encounter: Payer: Medicaid Other | Admitting: Obstetrics and Gynecology

## 2022-02-02 ENCOUNTER — Encounter: Payer: Medicaid Other | Admitting: Obstetrics and Gynecology

## 2022-02-09 ENCOUNTER — Encounter: Payer: Medicaid Other | Admitting: Obstetrics and Gynecology

## 2022-02-16 ENCOUNTER — Encounter: Payer: Medicaid Other | Admitting: Obstetrics and Gynecology

## 2022-02-16 ENCOUNTER — Ambulatory Visit: Payer: Medicaid Other

## 2022-02-22 ENCOUNTER — Encounter: Payer: Medicaid Other | Admitting: Obstetrics & Gynecology

## 2022-02-23 DIAGNOSIS — Z419 Encounter for procedure for purposes other than remedying health state, unspecified: Secondary | ICD-10-CM | POA: Diagnosis not present

## 2022-03-03 ENCOUNTER — Telehealth: Payer: Self-pay | Admitting: Emergency Medicine

## 2022-03-03 ENCOUNTER — Other Ambulatory Visit: Payer: Self-pay | Admitting: Emergency Medicine

## 2022-03-03 NOTE — Telephone Encounter (Signed)
Attempted RC to patient. Unable to leave vm.

## 2022-03-08 ENCOUNTER — Emergency Department (HOSPITAL_COMMUNITY)
Admission: EM | Admit: 2022-03-08 | Discharge: 2022-03-08 | Disposition: A | Discharge status: Home / Self Care | Payer: Medicaid Other | Source: Home / Self Care

## 2022-03-12 ENCOUNTER — Inpatient Hospital Stay (HOSPITAL_COMMUNITY)
Admission: AD | Admit: 2022-03-12 | Discharge: 2022-03-12 | Disposition: A | Discharge status: Home / Self Care | Payer: Medicaid Other | Attending: Obstetrics and Gynecology | Admitting: Obstetrics and Gynecology

## 2022-03-12 ENCOUNTER — Encounter (HOSPITAL_COMMUNITY): Payer: Self-pay | Admitting: Obstetrics and Gynecology

## 2022-03-12 DIAGNOSIS — N760 Acute vaginitis: Secondary | ICD-10-CM | POA: Insufficient documentation

## 2022-03-12 DIAGNOSIS — N939 Abnormal uterine and vaginal bleeding, unspecified: Secondary | ICD-10-CM | POA: Diagnosis not present

## 2022-03-12 DIAGNOSIS — B9689 Other specified bacterial agents as the cause of diseases classified elsewhere: Secondary | ICD-10-CM | POA: Insufficient documentation

## 2022-03-12 LAB — URINALYSIS, ROUTINE W REFLEX MICROSCOPIC
Bacteria, UA: NONE SEEN
Bilirubin Urine: NEGATIVE
Glucose, UA: NEGATIVE mg/dL
Ketones, ur: NEGATIVE mg/dL
Leukocytes,Ua: NEGATIVE
Nitrite: NEGATIVE
Protein, ur: NEGATIVE mg/dL
Specific Gravity, Urine: 1.02 (ref 1.005–1.030)
pH: 5 (ref 5.0–8.0)

## 2022-03-12 LAB — WET PREP, GENITAL
Sperm: NONE SEEN
Trich, Wet Prep: NONE SEEN
WBC, Wet Prep HPF POC: >=10 — AB
Yeast Wet Prep HPF POC: NONE SEEN

## 2022-03-12 LAB — POCT PREGNANCY, URINE: Preg Test, Ur: NEGATIVE

## 2022-03-12 MED ORDER — METRONIDAZOLE 500 MG PO TABS: 500.0000 mg | ORAL_TABLET | Freq: Two times a day (BID) | ORAL | 0 refills | Status: DC

## 2022-03-12 NOTE — MAU Note (Signed)
.  Carol Hendrix is a 21 y.o. at Unknown here in MAU reporting: she delivered on 12/29/21. Stated she has not stopped bleeding since she had her twins. Did not make her post partum appointment. Had called the office and told them about the bleeding. She was told that it can be normal to bleed up to 8 weeks after having twins. Pt stated the bleeding has never let up and she feels there is an odor now as well. C/o pain and cramping as well.  LMP:  Onset of complaint: 12/29/21 Pain score: 6 Vitals:   03/12/22 0133  BP: 107/72  Pulse: 75  Resp: 18  Temp: 98.4 F (36.9 C)     FHT:n/a Lab orders placed from triage:  u/a

## 2022-03-12 NOTE — MAU Provider Note (Signed)
History     CSN: [id number]  Arrival date and time: 03/12/22 0117   Event Date/Time   First Provider Initiated Contact with Patient 03/12/22 0227      Chief Complaint  Patient presents with   Vaginal Bleeding   Carol Hendrix is a 21 y.o. G3P2014 at 10 weeks Postpartum who receives care at [facility].  She presents today for Vaginal Bleeding.  Patient reports she has been bleeding since delivery.  She states it "slows down some days, but otherwise it is heavy."  She says when it is heavy she can bleed through pads and panties in 1 hour and uses overnight pads.  She states she passes nickel sized clots and has some pain and cramping that is relieved with ibuprofen. Patient denies current pain.  She states she has started to notice an odor that she describes as "rotten" that has been present for about 1 to 2 weeks.  She endorses recent sexual activity but no pain or discomfort.  She also denies issues or pain with urination, constipation, or diarrhea.       OB History     Gravida  3   Para  3   Term  2   Preterm  1   AB  0   Living  4      SAB  0   IAB  0   Ectopic  0   Multiple  1   Live Births  4           Past Medical History:  Diagnosis Date   Anemia    Asthma     Past Surgical History:  Procedure Laterality Date   NO PAST SURGERIES      Family History  Problem Relation Age of Onset   Healthy Mother    Healthy Father    Pulmonary fibrosis Maternal Grandmother     Social History   Tobacco Use   Smoking status: Former    Packs/day: 0.25    Types: Cigarettes   Smokeless tobacco: Never   Tobacco comments:    rolls marijuana in with tobacco  Vaping Use   Vaping Use: Every day   Substances: Nicotine  Substance Use Topics   Alcohol use: Not Currently   Drug use: Not Currently    Types: Marijuana    Comment: last smoked marijuana one year ago as of 07/16/2021    Allergies: No Known Allergies  Medications Prior to Admission   Medication Sig Dispense Refill Last Dose   acetaminophen (TYLENOL) 500 MG tablet Take 1,000 mg by mouth every 6 (six) hours as needed for moderate pain.      albuterol (VENTOLIN HFA) 108 (90 Base) MCG/ACT inhaler Inhale 1-2 puffs into the lungs every 6 (six) hours as needed for wheezing or shortness of breath. 8 g 0    cyclobenzaprine (FLEXERIL) 10 MG tablet Take 1 tablet (10 mg total) by mouth 3 (three) times daily as needed for muscle spasms. 30 tablet 3    ibuprofen (ADVIL) 600 MG tablet Take 1 tablet (600 mg total) by mouth every 6 (six) hours as needed for headache, mild pain or fever. 60 tablet 3    metoCLOPramide (REGLAN) 10 MG tablet Take 1 tablet (10 mg total) by mouth 4 (four) times daily. 60 tablet 2    metroNIDAZOLE (FLAGYL) 500 MG tablet Take 1 tablet (500 mg total) by mouth 2 (two) times daily. (Patient not taking: Reported on 12/29/2021) 14 tablet 0    Prenatal Vit-Fe  Fumarate-FA (PRENATAL VITAMIN PO) Take by mouth daily.       Review of Systems  Constitutional:  Negative for chills and fever.  Gastrointestinal:  Negative for nausea and vomiting.  Genitourinary:  Positive for vaginal bleeding and vaginal discharge. Negative for difficulty urinating and dysuria.  Neurological:  Negative for dizziness, light-headedness and headaches.   Physical Exam   Blood pressure 107/72, pulse 75, temperature 98.4 F (36.9 C), resp. rate 18, height 5\' 5"  (1.651 m), weight 67.6 kg, unknown if currently breastfeeding.  Physical Exam Vitals reviewed.  Constitutional:      Appearance: Normal appearance.  HENT:     Head: Normocephalic and atraumatic.  Eyes:     Conjunctiva/sclera: Conjunctivae normal.  Cardiovascular:     Rate and Rhythm: Normal rate.  Pulmonary:     Effort: Pulmonary effort is normal. No respiratory distress.  Abdominal:     Palpations: Abdomen is soft.     Tenderness: There is no abdominal tenderness.  Genitourinary:    General: Normal vulva.     Comments: Speculum  Exam: -Normal External Genitalia: Non tender, no apparent discharge at introitus.  -Vaginal Vault: Pink mucosa with good rugae. Scant amt frothy white pink blood removed with faux swab x 1 -wet prep collected -Cervix:Pink, no lesions, cysts, or polyps.  Appears closed. No active bleeding from os-GC/CT collected -Bimanual Exam:  No uterine tenderness. Adnexa normal.  Uterine size appropriate.    Musculoskeletal:     Cervical back: Normal range of motion.  Skin:    General: Skin is warm and dry.  Neurological:     Mental Status: She is alert and oriented to person, place, and time.  Psychiatric:        Mood and Affect: Mood normal.        Behavior: Behavior normal.     MAU Course  Procedures Results for orders placed or performed during the hospital encounter of 03/12/22 (from the past 24 hour(s))  Urinalysis, Routine w reflex microscopic Urine, Clean Catch     Status: Abnormal   Collection Time: 03/12/22  1:42 AM  Result Value Ref Range   Color, Urine YELLOW YELLOW   APPearance CLEAR CLEAR   Specific Gravity, Urine 1.020 1.005 - 1.030   pH 5.0 5.0 - 8.0   Glucose, UA NEGATIVE NEGATIVE mg/dL   Hgb urine dipstick SMALL (A) NEGATIVE   Bilirubin Urine NEGATIVE NEGATIVE   Ketones, ur NEGATIVE NEGATIVE mg/dL   Protein, ur NEGATIVE NEGATIVE mg/dL   Nitrite NEGATIVE NEGATIVE   Leukocytes,Ua NEGATIVE NEGATIVE   RBC / HPF 0-5 0 - 5 RBC/hpf   WBC, UA 0-5 0 - 5 WBC/hpf   Bacteria, UA NONE SEEN NONE SEEN   Squamous Epithelial / LPF 0-5 0 - 5   Mucus PRESENT   Pregnancy, urine POC     Status: None   Collection Time: 03/12/22  1:43 AM  Result Value Ref Range   Preg Test, Ur NEGATIVE NEGATIVE  Wet prep, genital     Status: Abnormal   Collection Time: 03/12/22  2:53 AM  Result Value Ref Range   Yeast Wet Prep HPF POC NONE SEEN NONE SEEN   Trich, Wet Prep NONE SEEN NONE SEEN   Clue Cells Wet Prep HPF POC PRESENT (A) NONE SEEN   WBC, Wet Prep HPF POC >=10 (A) <10   Sperm NONE SEEN      MDM Labs: UPT, UA Pelvic Exam: Wet Prep, GC/CT Prescription Assessment and Plan  21 year old  Postpartum State Vaginal Bleeding  Vaginal Odor  -POC Reviewed. -Encouraged usage of condoms and/or birth control to avoid unwanted pregnancy. -Discussed and performed pelvic exam. -Cultures collected. -Await results.   Cherre Robins 03/12/2022, 2:27 AM   Reassessment (3:25 AM)  -Results return as above. -Rx for metronidazole sent to pharmacy on file.  -Return precautions reviewed. -Discharged to home in stable condition.  Cherre Robins MSN, CNM Advanced Practice Provider, [facility] [employer]

## 2022-03-15 LAB — GC/CHLAMYDIA PROBE AMP (~~LOC~~) NOT AT ARMC
Chlamydia: NEGATIVE
Comment: NEGATIVE
Comment: NORMAL
Neisseria Gonorrhea: NEGATIVE

## 2022-03-17 DIAGNOSIS — J029 Acute pharyngitis, unspecified: Secondary | ICD-10-CM | POA: Diagnosis not present

## 2022-03-17 DIAGNOSIS — Z20828 Contact with and (suspected) exposure to other viral communicable diseases: Secondary | ICD-10-CM | POA: Diagnosis not present

## 2022-03-17 DIAGNOSIS — R051 Acute cough: Secondary | ICD-10-CM | POA: Diagnosis not present

## 2022-03-17 DIAGNOSIS — R062 Wheezing: Secondary | ICD-10-CM | POA: Diagnosis not present

## 2022-03-22 ENCOUNTER — Ambulatory Visit (INDEPENDENT_AMBULATORY_CARE_PROVIDER_SITE_OTHER): Payer: Medicaid Other | Admitting: Obstetrics and Gynecology

## 2022-03-22 ENCOUNTER — Encounter: Payer: Self-pay | Admitting: Obstetrics and Gynecology

## 2022-03-22 DIAGNOSIS — Z30013 Encounter for initial prescription of injectable contraceptive: Secondary | ICD-10-CM

## 2022-03-22 DIAGNOSIS — Z309 Encounter for contraceptive management, unspecified: Secondary | ICD-10-CM | POA: Insufficient documentation

## 2022-03-22 DIAGNOSIS — Z23 Encounter for immunization: Secondary | ICD-10-CM | POA: Diagnosis not present

## 2022-03-22 LAB — POCT URINE PREGNANCY: Preg Test, Ur: NEGATIVE

## 2022-03-22 MED ORDER — MEDROXYPROGESTERONE ACETATE 150 MG/ML IM SUSP: 150.0000 mg | INTRAMUSCULAR | 0 refills | Status: DC

## 2022-03-22 MED ORDER — MEDROXYPROGESTERONE ACETATE 150 MG/ML IM SUSP
150.0000 mg | Freq: Once | INTRAMUSCULAR | Status: AC
  Administered 2022-03-22: 150 mg via INTRAMUSCULAR

## 2022-03-22 NOTE — Progress Notes (Signed)
Pt had negative UPT in office today.  Office supply Depo was given per provider order. Pt made aware of Depo schedule policy.  Flu vaccine also given as ordered.   Administrations This Visit     medroxyPROGESTERone (DEPO-PROVERA) injection 150 mg     Admin Date 03/22/2022 Action Given Dose 150 mg Route Intramuscular Administered By Lanney Gins, CMA

## 2022-03-22 NOTE — Addendum Note (Signed)
Addended by: Marya Landry D on: 03/22/2022 10:57 AM   Modules accepted: Orders

## 2022-03-22 NOTE — Progress Notes (Signed)
    Post Partum Visit Note  Carol Hendrix is a 21 y.o. [phone] female who presents for a postpartum visit. She is  11  weeks postpartum following a normal spontaneous vaginal delivery of twins.  I have fully reviewed the prenatal and intrapartum course. The delivery was at 32 gestational weeks, follow PROM at 27 weeks. Anesthesia: epidural. Postpartum course has been unremarkable. Babies are doing well. Baby is feeding by bottle - Enfamil Nutramigen. Bleeding moderate lochia. Bowel function is normal. Bladder function is normal. Patient is sexually active. Last intercourse 2 weeks ago.  Contraception method is none.   Postpartum depression screening: negative.   The pregnancy intention screening data noted above was reviewed. Potential methods of contraception were discussed. The patient elected to proceed with No data recorded.    Health Maintenance Due  Topic Date Due   COVID-19 Vaccine (1) Never done   HPV VACCINES (1 - 2-dose series) Never done   INFLUENZA VACCINE  Never done    The following portions of the patient's history were reviewed and updated as appropriate: allergies, current medications, past family history, past medical history, past social history, past surgical history, and problem list.  Review of Systems Pertinent items noted in HPI and remainder of comprehensive ROS otherwise negative.  Objective:  There were no vitals taken for this visit.   General:  alert   Breasts:  not indicated  Lungs: clear to auscultation bilaterally  Heart:  regular rate and rhythm, S1, S2 normal, no murmur, click, rub or gallop  Abdomen: soft, non-tender; bowel sounds normal; no masses,  no organomegaly   Wound NA  GU exam:  not indicated       Assessment:    There are no diagnoses linked to this encounter.  NL postpartum exam.  Contraception   Plan:   Essential components of care per ACOG recommendations:  1.  Mood and well being: Patient with negative depression  screening today. Reviewed local resources for support.  - Patient tobacco use? No.   - hx of drug use? No.    2. Infant care and feeding:  -Patient currently breastmilk feeding? No.  -Social determinants of health (SDOH) reviewed in EPIC. No concerns  3. Sexuality, contraception and birth spacing - Patient does not want a pregnancy in the next year.  Desired family size is uncertain  - Reviewed reproductive life planning. Reviewed contraceptive methods based on pt preferences and effectiveness.  Patient desired Hormonal Injection today.   - Discussed birth spacing of 18 months  4. Sleep and fatigue -Encouraged family/partner/community support of 4 hrs of uninterrupted sleep to help with mood and fatigue  5. Physical Recovery  - Discussed patients delivery and complications. She describes her labor as good. - Patient had a Vaginal, no problems at delivery. Patient had a 1st degree laceration. Perineal healing reviewed. Patient expressed understanding - Patient has urinary incontinence? No. - Patient is safe to resume physical and sexual activity  6.  Health Maintenance - HM due items addressed Yes - Last pap smear  Diagnosis  Date Value Ref Range Status  08/11/2021   Final   - Negative for intraepithelial lesion or malignancy (NILM)   Pap smear not done at today's visit.  -Breast Cancer screening indicated? No.   7. Chronic Disease/Pregnancy Condition follow up: None  Depo Provera today and q 12 weeks  Nettie Elm, MD [facility] [employer], [facility]

## 2022-03-22 NOTE — Patient Instructions (Signed)

## 2022-03-25 DIAGNOSIS — Z419 Encounter for procedure for purposes other than remedying health state, unspecified: Secondary | ICD-10-CM | POA: Diagnosis not present

## 2022-04-06 ENCOUNTER — Ambulatory Visit
Admission: EM | Admit: 2022-04-06 | Discharge: 2022-04-06 | Disposition: A | Discharge status: Home / Self Care | Payer: Medicaid Other | Attending: Nurse Practitioner | Admitting: Nurse Practitioner

## 2022-04-06 DIAGNOSIS — Z3202 Encounter for pregnancy test, result negative: Secondary | ICD-10-CM | POA: Diagnosis not present

## 2022-04-06 DIAGNOSIS — R112 Nausea with vomiting, unspecified: Secondary | ICD-10-CM

## 2022-04-06 DIAGNOSIS — R55 Syncope and collapse: Secondary | ICD-10-CM | POA: Diagnosis not present

## 2022-04-06 LAB — POCT URINE PREGNANCY: Preg Test, Ur: NEGATIVE

## 2022-04-06 NOTE — ED Triage Notes (Signed)
Pt c/o nausea, vomiting and diarrhea x 2 days. The patient states last night she had a syncopal episode. The patient states this happened last time she was pregnant.   Home interventions: none

## 2022-04-06 NOTE — ED Provider Notes (Addendum)
[facility]    CSN: [id number] Arrival date & time: 04/06/22  1659      History   Chief Complaint Chief Complaint  Patient presents with   Loss of Consciousness   Diarrhea   Abdominal Pain    HPI Carol Hendrix is a 21 y.o. female presents for evaluation of nausea/vomiting.  Patient reports yesterday she had multiple episodes of vomiting and diarrhea.  No fevers, body aches, URI symptoms.  Reports this is since resolved but continues to have some indigestion and flatulence.  He is able to eat and drink normally today.  No dysuria.  Yesterday she was getting out of the shower and states she she had an unwitnessed syncopal episode.  She was found by her boyfriend on the floor.  She thinks she was only out for a couple seconds.  Reports she has had syncopal episodes in the past with pregnancy.  She recently received the Depo shot on November 28.  States she had a negative pregnancy test at that time.  She is sexually active and does not use birth control.  She denies any headache, chest pain, shortness of breath, dizziness, visual changes, neck or back pain.   Loss of Consciousness Associated symptoms: nausea and vomiting   Diarrhea Associated symptoms: vomiting   Abdominal Pain Associated symptoms: diarrhea, nausea and vomiting     Past Medical History:  Diagnosis Date   Anemia    Asthma    Delivery of twins, both live 12/29/2021   Preterm premature rupture of membranes (PPROM) delivered, current hospitalization 12/27/2021   Preterm premature rupture of membranes (PPROM) with onset of labor after 24 hours of rupture in second trimester, antepartum 11/22/2021   Completed antibiotics and BMZ.  Pt left hospital AMA at 29 weeks due to childcare issues    Patient Active Problem List   Diagnosis Date Noted   Postpartum care following vaginal delivery 03/22/2022   Contraception management 03/22/2022   Syncope 08/11/2021   Moderate persistent asthma 03/25/2018   Tobacco  use disorder 03/25/2018    Past Surgical History:  Procedure Laterality Date   NO PAST SURGERIES      OB History     Gravida  3   Para  3   Term  2   Preterm  1   AB  0   Living  4      SAB  0   IAB  0   Ectopic  0   Multiple  1   Live Births  4            Home Medications    Prior to Admission medications   Medication Sig Start Date End Date Taking? Authorizing Provider  acetaminophen (TYLENOL) 500 MG tablet Take 1,000 mg by mouth every 6 (six) hours as needed for moderate pain.    [provider]  albuterol (VENTOLIN HFA) 108 (90 Base) MCG/ACT inhaler Inhale 1-2 puffs into the lungs every 6 (six) hours as needed for wheezing or shortness of breath. 12/30/19   Henderly, Britni A, PA-C  cyclobenzaprine (FLEXERIL) 10 MG tablet Take 1 tablet (10 mg total) by mouth 3 (three) times daily as needed for muscle spasms. 12/30/21   Anyanwu, Jethro Bastos, MD  ibuprofen (ADVIL) 600 MG tablet Take 1 tablet (600 mg total) by mouth every 6 (six) hours as needed for headache, mild pain or fever. 12/30/21   Anyanwu, Jethro Bastos, MD  medroxyPROGESTERone (DEPO-PROVERA) 150 MG/ML injection Inject 1 mL (150 mg  total) into the muscle every 3 (three) months. 03/22/22   Hermina Staggers, MD  metroNIDAZOLE (FLAGYL) 500 MG tablet Take 1 tablet (500 mg total) by mouth 2 (two) times daily. 03/12/22   Gerrit Heck, CNM  Prenatal Vit-Fe Fumarate-FA (PRENATAL VITAMIN PO) Take by mouth daily.    [provider]  ipratropium (ATROVENT) 0.03 % nasal spray Place 2 sprays into both nostrils 2 (two) times daily. Patient not taking: Reported on 07/31/2018 06/21/18 11/13/18  Bing Neighbors, FNP    Family History Family History  Problem Relation Age of Onset   Healthy Mother    Healthy Father    Pulmonary fibrosis Maternal Grandmother     Social History Social History   Tobacco Use   Smoking status: Never   Smokeless tobacco: Never   Tobacco comments:    rolls marijuana in with  tobacco  Vaping Use   Vaping Use: Former   Substances: Nicotine  Substance Use Topics   Alcohol use: Not Currently   Drug use: Not Currently    Types: Marijuana    Comment: last smoked marijuana one year ago as of 07/16/2021     Allergies   Patient has no known allergies.   Review of Systems Review of Systems  Cardiovascular:  Positive for syncope.  Gastrointestinal:  Positive for diarrhea, nausea and vomiting.  Neurological:  Positive for syncope.     Physical Exam Triage Vital Signs ED Triage Vitals  Enc Vitals Group     BP 04/06/22 1742 106/66     Pulse Rate 04/06/22 1742 82     Resp 04/06/22 1742 16     Temp 04/06/22 1742 98.7 F (37.1 C)     Temp Source 04/06/22 1739 Oral     SpO2 04/06/22 1742 98 %     Weight --      Height --      Head Circumference --      Peak Flow --      Pain Score 04/06/22 1737 3     Pain Loc --      Pain Edu? --      Excl. in [place]? --    No data found.  Updated Vital Signs BP 106/66 (BP Location: Left Arm)   Pulse 82   Temp 98.7 F (37.1 C) (Oral)   Resp 16   LMP  (LMP Unknown) Comment: the patient states she gave birth about 3 months ago and has began depo  SpO2 98%   Visual Acuity Right Eye Distance:   Left Eye Distance:   Bilateral Distance:    Right Eye Near:   Left Eye Near:    Bilateral Near:     Physical Exam Vitals and nursing note reviewed.  Constitutional:      General: She is not in acute distress.    Appearance: Normal appearance. She is well-developed. She is not ill-appearing.  HENT:     Head: Normocephalic and atraumatic.     Right Ear: Tympanic membrane normal. No hemotympanum.     Left Ear: Tympanic membrane normal. No hemotympanum.     Nose: Nose normal.  Eyes:     Pupils: Pupils are equal, round, and reactive to light.  Cardiovascular:     Rate and Rhythm: Normal rate and regular rhythm.     Heart sounds: Normal heart sounds. No murmur heard. Pulmonary:     Effort: Pulmonary effort is normal.      Breath sounds: Normal breath sounds.  Abdominal:  General: Abdomen is flat. Bowel sounds are normal. There is no distension.     Palpations: Abdomen is soft.     Tenderness: There is no abdominal tenderness. There is no guarding.  Musculoskeletal:     Cervical back: Normal range of motion and neck supple.  Skin:    General: Skin is warm and dry.  Neurological:     General: No focal deficit present.     Mental Status: She is alert and oriented to person, place, and time.     GCS: GCS eye subscore is 4. GCS verbal subscore is 5. GCS motor subscore is 6.  Psychiatric:        Mood and Affect: Mood normal.        Behavior: Behavior normal.      UC Treatments / Results  Labs (all labs ordered are listed, but only abnormal results are displayed) Labs Reviewed  POCT URINE PREGNANCY    EKG   Radiology No results found.  Procedures Procedures (including critical care time)  Medications Ordered in UC Medications - No data to display  Initial Impression / Assessment and Plan / UC Course  I have reviewed the triage vital signs and the nursing notes.  Pertinent labs & imaging results that were available during my care of the patient were reviewed by me and considered in my medical decision making (see chart for details).     Reviewed exam and symptoms with patient.  Negative urine pregnancy in clinic.  Discussed likely vasovagal syncope yesterday.  No red flags on exam and do not feel ER evaluation needed at this time.  Advised she go to the ER ASAP for any additional syncopal episodes.   She states her GI symptoms have nearly resolved.  Gastroenteritis versus food poisoning? Continue hydration.  Brat diet reviewed. Follow-up with PCP in 2 to 3 days for recheck Strict ER precautions reviewed and patient verbalized understanding Final Clinical Impressions(s) / UC Diagnoses   Final diagnoses:  None   Discharge Instructions   None    ED Prescriptions   None     PDMP not reviewed this encounter.   Radford Pax, NP 04/06/22 1821    Radford Pax, NP 04/06/22 Rickey Primus

## 2022-04-06 NOTE — Discharge Instructions (Signed)
Please go to the emergency room ASAP/call 911 if you pass out again Follow-up with your PCP in 1 to 2 days for recheck and further evaluation

## 2022-04-25 DIAGNOSIS — Z419 Encounter for procedure for purposes other than remedying health state, unspecified: Secondary | ICD-10-CM | POA: Diagnosis not present

## 2022-05-26 DIAGNOSIS — Z419 Encounter for procedure for purposes other than remedying health state, unspecified: Secondary | ICD-10-CM | POA: Diagnosis not present

## 2022-06-15 ENCOUNTER — Ambulatory Visit (INDEPENDENT_AMBULATORY_CARE_PROVIDER_SITE_OTHER): Payer: Medicaid Other

## 2022-06-15 DIAGNOSIS — Z3042 Encounter for surveillance of injectable contraceptive: Secondary | ICD-10-CM

## 2022-06-15 MED ORDER — MEDROXYPROGESTERONE ACETATE 150 MG/ML IM SUSP
150.0000 mg | INTRAMUSCULAR | Status: AC
  Administered 2022-06-15: 150 mg via INTRAMUSCULAR

## 2022-06-15 NOTE — Progress Notes (Signed)
Pt is in the office for depo, administered in [place] and pt tolerated well. Next due May 9-23. .. Administrations This Visit     medroxyPROGESTERone (DEPO-PROVERA) injection 150 mg     Admin Date 06/15/2022 Action Given Dose 150 mg Route Intramuscular Administered By Hinton Lovely, RN

## 2022-06-24 ENCOUNTER — Ambulatory Visit
Admission: EM | Admit: 2022-06-24 | Discharge: 2022-06-24 | Disposition: A | Discharge status: Home / Self Care | Payer: Medicaid Other

## 2022-06-24 DIAGNOSIS — R112 Nausea with vomiting, unspecified: Secondary | ICD-10-CM | POA: Diagnosis not present

## 2022-06-24 DIAGNOSIS — Z419 Encounter for procedure for purposes other than remedying health state, unspecified: Secondary | ICD-10-CM | POA: Diagnosis not present

## 2022-06-24 NOTE — ED Provider Notes (Signed)
[facility]    CSN: [id number] Arrival date & time: 06/24/22  [id number]      History   Chief Complaint No chief complaint on file.   HPI Carol Hendrix is a 22 y.o. female.   HPI  She is in today with nausea and vomiting with abdominal pain for 2 days. She reports that her boyfriend and 2 older children have had similar symptoms. She has taken APAP and Dramamine which was effective. Denies fever, chills, headache, dizziness, visual changes, shortness of breath, dyspnea on exertion, chest pain, or any edema. She did a home COVID test which was negative. She does not feel like she needs any additional evaluation. 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   Past Medical History:  Diagnosis Date   Anemia    Asthma    Delivery of twins, both live 12/29/2021   Preterm premature rupture of membranes (PPROM) delivered, current hospitalization 12/27/2021   Preterm premature rupture of membranes (PPROM) with onset of labor after 24 hours of rupture in second trimester, antepartum 11/22/2021   Completed antibiotics and BMZ.  Pt left hospital AMA at 29 weeks due to childcare issues    Patient Active Problem List   Diagnosis Date Noted   Postpartum care following vaginal delivery 03/22/2022   Contraception management 03/22/2022   Syncope 08/11/2021   Moderate persistent asthma 03/25/2018   Tobacco use disorder 03/25/2018    Past Surgical History:  Procedure Laterality Date   NO PAST SURGERIES      OB History     Gravida  3   Para  3   Term  2   Preterm  1   AB  0   Living  4      SAB  0   IAB  0   Ectopic  0   Multiple  1   Live Births  4            Home Medications    Prior to Admission medications   Medication Sig Start Date End Date Taking? Authorizing Provider  acetaminophen (TYLENOL) 500 MG tablet Take 1,000 mg by mouth every 6 (six) hours as needed for moderate pain. Patient not taking: Reported on 06/15/2022    [provider]  albuterol (VENTOLIN HFA) 108 (90 Base) MCG/ACT inhaler Inhale 1-2 puffs into the lungs every 6 (six) hours as needed for wheezing or shortness of breath. Patient not taking: Reported on 06/15/2022 12/30/19   Henderly, Britni A, PA-C  cyclobenzaprine (FLEXERIL) 10 MG tablet Take 1 tablet (10 mg total) by mouth 3 (three) times daily as needed for muscle spasms. Patient not taking: Reported on 06/15/2022 12/30/21   Osborne Oman, MD  ibuprofen (ADVIL) 600 MG tablet Take 1 tablet (600 mg total) by mouth every 6 (six) hours as needed for headache, mild pain or fever. Patient not taking: Reported on 06/15/2022 12/30/21   Osborne Oman, MD  medroxyPROGESTERone (DEPO-PROVERA) 150 MG/ML injection Inject 1 mL (150 mg total) into the muscle every 3 (three) months. 03/22/22   Chancy Milroy, MD  ipratropium (ATROVENT) 0.03 % nasal spray Place 2 sprays into both nostrils 2 (two) times daily. Patient not taking: Reported on 07/31/2018 06/21/18 11/13/18  Scot Jun, NP    Family History Family History  Problem Relation Age of Onset   Healthy Mother    Healthy Father    Pulmonary fibrosis Maternal Grandmother     Social History Social History  Tobacco Use   Smoking status: Never   Smokeless tobacco: Never   Tobacco comments:    rolls marijuana in with tobacco  Vaping Use   Vaping Use: Former   Substances: Nicotine  Substance Use Topics   Alcohol use: Not Currently   Drug use: Not Currently    Types: Marijuana    Comment: last smoked marijuana one year ago as of 07/16/2021     Allergies   Patient has no known allergies.   Review of Systems Review of Systems   Physical Exam Triage Vital Signs ED Triage Vitals  Enc Vitals Group     BP 06/24/22 0957 115/71     Pulse Rate 06/24/22 0957 67     Resp 06/24/22 0957 20     Temp 06/24/22 0957 98 F (36.7 C)     Temp Source 06/24/22 0957 Oral     SpO2 06/24/22 0957 97 %     Weight --      Height --      Head  Circumference --      Peak Flow --      Pain Score 06/24/22 0958 0     Pain Loc --      Pain Edu? --      Excl. in [place]? --    No data found.  Updated Vital Signs BP 115/71 (BP Location: Left Arm)   Pulse 67   Temp 98 F (36.7 C) (Oral)   Resp 20   SpO2 97%   Visual Acuity Right Eye Distance:   Left Eye Distance:   Bilateral Distance:    Right Eye Near:   Left Eye Near:    Bilateral Near:     Physical Exam Constitutional:      General: She is not in acute distress.    Appearance: She is normal weight. She is not ill-appearing, toxic-appearing or diaphoretic.  HENT:     Head: Normocephalic and atraumatic.     Right Ear: Tympanic membrane normal.     Left Ear: Tympanic membrane normal.     Nose: Nose normal.     Mouth/Throat:     Mouth: Mucous membranes are dry.     Pharynx: No oropharyngeal exudate or posterior oropharyngeal erythema.     Comments: Lip dehydration visible  Eyes:     Pupils: Pupils are equal, round, and reactive to light.  Cardiovascular:     Rate and Rhythm: Normal rate and regular rhythm.     Pulses: Normal pulses.     Heart sounds: Normal heart sounds.  Pulmonary:     Effort: Pulmonary effort is normal.     Breath sounds: Normal breath sounds.  Musculoskeletal:        General: Normal range of motion.     Cervical back: Normal range of motion.  Skin:    General: Skin is warm.     Capillary Refill: Capillary refill takes less than 2 seconds.  Neurological:     General: No focal deficit present.     Mental Status: She is alert and oriented to person, place, and time.  Psychiatric:        Mood and Affect: Mood normal.      UC Treatments / Results  Labs (all labs ordered are listed, but only abnormal results are displayed) Labs Reviewed - No data to display  EKG   Radiology No results found.  Procedures Procedures (including critical care time)  Medications Ordered in UC Medications - No data to display  Initial Impression /  Assessment and Plan / UC Course  I have reviewed the triage vital signs and the nursing notes.  Pertinent labs & imaging results that were available during my care of the patient were reviewed by me and considered in my medical decision making (see chart for details).     Flu-like symptoms Final Clinical Impressions(s) / UC Diagnoses   Final diagnoses:  Nausea and vomiting, unspecified vomiting type     Discharge Instructions      You had a stomach virus. Your symptoms are improving. Continue to treat symptoms as needed.   If symptoms return or worsen follow up here with PCP for further evaluation.   Enjoy your family.      ED Prescriptions   None    PDMP not reviewed this encounter.   Dionisio David [place], NP 06/24/22 1030

## 2022-06-24 NOTE — ED Triage Notes (Signed)
Pt reports she had N/V and abdominal pain, congestion x 2 days. Took home covid test and it was neg. Pt needs a work note.

## 2022-06-24 NOTE — Discharge Instructions (Addendum)
You had a stomach virus. Your symptoms are improving. Continue to treat symptoms as needed.   If symptoms return or worsen follow up here with PCP for further evaluation.   Enjoy your family.

## 2022-07-01 DIAGNOSIS — Z743 Need for continuous supervision: Secondary | ICD-10-CM | POA: Diagnosis not present

## 2022-07-01 DIAGNOSIS — R0989 Other specified symptoms and signs involving the circulatory and respiratory systems: Secondary | ICD-10-CM | POA: Diagnosis not present

## 2022-07-01 DIAGNOSIS — R062 Wheezing: Secondary | ICD-10-CM | POA: Diagnosis not present

## 2022-07-01 DIAGNOSIS — R000 Tachycardia, unspecified: Secondary | ICD-10-CM | POA: Diagnosis not present

## 2022-07-05 ENCOUNTER — Telehealth: Payer: Self-pay

## 2022-07-05 NOTE — Telephone Encounter (Signed)
..   Medicaid Managed Care   Unsuccessful Outreach Note  07/05/2022 Name: Carol Hendrix MRN: [id number] DOB: 2001-02-04  Referred by: Scot Jun, NP Reason for referral : Appointment (I called the patient today to offer her the services of the Managed Medicaid Team. Her number was not in order and I was not able to leave a message.)   An unsuccessful telephone outreach was attempted today. The patient was referred to the case management team for assistance with care management and care coordination.   Follow Up Plan: A HIPAA compliant phone message was left for the patient providing contact information and requesting a return call.  The care management team will reach out to the patient again over the next 7 days.   [place]

## 2022-07-14 ENCOUNTER — Telehealth: Payer: Self-pay

## 2022-07-14 NOTE — Telephone Encounter (Signed)
..   Medicaid Managed Care   Unsuccessful Outreach Note  07/14/2022 Name: Carol Hendrix MRN: [id number] DOB: 19-Jan-2001  Referred by: Scot Jun, NP Reason for referral : Appointment (I called to schedule the patient for a phone appt with the MM Team. Her number was not in order./)   A second unsuccessful telephone outreach was attempted today. The patient was referred to the case management team for assistance with care management and care coordination.   Follow Up Plan: The care management team will reach out to the patient again over the next 14 days.   [place]

## 2022-07-25 DIAGNOSIS — Z419 Encounter for procedure for purposes other than remedying health state, unspecified: Secondary | ICD-10-CM | POA: Diagnosis not present

## 2022-08-24 DIAGNOSIS — Z419 Encounter for procedure for purposes other than remedying health state, unspecified: Secondary | ICD-10-CM | POA: Diagnosis not present

## 2022-09-06 ENCOUNTER — Other Ambulatory Visit: Payer: Self-pay | Admitting: Obstetrics and Gynecology

## 2022-09-06 ENCOUNTER — Other Ambulatory Visit: Payer: Self-pay

## 2022-09-06 ENCOUNTER — Ambulatory Visit: Payer: Medicaid Other | Admitting: Emergency Medicine

## 2022-09-06 DIAGNOSIS — Z30013 Encounter for initial prescription of injectable contraceptive: Secondary | ICD-10-CM

## 2022-09-06 MED ORDER — MEDROXYPROGESTERONE ACETATE 150 MG/ML IM SUSP: 150.0000 mg | INTRAMUSCULAR | 2 refills | Status: DC

## 2022-09-06 NOTE — Progress Notes (Signed)
Date last pap: 08/11/2021. Last Depo-Provera: 06/15/2022. Side Effects if any: None. Serum HCG indicated? N/A. Depo-Provera 150 mg IM given by: Resa Miner, RN into RD. Next appointment due Jul 30-Aug 13.

## 2022-09-24 DIAGNOSIS — Z419 Encounter for procedure for purposes other than remedying health state, unspecified: Secondary | ICD-10-CM | POA: Diagnosis not present

## 2022-10-24 DIAGNOSIS — Z419 Encounter for procedure for purposes other than remedying health state, unspecified: Secondary | ICD-10-CM | POA: Diagnosis not present

## 2022-11-11 ENCOUNTER — Ambulatory Visit (HOSPITAL_COMMUNITY)
Admission: EM | Admit: 2022-11-11 | Discharge: 2022-11-11 | Disposition: A | Discharge status: Home / Self Care | Payer: Medicaid Other | Source: Home / Self Care

## 2022-11-11 ENCOUNTER — Encounter (HOSPITAL_COMMUNITY): Payer: Self-pay | Admitting: *Deleted

## 2022-11-11 DIAGNOSIS — J4521 Mild intermittent asthma with (acute) exacerbation: Secondary | ICD-10-CM

## 2022-11-11 MED ORDER — METHYLPREDNISOLONE ACETATE 40 MG/ML IJ SUSP
40.0000 mg | Freq: Once | INTRAMUSCULAR | Status: AC
  Administered 2022-11-11: 40 mg via INTRAMUSCULAR

## 2022-11-11 MED ORDER — ALBUTEROL SULFATE (2.5 MG/3ML) 0.083% IN NEBU
2.5000 mg | INHALATION_SOLUTION | Freq: Once | RESPIRATORY_TRACT | Status: AC
  Administered 2022-11-11: 2.5 mg via RESPIRATORY_TRACT

## 2022-11-11 MED ORDER — MONTELUKAST SODIUM 10 MG PO TABS: 10.0000 mg | ORAL_TABLET | Freq: Every day | ORAL | 0 refills | Status: DC

## 2022-11-11 MED ORDER — ALBUTEROL SULFATE HFA 108 (90 BASE) MCG/ACT IN AERS: 1.0000 | INHALATION_SPRAY | Freq: Four times a day (QID) | RESPIRATORY_TRACT | 0 refills | Status: DC | PRN

## 2022-11-11 NOTE — ED Triage Notes (Signed)
Pt states she has been SOB the last three days. She used her child's albuterol neb last night but states it didn't work well. She states she did have duo neb for herself but lost it, she also lost her albuterol MDI.

## 2022-11-11 NOTE — Discharge Instructions (Addendum)
We gave you a breathing treat and steroid shot today to help with lung inflammation  Please use the albuterol inhaler every 4-6 hours as needed for wheezing or shortness of breath.  Start taking Singulair at nighttime.  Avoid inhaled allergens including smoking cigarettes.  Recommend follow-up with PCP and/or allergist if symptoms persist or worsen despite treatment.

## 2022-11-11 NOTE — ED Provider Notes (Signed)
[facility]    CSN: [id number] Arrival date & time: 11/11/22  1512      History   Chief Complaint Chief Complaint  Patient presents with   Shortness of Breath    HPI Carol Hendrix is a 22 y.o. female.   Patient presents today with 3-day history of shortness of breath, cough, and wheezing.  She also endorses chest tightness and pain in her rib cage when she coughs.  No fever, body aches or chills, runny or stuffy nose, sore throat, headache, ear pain, abdominal pain, nausea/vomiting, or diarrhea.  No loss of taste or smell or new rash or excessive fatigue.  Reports she is not sleeping well at night because of her breathing.  She has used her child's nebulizer machine which helped minimally with symptoms.  She reports history of asthma and lost her inhaler a couple of months ago.  Last use of her own inhaler was a couple of months ago.  She reports history of mild intermittent asthma, used to needed daily inhaler when she was young but has since outgrown that.  Reports her dad takes Singulair and was wondering if she needs that.  Does not have a PCP or Allergist.   Patient is confident she is not pregnant-she receives Depo Provera injections and last was in the past month.    Past Medical History:  Diagnosis Date   Anemia    Asthma    Delivery of twins, both live 12/29/2021   Preterm premature rupture of membranes (PPROM) delivered, current hospitalization 12/27/2021   Preterm premature rupture of membranes (PPROM) with onset of labor after 24 hours of rupture in second trimester, antepartum 11/22/2021   Completed antibiotics and BMZ.  Pt left hospital AMA at 29 weeks due to childcare issues    Patient Active Problem List   Diagnosis Date Noted   Postpartum care following vaginal delivery 03/22/2022   Contraception management 03/22/2022   Syncope 08/11/2021   Moderate persistent asthma 03/25/2018   Tobacco use disorder 03/25/2018    Past Surgical History:   Procedure Laterality Date   NO PAST SURGERIES      OB History     Gravida  3   Para  3   Term  2   Preterm  1   AB  0   Living  4      SAB  0   IAB  0   Ectopic  0   Multiple  1   Live Births  4            Home Medications    Prior to Admission medications   Medication Sig Start Date End Date Taking? Authorizing Provider  montelukast (SINGULAIR) 10 MG tablet Take 1 tablet (10 mg total) by mouth at bedtime. 11/11/22  Yes Valentino Nose, NP  acetaminophen (TYLENOL) 500 MG tablet Take 1,000 mg by mouth every 6 (six) hours as needed for moderate pain. Patient not taking: Reported on 06/15/2022    [provider]  albuterol (VENTOLIN HFA) 108 (90 Base) MCG/ACT inhaler Inhale 1-2 puffs into the lungs every 6 (six) hours as needed for wheezing or shortness of breath. 11/11/22   Valentino Nose, NP  cyclobenzaprine (FLEXERIL) 10 MG tablet Take 1 tablet (10 mg total) by mouth 3 (three) times daily as needed for muscle spasms. Patient not taking: Reported on 06/15/2022 12/30/21   Tereso Newcomer, MD  ibuprofen (ADVIL) 600 MG tablet Take 1 tablet (600 mg total) by  mouth every 6 (six) hours as needed for headache, mild pain or fever. Patient not taking: Reported on 06/15/2022 12/30/21   Tereso Newcomer, MD  medroxyPROGESTERone (DEPO-PROVERA) 150 MG/ML injection Inject 1 mL (150 mg total) into the muscle every 3 (three) months. 09/06/22   Anyanwu, Jethro Bastos, MD  medroxyPROGESTERone Acetate 150 MG/ML SUSY ADMINISTER 1 ML(150 MG) IN THE MUSCLE EVERY 3 MONTHS 09/06/22   Brock Bad, MD  ipratropium (ATROVENT) 0.03 % nasal spray Place 2 sprays into both nostrils 2 (two) times daily. Patient not taking: Reported on 07/31/2018 06/21/18 11/13/18  Bing Neighbors, NP    Family History Family History  Problem Relation Age of Onset   Healthy Mother    Healthy Father    Pulmonary fibrosis Maternal Grandmother     Social History Social History   Tobacco Use    Smoking status: Never   Smokeless tobacco: Never   Tobacco comments:    rolls marijuana in with tobacco  Vaping Use   Vaping status: Former   Substances: Nicotine  Substance Use Topics   Alcohol use: Not Currently   Drug use: Not Currently    Types: Marijuana    Comment: last smoked marijuana one year ago as of 07/16/2021     Allergies   Patient has no known allergies.   Review of Systems Review of Systems Per HPI  Physical Exam Triage Vital Signs ED Triage Vitals  Encounter Vitals Group     BP 11/11/22 1532 117/75     Systolic BP Percentile --      Diastolic BP Percentile --      Pulse Rate 11/11/22 1532 79     Resp 11/11/22 1532 20     Temp 11/11/22 1532 98.5 F (36.9 C)     Temp Source 11/11/22 1532 Oral     SpO2 11/11/22 1532 94 %     Weight --      Height --      Head Circumference --      Peak Flow --      Pain Score 11/11/22 1530 0     Pain Loc --      Pain Education --      Exclude from Growth Chart --    No data found.  Updated Vital Signs BP 117/75 (BP Location: Right Arm)   Pulse 79   Temp 98.5 F (36.9 C) (Oral)   Resp 20   SpO2 94%   Visual Acuity Right Eye Distance:   Left Eye Distance:   Bilateral Distance:    Right Eye Near:   Left Eye Near:    Bilateral Near:     Physical Exam Vitals and nursing note reviewed.  Constitutional:      General: She is not in acute distress.    Appearance: Normal appearance. She is not ill-appearing or toxic-appearing.  HENT:     Head: Normocephalic and atraumatic.     Right Ear: Tympanic membrane, ear canal and external ear normal.     Left Ear: Tympanic membrane, ear canal and external ear normal.     Nose: Congestion and rhinorrhea present.     Mouth/Throat:     Mouth: Mucous membranes are moist.     Pharynx: Oropharynx is clear. Posterior oropharyngeal erythema present. No oropharyngeal exudate.  Eyes:     General: No scleral icterus.    Extraocular Movements: Extraocular movements intact.   Cardiovascular:     Rate and Rhythm: Normal rate and regular rhythm.  Heart sounds: No murmur heard. Pulmonary:     Effort: Pulmonary effort is normal. No tachypnea, accessory muscle usage or respiratory distress.     Breath sounds: Wheezing present. No decreased breath sounds, rhonchi or rales.  Abdominal:     General: Abdomen is flat. Bowel sounds are normal. There is no distension.     Palpations: Abdomen is soft.  Musculoskeletal:     Cervical back: Normal range of motion and neck supple.  Lymphadenopathy:     Cervical: No cervical adenopathy.  Skin:    General: Skin is warm and dry.     Coloration: Skin is not jaundiced or pale.     Findings: No erythema or rash.  Neurological:     Mental Status: She is alert and oriented to person, place, and time.     Motor: No weakness.  Psychiatric:        Behavior: Behavior is cooperative.      UC Treatments / Results  Labs (all labs ordered are listed, but only abnormal results are displayed) Labs Reviewed - No data to display  EKG   Radiology No results found.  Procedures Procedures (including critical care time)  Medications Ordered in UC Medications  albuterol (PROVENTIL) (2.5 MG/3ML) 0.083% nebulizer solution 2.5 mg (2.5 mg Nebulization Given 11/11/22 1602)  methylPREDNISolone acetate (DEPO-MEDROL) injection 40 mg (40 mg Intramuscular Given 11/11/22 1603)    Initial Impression / Assessment and Plan / UC Course  I have reviewed the triage vital signs and the nursing notes.  Pertinent labs & imaging results that were available during my care of the patient were reviewed by me and considered in my medical decision making (see chart for details).   Patient is well-appearing, normotensive, afebrile, not tachycardic, not tachypneic, oxygenating well on room air.    1. Mild intermittent asthma with exacerbation Albuterol nebulizer given in urgent care as well as Depo-Medrol 40 mg IM After breathing treatment and  corticosteroid injection, lung sounds clear to auscultation bilaterally Recommended use of albuterol inhaler every 4-6 hours as needed at home Avoid allergens/inhaled irritants Start Singulair for exacerbation prevention Oral corticosteroids deferred given great response to Depo-Medrol IM and nebulizer Strict ER and return precautions discussed Contact information given for allergist and PCP to establish care  The patient was given the opportunity to ask questions.  All questions answered to their satisfaction.  The patient is in agreement to this plan.    Final Clinical Impressions(s) / UC Diagnoses   Final diagnoses:  Mild intermittent asthma with exacerbation     Discharge Instructions      We gave you a breathing treat and steroid shot today to help with lung inflammation  Please use the albuterol inhaler every 4-6 hours as needed for wheezing or shortness of breath.  Start taking Singulair at nighttime.  Avoid inhaled allergens including smoking cigarettes.  Recommend follow-up with PCP and/or allergist if symptoms persist or worsen despite treatment.     ED Prescriptions     Medication Sig Dispense Auth. Provider   albuterol (VENTOLIN HFA) 108 (90 Base) MCG/ACT inhaler Inhale 1-2 puffs into the lungs every 6 (six) hours as needed for wheezing or shortness of breath. 8 g Cathlean Marseilles A, NP   montelukast (SINGULAIR) 10 MG tablet Take 1 tablet (10 mg total) by mouth at bedtime. 30 tablet Valentino Nose, NP      PDMP not reviewed this encounter.   Valentino Nose, NP 11/11/22 1655

## 2022-11-12 ENCOUNTER — Other Ambulatory Visit: Payer: Self-pay

## 2022-11-12 ENCOUNTER — Emergency Department (HOSPITAL_COMMUNITY): Payer: Medicaid Other

## 2022-11-12 ENCOUNTER — Encounter (HOSPITAL_COMMUNITY): Payer: Self-pay

## 2022-11-12 ENCOUNTER — Emergency Department (HOSPITAL_COMMUNITY)
Admission: EM | Admit: 2022-11-12 | Discharge: 2022-11-12 | Disposition: A | Discharge status: Home / Self Care | Payer: Medicaid Other | Attending: Emergency Medicine | Admitting: Emergency Medicine

## 2022-11-12 DIAGNOSIS — J45901 Unspecified asthma with (acute) exacerbation: Secondary | ICD-10-CM | POA: Diagnosis not present

## 2022-11-12 DIAGNOSIS — R0602 Shortness of breath: Secondary | ICD-10-CM | POA: Diagnosis not present

## 2022-11-12 DIAGNOSIS — Z20822 Contact with and (suspected) exposure to covid-19: Secondary | ICD-10-CM | POA: Insufficient documentation

## 2022-11-12 DIAGNOSIS — R519 Headache, unspecified: Secondary | ICD-10-CM | POA: Insufficient documentation

## 2022-11-12 DIAGNOSIS — R0600 Dyspnea, unspecified: Secondary | ICD-10-CM | POA: Diagnosis present

## 2022-11-12 DIAGNOSIS — R5381 Other malaise: Secondary | ICD-10-CM | POA: Diagnosis not present

## 2022-11-12 LAB — BASIC METABOLIC PANEL
Anion gap: 15 (ref 5–15)
BUN: 10 mg/dL (ref 6–20)
CO2: 23 mmol/L (ref 22–32)
Calcium: 9.6 mg/dL (ref 8.9–10.3)
Chloride: 100 mmol/L (ref 98–111)
Creatinine, Ser: 0.78 mg/dL (ref 0.44–1.00)
GFR, Estimated: >60 mL/min (ref >60)
Glucose, Bld: 85 mg/dL (ref 70–99)
Potassium: 3.8 mmol/L (ref 3.5–5.1)
Sodium: 138 mmol/L (ref 135–145)

## 2022-11-12 LAB — RESP PANEL BY RT-PCR (RSV, FLU A&B, COVID)  RVPGX2
Influenza A by PCR: NEGATIVE
Influenza B by PCR: NEGATIVE
Resp Syncytial Virus by PCR: NEGATIVE
SARS Coronavirus 2 by RT PCR: NEGATIVE

## 2022-11-12 LAB — MAGNESIUM: Magnesium: 1.9 mg/dL (ref 1.7–2.4)

## 2022-11-12 MED ORDER — MAGNESIUM SULFATE 2 GM/50ML IV SOLN
2.0000 g | Freq: Once | INTRAVENOUS | Status: AC
  Administered 2022-11-12: 2 g via INTRAVENOUS
  Filled 2022-11-12: qty 50

## 2022-11-12 MED ORDER — IPRATROPIUM-ALBUTEROL 0.5-2.5 (3) MG/3ML IN SOLN
3.0000 mL | Freq: Once | RESPIRATORY_TRACT | Status: AC
  Administered 2022-11-12: 3 mL via RESPIRATORY_TRACT
  Filled 2022-11-12: qty 3

## 2022-11-12 MED ORDER — ALBUTEROL SULFATE HFA 108 (90 BASE) MCG/ACT IN AERS
2.0000 | INHALATION_SPRAY | RESPIRATORY_TRACT | Status: DC | PRN
  Administered 2022-11-12: 2 via RESPIRATORY_TRACT
  Filled 2022-11-12: qty 6.7

## 2022-11-12 MED ORDER — ALBUTEROL SULFATE (2.5 MG/3ML) 0.083% IN NEBU
10.0000 mg | INHALATION_SOLUTION | Freq: Once | RESPIRATORY_TRACT | Status: AC
  Administered 2022-11-12: 10 mg via RESPIRATORY_TRACT
  Filled 2022-11-12: qty 12

## 2022-11-12 NOTE — Discharge Instructions (Signed)
You are seen today for an asthma exacerbation.  While you were here we got pictures, performed a physical exam, gave you medications, and monitored your vital signs.  These were all reassuring and there is no indication for any further testing or workup necessary in the emergency department at this time.  Please continue to use the albuterol inhaler that was sitting at home with, recommend that you take 2 puffs every 4 hours for first 24 hours, and then you can use it as needed after that.  Please follow-up with your primary care doctor within a week.  And return to the emergency department if you have any new or worsening concerns including any difficulty breathing, shortness of breath, or chest pain.

## 2022-11-12 NOTE — ED Triage Notes (Signed)
Pt arrived POV from home stating she has a hx of asthma but has been short of breathe for a couple days, had a cough and a runny nose. Pt states normally she takes a couple breathing treatments and she is fine. Pt states she went to urgent care yesterday and got a steroid shot and breathing treatments but it only helped for a couple of hours.

## 2022-11-12 NOTE — ED Provider Notes (Signed)
[facility] AT [facility] Provider Note  MDM   HPI/ROS:  Carol Hendrix is a 22 y.o. female with a medical history as below significant for asthma who presents for the last couple days she has had nasal congestion, general malaise, cough and difficulty breathing.  Does report that she is out of her albuterol inhaler at home, and has not been able to use anything.  She did go to an urgent care yesterday where she got a steroid injection and a breathing treatment and was discharged with a prescription for an albuterol inhaler, however she has not picked it up yet.  When she woke up this morning she was still having difficulty breathing and decided to come to the emergency department for further evaluation.  Denies any fever, chills, nausea, vomiting.   Physical exam is notable for: - Bilateral inspiratory expiratory wheezing in upper and lower lung fields  On my initial evaluation, patient is:  -Vital signs stable. Patient afebrile, hemodynamically stable, and non-toxic appearing. -Additional history obtained from chart review  This patient's current presentation, including their history and physical exam, is most consistent with asthma exacerbation in the setting of likely viral URI.  Additionally complicating is that the patient has not had any access to an albuterol inhaler at home.  Overall she is well-appearing despite having wheezing in all lung fields.  She does not look particularly or hungry.  Low suspicion for any cardiac origin and history and physical exam supports this.    Plan at this time: Chest x-ray, continuous neb, mag, COVID/flu/RSV, serial reevaluation   Interpretations, interventions, and the patient's course of care are documented below.    Clinical Course as of 11/12/22 1858  Sat Nov 12, 2022     4401 Patient reevaluated, she no longer has wheezing and subjectively says she feels better.  Vitals remained stable. [BB]    Clinical Course User  Index [BB] Fayrene Helper, MD [MT] Renaye Rakers Kermit Balo, MD      Disposition:  I discussed the plan for discharge with the patient and/or their surrogate at bedside prior to discharge and they were in agreement with the plan and verbalized understanding of the return precautions provided. All questions answered to the best of my ability. Ultimately, the patient was discharged in stable condition with stable vital signs. I am reassured that they are capable of close follow up and good social support at home.  Discharged with albuterol MDI to go home with.  Clinical Impression:  1. Moderate asthma with exacerbation, unspecified whether persistent     Rx / DC Orders ED Discharge Orders     None       The plan for this patient was discussed with Dr. Renaye Rakers, who voiced agreement and who oversaw evaluation and treatment of this patient.   Clinical Complexity A medically appropriate history, review of systems, and physical exam was performed.  My independent interpretations of EKG, labs, and radiology are documented in the ED course above.   If decision rules were used in this patient's evaluation, they are listed below.   Click here for ABCD2, HEART and other calculatorsREFRESH Note before signing   Patient's presentation is most consistent with acute presentation with potential threat to life or bodily function.  Medical Decision Making Amount and/or Complexity of Data Reviewed Labs: ordered. Radiology: ordered. ECG/medicine tests: ordered.  Risk Prescription drug management.    HPI/ROS      See MDM section for pertinent HPI and ROS. A  complete ROS was performed with pertinent positives/negatives noted above.   Past Medical History:  Diagnosis Date   Anemia    Asthma    Delivery of twins, both live 12/29/2021   Preterm premature rupture of membranes (PPROM) delivered, current hospitalization 12/27/2021   Preterm premature rupture of membranes (PPROM) with onset of  labor after 24 hours of rupture in second trimester, antepartum 11/22/2021   Completed antibiotics and BMZ.  Pt left hospital AMA at 29 weeks due to childcare issues    Past Surgical History:  Procedure Laterality Date   NO PAST SURGERIES        Physical Exam   Vitals:   11/12/22 1444 11/12/22 1448  BP: 122/80   Pulse: 88   Resp: 18   Temp: 98.9 F (37.2 C)   SpO2: 96%   Weight:  81.6 kg  Height:  5\' 5"  (1.651 m)    Physical Exam Vitals and nursing note reviewed.  Constitutional:      General: She is not in acute distress.    Appearance: She is well-developed.  HENT:     Head: Normocephalic and atraumatic.  Eyes:     Conjunctiva/sclera: Conjunctivae normal.  Cardiovascular:     Rate and Rhythm: Normal rate and regular rhythm.     Heart sounds: No murmur heard. Pulmonary:     Effort: Pulmonary effort is normal. Tachypnea present. No accessory muscle usage or respiratory distress.     Breath sounds: No stridor. Examination of the right-upper field reveals wheezing. Examination of the left-upper field reveals wheezing. Examination of the right-middle field reveals wheezing. Examination of the right-lower field reveals wheezing. Examination of the left-lower field reveals wheezing. Wheezing present. No decreased breath sounds, rhonchi or rales.  Abdominal:     Palpations: Abdomen is soft.     Tenderness: There is no abdominal tenderness.  Musculoskeletal:        General: No swelling.     Cervical back: Neck supple.     Right lower leg: No edema.     Left lower leg: No edema.  Skin:    General: Skin is warm and dry.     Capillary Refill: Capillary refill takes less than 2 seconds.  Neurological:     Mental Status: She is alert.  Psychiatric:        Mood and Affect: Mood normal.      Procedures   If procedures were preformed on this patient, they are listed below:  Procedures   Fayrene Helper, MD Emergency Medicine PGY-2   Please note that this documentation  was produced with the assistance of voice-to-text technology and may contain errors.    Fayrene Helper, MD 11/12/22 Avelino Leeds    Terald Sleeper, MD 11/12/22 [phone]

## 2022-11-12 NOTE — ED Notes (Signed)
Patient sitting at end of bed. Had paused pump medications and removed IV. States "I was in the hospital for 3 months once, I know how to work the equipment." Discharge instructions reviewed with patient and left ambulatory.

## 2022-11-14 ENCOUNTER — Encounter (HOSPITAL_COMMUNITY): Payer: Self-pay

## 2022-11-24 DIAGNOSIS — Z419 Encounter for procedure for purposes other than remedying health state, unspecified: Secondary | ICD-10-CM | POA: Diagnosis not present

## 2022-11-25 ENCOUNTER — Ambulatory Visit: Payer: Medicaid Other

## 2022-11-27 ENCOUNTER — Encounter (HOSPITAL_COMMUNITY): Payer: Self-pay

## 2022-11-27 ENCOUNTER — Other Ambulatory Visit: Payer: Self-pay | Admitting: Nurse Practitioner

## 2022-11-27 ENCOUNTER — Emergency Department (HOSPITAL_COMMUNITY)
Admission: EM | Admit: 2022-11-27 | Discharge: 2022-11-28 | Discharge status: Against Medical Advice | Payer: Medicaid Other | Attending: Emergency Medicine | Admitting: Emergency Medicine

## 2022-11-27 DIAGNOSIS — Z5321 Procedure and treatment not carried out due to patient leaving prior to being seen by health care provider: Secondary | ICD-10-CM | POA: Insufficient documentation

## 2022-11-27 DIAGNOSIS — J45909 Unspecified asthma, uncomplicated: Secondary | ICD-10-CM | POA: Insufficient documentation

## 2022-11-27 MED ORDER — ALBUTEROL SULFATE HFA 108 (90 BASE) MCG/ACT IN AERS
2.0000 | INHALATION_SPRAY | RESPIRATORY_TRACT | Status: DC | PRN
  Administered 2022-11-27: 2 via RESPIRATORY_TRACT
  Filled 2022-11-27: qty 6.7

## 2022-11-27 NOTE — ED Triage Notes (Signed)
Pt states that her asthma has been flaring up and she ran out of her rescue inhaler.

## 2022-11-28 NOTE — ED Notes (Signed)
Pt name called to see provider, no response

## 2022-12-01 ENCOUNTER — Emergency Department (HOSPITAL_COMMUNITY): Payer: Medicaid Other

## 2022-12-01 ENCOUNTER — Encounter (HOSPITAL_COMMUNITY): Payer: Self-pay

## 2022-12-01 ENCOUNTER — Emergency Department (HOSPITAL_COMMUNITY)
Admission: EM | Admit: 2022-12-01 | Discharge: 2022-12-01 | Disposition: A | Discharge status: Home / Self Care | Payer: Medicaid Other | Attending: Emergency Medicine | Admitting: Emergency Medicine

## 2022-12-01 DIAGNOSIS — R059 Cough, unspecified: Secondary | ICD-10-CM | POA: Diagnosis not present

## 2022-12-01 DIAGNOSIS — Z1152 Encounter for screening for COVID-19: Secondary | ICD-10-CM | POA: Diagnosis not present

## 2022-12-01 DIAGNOSIS — J069 Acute upper respiratory infection, unspecified: Secondary | ICD-10-CM | POA: Insufficient documentation

## 2022-12-01 DIAGNOSIS — R0602 Shortness of breath: Secondary | ICD-10-CM | POA: Diagnosis not present

## 2022-12-01 DIAGNOSIS — J4521 Mild intermittent asthma with (acute) exacerbation: Secondary | ICD-10-CM | POA: Insufficient documentation

## 2022-12-01 DIAGNOSIS — B9789 Other viral agents as the cause of diseases classified elsewhere: Secondary | ICD-10-CM | POA: Diagnosis not present

## 2022-12-01 LAB — RESP PANEL BY RT-PCR (RSV, FLU A&B, COVID)  RVPGX2
Influenza A by PCR: NEGATIVE
Influenza B by PCR: NEGATIVE
Resp Syncytial Virus by PCR: NEGATIVE
SARS Coronavirus 2 by RT PCR: NEGATIVE

## 2022-12-01 MED ORDER — DEXAMETHASONE SODIUM PHOSPHATE 10 MG/ML IJ SOLN
10.0000 mg | Freq: Once | INTRAMUSCULAR | Status: DC
  Filled 2022-12-01: qty 1

## 2022-12-01 MED ORDER — PREDNISONE 50 MG PO TABS: 50.0000 mg | ORAL_TABLET | Freq: Every day | ORAL | 0 refills | Status: DC

## 2022-12-01 MED ORDER — IPRATROPIUM-ALBUTEROL 0.5-2.5 (3) MG/3ML IN SOLN
3.0000 mL | Freq: Once | RESPIRATORY_TRACT | Status: AC
  Administered 2022-12-01: 3 mL via RESPIRATORY_TRACT
  Filled 2022-12-01: qty 3

## 2022-12-01 MED ORDER — ONDANSETRON 4 MG PO TBDP: 4.0000 mg | ORAL_TABLET | Freq: Three times a day (TID) | ORAL | 0 refills | Status: DC | PRN

## 2022-12-01 MED ORDER — ONDANSETRON 4 MG PO TBDP
4.0000 mg | ORAL_TABLET | Freq: Once | ORAL | Status: AC
  Administered 2022-12-01: 4 mg via ORAL
  Filled 2022-12-01: qty 1

## 2022-12-01 MED ORDER — PREDNISONE 20 MG PO TABS
60.0000 mg | ORAL_TABLET | Freq: Once | ORAL | Status: AC
  Administered 2022-12-01: 60 mg via ORAL
  Filled 2022-12-01: qty 3

## 2022-12-01 MED ORDER — AEROCHAMBER PLUS FLO-VU LARGE MISC: 1.0000 | Freq: Once | Status: DC

## 2022-12-01 NOTE — ED Provider Notes (Signed)
[place] [facility] AT [facility] Provider Note   CSN: [id number] Arrival date & time: 12/01/22  1818     History  Chief Complaint  Patient presents with   Shortness of Breath    Carol Hendrix is a 22 y.o. female.  Pt is a 22 yo female with pmhx significant for asthma.  Pt has had a cough with n/v/d.  Pt has been using her inhaler, but she does not have a spacer.  She said it's not been helping.  No fever.  Mom and sister have covid.       Home Medications Prior to Admission medications   Medication Sig Start Date End Date Taking? Authorizing Provider  predniSONE (DELTASONE) 50 MG tablet Take 1 tablet (50 mg total) by mouth daily with breakfast. 12/01/22  Yes Jacalyn Lefevre, MD  acetaminophen (TYLENOL) 500 MG tablet Take 1,000 mg by mouth every 6 (six) hours as needed for moderate pain. Patient not taking: Reported on 06/15/2022    [provider]  albuterol (VENTOLIN HFA) 108 (90 Base) MCG/ACT inhaler Inhale 1-2 puffs into the lungs every 6 (six) hours as needed for wheezing or shortness of breath. 11/11/22   Valentino Nose, NP  cyclobenzaprine (FLEXERIL) 10 MG tablet Take 1 tablet (10 mg total) by mouth 3 (three) times daily as needed for muscle spasms. Patient not taking: Reported on 06/15/2022 12/30/21   Tereso Newcomer, MD  ibuprofen (ADVIL) 600 MG tablet Take 1 tablet (600 mg total) by mouth every 6 (six) hours as needed for headache, mild pain or fever. Patient not taking: Reported on 06/15/2022 12/30/21   Tereso Newcomer, MD  medroxyPROGESTERone (DEPO-PROVERA) 150 MG/ML injection Inject 1 mL (150 mg total) into the muscle every 3 (three) months. 09/06/22   Anyanwu, Jethro Bastos, MD  medroxyPROGESTERone Acetate 150 MG/ML SUSY ADMINISTER 1 ML(150 MG) IN THE MUSCLE EVERY 3 MONTHS 09/06/22   Brock Bad, MD  montelukast (SINGULAIR) 10 MG tablet Take 1 tablet (10 mg total) by mouth at bedtime. 11/11/22   Valentino Nose, NP  ipratropium  (ATROVENT) 0.03 % nasal spray Place 2 sprays into both nostrils 2 (two) times daily. Patient not taking: Reported on 07/31/2018 06/21/18 11/13/18  Bing Neighbors, NP      Allergies    Patient has no known allergies.    Review of Systems   Review of Systems  Respiratory:  Positive for cough.   All other systems reviewed and are negative.   Physical Exam Updated Vital Signs BP (!) 142/85 (BP Location: Right Arm)   Pulse 84   Temp 98.1 F (36.7 C) (Oral)   Resp 20   SpO2 97%  Physical Exam Vitals and nursing note reviewed.  Constitutional:      Appearance: She is well-developed.  HENT:     Head: Normocephalic and atraumatic.     Mouth/Throat:     Mouth: Mucous membranes are moist.  Eyes:     Extraocular Movements: Extraocular movements intact.     Pupils: Pupils are equal, round, and reactive to light.  Cardiovascular:     Rate and Rhythm: Normal rate and regular rhythm.  Pulmonary:     Effort: Pulmonary effort is normal.     Breath sounds: Wheezing present.  Abdominal:     General: Bowel sounds are normal.     Palpations: Abdomen is soft.  Musculoskeletal:        General: Normal range of motion.  Cervical back: Normal range of motion and neck supple.  Skin:    General: Skin is warm.     Capillary Refill: Capillary refill takes less than 2 seconds.  Neurological:     General: No focal deficit present.     Mental Status: She is alert and oriented to person, place, and time.  Psychiatric:        Mood and Affect: Mood normal.        Behavior: Behavior normal.     ED Results / Procedures / Treatments   Labs (all labs ordered are listed, but only abnormal results are displayed) Labs Reviewed  RESP PANEL BY RT-PCR (RSV, FLU A&B, COVID)  [id number]    EKG None  Radiology DG Chest Portable 1 View  Result Date: 12/01/2022 CLINICAL DATA:  Cough, shortness of breath. EXAM: PORTABLE CHEST 1 VIEW COMPARISON:  11/12/2022. FINDINGS: The heart size and mediastinal  contours are within normal limits. Both lungs are clear. No acute osseous abnormality. IMPRESSION: No active disease. Electronically Signed   By: Thornell Sartorius M.D.   On: 12/01/2022 19:35    Procedures Procedures    Medications Ordered in ED Medications  AeroChamber Plus Flo-Vu Large MISC 1 each (has no administration in time range)  ipratropium-albuterol (DUONEB) 0.5-2.5 (3) MG/3ML nebulizer solution 3 mL (3 mLs Nebulization Given 12/01/22 2000)  predniSONE (DELTASONE) tablet 60 mg (60 mg Oral Given 12/01/22 2010)    ED Course/ Medical Decision Making/ A&P                                 Medical Decision Making Amount and/or Complexity of Data Reviewed Radiology: ordered.  Risk Prescription drug management.   This patient presents to the ED for concern of sob, this involves an extensive number of treatment options, and is a complaint that carries with it a high risk of complications and morbidity.  The differential diagnosis includes covid, pna, asthma   Co morbidities that complicate the patient evaluation  asthma   Additional history obtained:  Additional history obtained from epic chart review  Lab Tests:  I Ordered, and personally interpreted labs.  The pertinent results include:  covid/flu/rsv neg   Imaging Studies ordered:  I ordered imaging studies including cxr  I independently visualized and interpreted imaging which showed No active disease.  I agree with the radiologist interpretation   Cardiac Monitoring:  The patient was maintained on a cardiac monitor.  I personally viewed and interpreted the cardiac monitored which showed an underlying rhythm of: nsr   Medicines ordered and prescription drug management:  I ordered medication including duoneb/prednisone  for cough  Reevaluation of the patient after these medicines showed that the patient improved I have reviewed the patients home medicines and have made adjustments as needed   Test  Considered:  cxr   Problem List / ED Course:  Cough:  pt's covid is neg, but there are family members at home that have tested positive.  I told her that I suspect she has it, but is just not yet testing positive.   Asthma exac:  pt is feeling better after neb.  She is given prednisone here (she did not want a shot) and she's given a spacer for home.  She will be d/c with prednisone.  She is to return if worse.  F/u with pcp.   Reevaluation:  After the interventions noted above, I reevaluated the patient and found  that they have :improved   Social Determinants of Health:  Lives at home   Dispostion:  After consideration of the diagnostic results and the patients response to treatment, I feel that the patent would benefit from discharge with outpatient f/u.    Carol Hendrix was evaluated in Emergency Department on 12/01/2022 for the symptoms described in the history of present illness. She was evaluated in the context of the global COVID-19 pandemic, which necessitated consideration that the patient might be at risk for infection with the SARS-CoV-2 virus that causes COVID-19. Institutional protocols and algorithms that pertain to the evaluation of patients at risk for COVID-19 are in a state of rapid change based on information released by regulatory bodies including the CDC and federal and state organizations. These policies and algorithms were followed during the patient's care in the ED.         Final Clinical Impression(s) / ED Diagnoses Final diagnoses:  Mild intermittent asthma with exacerbation  Viral upper respiratory tract infection    Rx / DC Orders ED Discharge Orders          Ordered    predniSONE (DELTASONE) 50 MG tablet  Daily with breakfast        12/01/22 2045              Jacalyn Lefevre, MD 12/01/22 2045

## 2022-12-01 NOTE — ED Triage Notes (Signed)
Pt coming in with continued shortness of breath, has been dealing with asthma symptoms x3 weeks, has been exposed to COVID positive family members within the last few days, does have audible nasal congestion as well as head congestion. Pt endorses a " junky cough " which was hear in triage as well.

## 2022-12-25 DIAGNOSIS — Z419 Encounter for procedure for purposes other than remedying health state, unspecified: Secondary | ICD-10-CM | POA: Diagnosis not present

## 2023-01-05 ENCOUNTER — Ambulatory Visit
Admission: EM | Admit: 2023-01-05 | Discharge: 2023-01-05 | Disposition: A | Discharge status: Home / Self Care | Payer: Medicaid Other | Attending: Internal Medicine | Admitting: Internal Medicine

## 2023-01-05 DIAGNOSIS — B349 Viral infection, unspecified: Secondary | ICD-10-CM | POA: Diagnosis not present

## 2023-01-05 DIAGNOSIS — R051 Acute cough: Secondary | ICD-10-CM | POA: Diagnosis not present

## 2023-01-05 DIAGNOSIS — R062 Wheezing: Secondary | ICD-10-CM

## 2023-01-05 DIAGNOSIS — Z1152 Encounter for screening for COVID-19: Secondary | ICD-10-CM | POA: Diagnosis not present

## 2023-01-05 DIAGNOSIS — J4521 Mild intermittent asthma with (acute) exacerbation: Secondary | ICD-10-CM | POA: Diagnosis not present

## 2023-01-05 LAB — POCT RAPID STREP A (OFFICE): Rapid Strep A Screen: NEGATIVE

## 2023-01-05 MED ORDER — BENZONATATE 200 MG PO CAPS
200.0000 mg | ORAL_CAPSULE | Freq: Three times a day (TID) | ORAL | 0 refills | Status: DC | PRN
Start: 2023-01-05 — End: 2023-04-22

## 2023-01-05 MED ORDER — PREDNISONE 20 MG PO TABS
40.0000 mg | ORAL_TABLET | Freq: Every day | ORAL | 0 refills | Status: AC
Start: 2023-01-05 — End: 2023-01-10

## 2023-01-05 MED ORDER — IPRATROPIUM-ALBUTEROL 0.5-2.5 (3) MG/3ML IN SOLN
3.0000 mL | Freq: Once | RESPIRATORY_TRACT | Status: AC
  Administered 2023-01-05: 3 mL via RESPIRATORY_TRACT

## 2023-01-05 NOTE — ED Triage Notes (Signed)
Pt reports sore throat, headache, wheezing and cough x 2 day.   Pt requested Strep test as her boss had Strep.

## 2023-01-05 NOTE — Discharge Instructions (Signed)
The clinic will contact you with results of the COVID test done today if positive.  Please start prednisone daily to help with your asthma.  Continue albuterol inhaler as needed.  Tessalon as needed for cough.  Lots of rest and fluids.  Please follow-up with your PCP in 2 days for recheck.  Please go to the ER for any worsening symptoms.  I hope you feel better soon!

## 2023-01-05 NOTE — ED Provider Notes (Signed)
[facility]    CSN: [id number] Arrival date & time: 01/05/23  1519      History   Chief Complaint Chief Complaint  Patient presents with   Sore Throat    HPI Carol Hendrix is a 22 y.o. female  presents for evaluation of URI symptoms for 2 days. Patient reports associated symptoms of cough, congestion headache, sore throat, wheezing. Denies N/V/D, fevers, ear pain, body aches. Patient does have a hx of asthma. Patient does have a history of smoking.  Been using her albuterol inhaler more often with temporary improvement.  Reports her boss had strep throat last week.  Pt has taken a leftover steroid prescription she had.  She took 1 pill and does not know the milligrams. Pt has no other concerns at this time.    Sore Throat Associated symptoms include headaches.    Past Medical History:  Diagnosis Date   Anemia    Asthma    Delivery of twins, both live 12/29/2021   Preterm premature rupture of membranes (PPROM) delivered, current hospitalization 12/27/2021   Preterm premature rupture of membranes (PPROM) with onset of labor after 24 hours of rupture in second trimester, antepartum 11/22/2021   Completed antibiotics and BMZ.  Pt left hospital AMA at 29 weeks due to childcare issues    Patient Active Problem List   Diagnosis Date Noted   Postpartum care following vaginal delivery 03/22/2022   Contraception management 03/22/2022   Syncope 08/11/2021   Moderate persistent asthma 03/25/2018   Tobacco use disorder 03/25/2018    Past Surgical History:  Procedure Laterality Date   NO PAST SURGERIES      OB History     Gravida  3   Para  3   Term  2   Preterm  1   AB  0   Living  4      SAB  0   IAB  0   Ectopic  0   Multiple  1   Live Births  4            Home Medications    Prior to Admission medications   Medication Sig Start Date End Date Taking? Authorizing Provider  benzonatate (TESSALON) 200 MG capsule Take 1 capsule (200 mg  total) by mouth 3 (three) times daily as needed. 01/05/23  Yes Radford Pax, NP  predniSONE (DELTASONE) 20 MG tablet Take 2 tablets (40 mg total) by mouth daily with breakfast for 5 days. 01/05/23 01/10/23 Yes Radford Pax, NP  acetaminophen (TYLENOL) 500 MG tablet Take 1,000 mg by mouth every 6 (six) hours as needed for moderate pain. Patient not taking: Reported on 06/15/2022    [provider]  albuterol (VENTOLIN HFA) 108 (90 Base) MCG/ACT inhaler Inhale 1-2 puffs into the lungs every 6 (six) hours as needed for wheezing or shortness of breath. 11/11/22   Valentino Nose, NP  cyclobenzaprine (FLEXERIL) 10 MG tablet Take 1 tablet (10 mg total) by mouth 3 (three) times daily as needed for muscle spasms. Patient not taking: Reported on 06/15/2022 12/30/21   Tereso Newcomer, MD  ibuprofen (ADVIL) 600 MG tablet Take 1 tablet (600 mg total) by mouth every 6 (six) hours as needed for headache, mild pain or fever. Patient not taking: Reported on 06/15/2022 12/30/21   Tereso Newcomer, MD  medroxyPROGESTERone (DEPO-PROVERA) 150 MG/ML injection Inject 1 mL (150 mg total) into the muscle every 3 (three) months. 09/06/22   Anyanwu, Jethro Bastos,  MD  medroxyPROGESTERone Acetate 150 MG/ML SUSY ADMINISTER 1 ML(150 MG) IN THE MUSCLE EVERY 3 MONTHS 09/06/22   Brock Bad, MD  montelukast (SINGULAIR) 10 MG tablet Take 1 tablet (10 mg total) by mouth at bedtime. 11/11/22   Valentino Nose, NP  ondansetron (ZOFRAN-ODT) 4 MG disintegrating tablet Take 1 tablet (4 mg total) by mouth every 8 (eight) hours as needed. 12/01/22   Jacalyn Lefevre, MD  ipratropium (ATROVENT) 0.03 % nasal spray Place 2 sprays into both nostrils 2 (two) times daily. Patient not taking: Reported on 07/31/2018 06/21/18 11/13/18  Bing Neighbors, NP    Family History Family History  Problem Relation Age of Onset   Healthy Mother    Healthy Father    Pulmonary fibrosis Maternal Grandmother     Social History Social History    Tobacco Use   Smoking status: Never   Smokeless tobacco: Never   Tobacco comments:    rolls marijuana in with tobacco  Vaping Use   Vaping status: Former   Substances: Nicotine  Substance Use Topics   Alcohol use: Not Currently   Drug use: Not Currently    Types: Marijuana    Comment: last smoked marijuana one year ago as of 07/16/2021     Allergies   Patient has no known allergies.   Review of Systems Review of Systems  HENT:  Positive for congestion and sore throat.   Respiratory:  Positive for cough and wheezing.   Neurological:  Positive for headaches.     Physical Exam Triage Vital Signs ED Triage Vitals  Encounter Vitals Group     BP 01/05/23 1557 119/78     Systolic BP Percentile --      Diastolic BP Percentile --      Pulse Rate 01/05/23 1557 86     Resp 01/05/23 1557 18     Temp 01/05/23 1557 99.5 F (37.5 C)     Temp Source 01/05/23 1557 Oral     SpO2 01/05/23 1557 96 %     Weight --      Height --      Head Circumference --      Peak Flow --      Pain Score 01/05/23 1600 6     Pain Loc --      Pain Education --      Exclude from Growth Chart --    No data found.  Updated Vital Signs BP 119/78 (BP Location: Right Arm)   Pulse 86   Temp 99.5 F (37.5 C) (Oral)   Resp 18   SpO2 96%   Breastfeeding No   Visual Acuity Right Eye Distance:   Left Eye Distance:   Bilateral Distance:    Right Eye Near:   Left Eye Near:    Bilateral Near:     Physical Exam Vitals and nursing note reviewed.  Constitutional:      General: She is not in acute distress.    Appearance: She is well-developed. She is not ill-appearing.  HENT:     Head: Normocephalic and atraumatic.     Right Ear: Tympanic membrane and ear canal normal.     Left Ear: Tympanic membrane and ear canal normal.     Nose: Congestion present.     Mouth/Throat:     Mouth: Mucous membranes are moist.     Pharynx: Oropharynx is clear. Uvula midline. Posterior oropharyngeal erythema  present.     Tonsils: No tonsillar exudate or tonsillar abscesses.  Eyes:     Conjunctiva/sclera: Conjunctivae normal.     Pupils: Pupils are equal, round, and reactive to light.  Cardiovascular:     Rate and Rhythm: Normal rate and regular rhythm.     Heart sounds: Normal heart sounds.  Pulmonary:     Effort: Pulmonary effort is normal.     Breath sounds: Wheezing present.     Comments: Expiratory wheezing bilateral bases Musculoskeletal:     Cervical back: Normal range of motion and neck supple.  Lymphadenopathy:     Cervical: No cervical adenopathy.  Skin:    General: Skin is warm and dry.  Neurological:     General: No focal deficit present.     Mental Status: She is alert and oriented to person, place, and time.  Psychiatric:        Mood and Affect: Mood normal.        Behavior: Behavior normal.      UC Treatments / Results  Labs (all labs ordered are listed, but only abnormal results are displayed) Labs Reviewed  SARS CORONAVIRUS 2 (TAT 6-24 HRS)  CULTURE, GROUP A STREP [facility])  POCT RAPID STREP A (OFFICE)    EKG   Radiology No results found.  Procedures Procedures (including critical care time)  Medications Ordered in UC Medications  ipratropium-albuterol (DUONEB) 0.5-2.5 (3) MG/3ML nebulizer solution 3 mL (has no administration in time range)    Initial Impression / Assessment and Plan / UC Course  I have reviewed the triage vital signs and the nursing notes.  Pertinent labs & imaging results that were available during my care of the patient were reviewed by me and considered in my medical decision making (see chart for details).     Reviewed exam and symptoms with patient.  Negative rapid strep, will culture.  Patient given DuoNeb treatment in clinic.  Reports improvement of symptoms after this.  COVID PCR and will contact if positive.  Discussed viral illness with asthma exacerbation.  Will start prednisone.  Patient instructed not to take what ever  she has leftover at home as dosage and amount remaining is unknown.  She is to continue her albuterol inhaler as needed.  PCP follow-up 2 days for recheck.  ER precautions reviewed and patient verbalized understanding. Final Clinical Impressions(s) / UC Diagnoses   Final diagnoses:  Acute cough  Wheezing  Viral illness  Mild intermittent asthma with acute exacerbation     Discharge Instructions      The clinic will contact you with results of the COVID test done today if positive.  Please start prednisone daily to help with your asthma.  Continue albuterol inhaler as needed.  Tessalon as needed for cough.  Lots of rest and fluids.  Please follow-up with your PCP in 2 days for recheck.  Please go to the ER for any worsening symptoms.  I hope you feel better soon!     ED Prescriptions     Medication Sig Dispense Auth. Provider   predniSONE (DELTASONE) 20 MG tablet Take 2 tablets (40 mg total) by mouth daily with breakfast for 5 days. 10 tablet Radford Pax, NP   benzonatate (TESSALON) 200 MG capsule Take 1 capsule (200 mg total) by mouth 3 (three) times daily as needed. 20 capsule Radford Pax, NP      PDMP not reviewed this encounter.   Radford Pax, NP 01/05/23 1623

## 2023-01-06 LAB — SARS CORONAVIRUS 2 (TAT 6-24 HRS): SARS Coronavirus 2: NEGATIVE

## 2023-01-08 LAB — CULTURE, GROUP A STREP (THRC)

## 2023-01-20 ENCOUNTER — Ambulatory Visit (HOSPITAL_COMMUNITY)
Admission: EM | Admit: 2023-01-20 | Discharge: 2023-01-20 | Disposition: A | Discharge status: Home / Self Care | Payer: Medicaid Other | Attending: Emergency Medicine | Admitting: Emergency Medicine

## 2023-01-20 ENCOUNTER — Encounter (HOSPITAL_COMMUNITY): Payer: Self-pay | Admitting: *Deleted

## 2023-01-20 DIAGNOSIS — J45901 Unspecified asthma with (acute) exacerbation: Secondary | ICD-10-CM | POA: Diagnosis not present

## 2023-01-20 DIAGNOSIS — J069 Acute upper respiratory infection, unspecified: Secondary | ICD-10-CM

## 2023-01-20 MED ORDER — PREDNISONE 20 MG PO TABS: 40.0000 mg | ORAL_TABLET | Freq: Every day | ORAL | 0 refills | Status: AC

## 2023-01-20 MED ORDER — ALBUTEROL SULFATE HFA 108 (90 BASE) MCG/ACT IN AERS
1.0000 | INHALATION_SPRAY | Freq: Four times a day (QID) | RESPIRATORY_TRACT | 0 refills | Status: DC | PRN
Start: 2023-01-20 — End: 2023-05-13

## 2023-01-20 MED ORDER — PROMETHAZINE-DM 6.25-15 MG/5ML PO SYRP: 5.0000 mL | ORAL_SOLUTION | Freq: Three times a day (TID) | ORAL | 0 refills | Status: DC | PRN

## 2023-01-20 MED ORDER — DEXAMETHASONE SODIUM PHOSPHATE 10 MG/ML IJ SOLN
10.0000 mg | Freq: Once | INTRAMUSCULAR | Status: AC
  Administered 2023-01-20: 10 mg via INTRAMUSCULAR

## 2023-01-20 MED ORDER — DEXAMETHASONE SODIUM PHOSPHATE 10 MG/ML IJ SOLN
INTRAMUSCULAR | Status: AC
  Filled 2023-01-20: qty 1

## 2023-01-20 NOTE — Discharge Instructions (Addendum)
Continue to use your albuterol inhaler as needed.  You can take the cough medicine as needed as well, do not drink or drive on this as it may cause drowsiness.  Start the steroids tomorrow with breakfast.  It is important that you follow-up with a primary care provider regarding control of your asthma.  Return to clinic if you develop fever, shortness of breath, or no improvement despite these interventions.

## 2023-01-20 NOTE — ED Triage Notes (Signed)
Pt states she has cough, congestion, wheezing x 2-3 days. She has been using her albuterol MDI.   She did have some left over prednisone she finished that Rx.

## 2023-01-20 NOTE — ED Provider Notes (Signed)
[facility]    CSN: [id number] Arrival date & time: 01/20/23  4098      History   Chief Complaint Chief Complaint  Patient presents with   Cough   Nasal Congestion   Wheezing    HPI Carol Hendrix is a 22 y.o. female.   Patient presents to clinic with her son for the complaint of cough, congestion, wheezing and shortness of breath over the past 3 days.  She has been using her albuterol inhaler as needed, without much relief.  Reports ongoing issues with asthma control for the past few months, thinks it is her place of living.  It has been evaluated for mold and was negative.  She has not had any fevers.  She has a mild sore throat.  No abdominal pain, nausea, vomiting or diarrhea.  Did have a leftover steroid prescription from a different urgent care that she finished about 10 days prior.  The history is provided by the patient and medical records.  Cough Associated symptoms: shortness of breath, sore throat and wheezing   Associated symptoms: no fever   Wheezing Associated symptoms: cough, shortness of breath and sore throat   Associated symptoms: no fever     Past Medical History:  Diagnosis Date   Anemia    Asthma    Delivery of twins, both live 12/29/2021   Preterm premature rupture of membranes (PPROM) delivered, current hospitalization 12/27/2021   Preterm premature rupture of membranes (PPROM) with onset of labor after 24 hours of rupture in second trimester, antepartum 11/22/2021   Completed antibiotics and BMZ.  Pt left hospital AMA at 29 weeks due to childcare issues    Patient Active Problem List   Diagnosis Date Noted   Postpartum care following vaginal delivery 03/22/2022   Contraception management 03/22/2022   Syncope 08/11/2021   Moderate persistent asthma 03/25/2018   Tobacco use disorder 03/25/2018    Past Surgical History:  Procedure Laterality Date   NO PAST SURGERIES      OB History     Gravida  3   Para  3   Term  2    Preterm  1   AB  0   Living  4      SAB  0   IAB  0   Ectopic  0   Multiple  1   Live Births  4            Home Medications    Prior to Admission medications   Medication Sig Start Date End Date Taking? Authorizing Provider  albuterol (VENTOLIN HFA) 108 (90 Base) MCG/ACT inhaler Inhale 1-2 puffs into the lungs every 6 (six) hours as needed for wheezing or shortness of breath. 11/11/22  Yes Valentino Nose, NP  albuterol (VENTOLIN HFA) 108 (90 Base) MCG/ACT inhaler Inhale 1-2 puffs into the lungs every 6 (six) hours as needed for wheezing or shortness of breath. 01/20/23  Yes Rinaldo Ratel, [place] N, FNP  predniSONE (DELTASONE) 20 MG tablet Take 2 tablets (40 mg total) by mouth daily with breakfast for 5 days. 01/20/23 01/25/23 Yes Rinaldo Ratel, [place] N, FNP  promethazine-dextromethorphan (PROMETHAZINE-DM) 6.25-15 MG/5ML syrup Take 5 mLs by mouth 3 (three) times daily as needed. 01/20/23  Yes Rinaldo Ratel, [place] N, FNP  acetaminophen (TYLENOL) 500 MG tablet Take 1,000 mg by mouth every 6 (six) hours as needed for moderate pain. Patient not taking: Reported on 06/15/2022    [provider]  benzonatate (TESSALON) 200 MG capsule Take 1  capsule (200 mg total) by mouth 3 (three) times daily as needed. 01/05/23   Radford Pax, NP  cyclobenzaprine (FLEXERIL) 10 MG tablet Take 1 tablet (10 mg total) by mouth 3 (three) times daily as needed for muscle spasms. Patient not taking: Reported on 06/15/2022 12/30/21   Tereso Newcomer, MD  ibuprofen (ADVIL) 600 MG tablet Take 1 tablet (600 mg total) by mouth every 6 (six) hours as needed for headache, mild pain or fever. Patient not taking: Reported on 06/15/2022 12/30/21   Tereso Newcomer, MD  medroxyPROGESTERone (DEPO-PROVERA) 150 MG/ML injection Inject 1 mL (150 mg total) into the muscle every 3 (three) months. 09/06/22   Anyanwu, Jethro Bastos, MD  medroxyPROGESTERone Acetate 150 MG/ML SUSY ADMINISTER 1 ML(150 MG) IN THE MUSCLE EVERY 3 MONTHS  09/06/22   Brock Bad, MD  montelukast (SINGULAIR) 10 MG tablet Take 1 tablet (10 mg total) by mouth at bedtime. 11/11/22   Valentino Nose, NP  ondansetron (ZOFRAN-ODT) 4 MG disintegrating tablet Take 1 tablet (4 mg total) by mouth every 8 (eight) hours as needed. 12/01/22   Jacalyn Lefevre, MD  ipratropium (ATROVENT) 0.03 % nasal spray Place 2 sprays into both nostrils 2 (two) times daily. Patient not taking: Reported on 07/31/2018 06/21/18 11/13/18  Bing Neighbors, NP    Family History Family History  Problem Relation Age of Onset   Healthy Mother    Healthy Father    Pulmonary fibrosis Maternal Grandmother     Social History Social History   Tobacco Use   Smoking status: Never   Smokeless tobacco: Never   Tobacco comments:    rolls marijuana in with tobacco  Vaping Use   Vaping status: Former   Substances: Nicotine  Substance Use Topics   Alcohol use: Not Currently   Drug use: Not Currently    Types: Marijuana    Comment: last smoked marijuana one year ago as of 07/16/2021     Allergies   Patient has no known allergies.   Review of Systems Review of Systems  Constitutional:  Negative for fever.  HENT:  Positive for congestion and sore throat.   Respiratory:  Positive for cough, shortness of breath and wheezing.   Gastrointestinal:  Negative for abdominal pain, diarrhea, nausea and vomiting.     Physical Exam Triage Vital Signs ED Triage Vitals  Encounter Vitals Group     BP 01/20/23 0947 (!) 154/92     Systolic BP Percentile --      Diastolic BP Percentile --      Pulse Rate 01/20/23 0947 78     Resp 01/20/23 0947 20     Temp 01/20/23 0947 98.4 F (36.9 C)     Temp Source 01/20/23 0947 Oral     SpO2 01/20/23 0947 94 %     Weight --      Height --      Head Circumference --      Peak Flow --      Pain Score 01/20/23 0945 0     Pain Loc --      Pain Education --      Exclude from Growth Chart --    No data found.  Updated Vital Signs BP  (!) 154/92 (BP Location: Left Arm)   Pulse 78   Temp 98.4 F (36.9 C) (Oral)   Resp 20   SpO2 94%   Visual Acuity Right Eye Distance:   Left Eye Distance:   Bilateral Distance:  Right Eye Near:   Left Eye Near:    Bilateral Near:     Physical Exam Vitals and nursing note reviewed.  Constitutional:      Appearance: Normal appearance.  HENT:     Head: Normocephalic and atraumatic.     Right Ear: External ear normal.     Left Ear: External ear normal.     Nose: Nose normal.     Mouth/Throat:     Mouth: Mucous membranes are moist.     Pharynx: Posterior oropharyngeal erythema present.  Eyes:     Conjunctiva/sclera: Conjunctivae normal.  Cardiovascular:     Rate and Rhythm: Normal rate and regular rhythm.     Heart sounds: Normal heart sounds. No murmur heard. Pulmonary:     Effort: Pulmonary effort is normal. No respiratory distress.     Breath sounds: Wheezing present.  Skin:    General: Skin is warm and dry.  Neurological:     General: No focal deficit present.     Mental Status: She is alert and oriented to person, place, and time.  Psychiatric:        Mood and Affect: Mood normal.        Behavior: Behavior normal.      UC Treatments / Results  Labs (all labs ordered are listed, but only abnormal results are displayed) Labs Reviewed - No data to display  EKG   Radiology No results found.  Procedures Procedures (including critical care time)  Medications Ordered in UC Medications  dexamethasone (DECADRON) injection 10 mg (10 mg Intramuscular Given 01/20/23 1008)    Initial Impression / Assessment and Plan / UC Course  I have reviewed the triage vital signs and the nursing notes.  Pertinent labs & imaging results that were available during my care of the patient were reviewed by me and considered in my medical decision making (see chart for details).  Vitals and triage reviewed, patient is hemodynamically stable.  Having expiratory wheezing and  diminished lung sounds in all fields, oxygenation stable and able to speak in full sentences. Suspect asthma exacerbation, will send in prednisone and do an IM steroid injection in clinic for symptoms.  Symptomatic management discussed for cough and pharyngitis.  Encouraged to schedule w/ PCP for asthma management.  Plan of care, follow-up care and return precautions given, no questions at this time.     Final Clinical Impressions(s) / UC Diagnoses   Final diagnoses:  Moderate asthma with acute exacerbation, unspecified whether persistent  Upper respiratory tract infection, unspecified type     Discharge Instructions      Continue to use your albuterol inhaler as needed.  You can take the cough medicine as needed as well, do not drink or drive on this as it may cause drowsiness.  Start the steroids tomorrow with breakfast.  It is important that you follow-up with a primary care provider regarding control of your asthma.  Return to clinic if you develop fever, shortness of breath, or no improvement despite these interventions.     ED Prescriptions     Medication Sig Dispense Auth. Provider   predniSONE (DELTASONE) 20 MG tablet Take 2 tablets (40 mg total) by mouth daily with breakfast for 5 days. 10 tablet Rinaldo Ratel, [place] N, FNP   albuterol (VENTOLIN HFA) 108 (90 Base) MCG/ACT inhaler Inhale 1-2 puffs into the lungs every 6 (six) hours as needed for wheezing or shortness of breath. 18 g Batoul Limes, [place] N, FNP   promethazine-dextromethorphan (PROMETHAZINE-DM) 6.25-15 MG/5ML  syrup Take 5 mLs by mouth 3 (three) times daily as needed. 118 mL Davonda Ausley, [place] N, [place]      PDMP not reviewed this encounter.   Martyn Timme, [place] N, [place] 01/20/23 1018

## 2023-01-24 DIAGNOSIS — Z419 Encounter for procedure for purposes other than remedying health state, unspecified: Secondary | ICD-10-CM | POA: Diagnosis not present

## 2023-01-31 ENCOUNTER — Other Ambulatory Visit: Payer: Self-pay

## 2023-01-31 ENCOUNTER — Telehealth: Payer: Medicaid Other | Admitting: Physician Assistant

## 2023-01-31 ENCOUNTER — Emergency Department (HOSPITAL_COMMUNITY)
Admission: EM | Admit: 2023-01-31 | Discharge: 2023-01-31 | Disposition: A | Discharge status: Home / Self Care | Payer: Medicaid Other | Attending: Emergency Medicine | Admitting: Emergency Medicine

## 2023-01-31 DIAGNOSIS — Z20822 Contact with and (suspected) exposure to covid-19: Secondary | ICD-10-CM | POA: Insufficient documentation

## 2023-01-31 DIAGNOSIS — K0889 Other specified disorders of teeth and supporting structures: Secondary | ICD-10-CM | POA: Diagnosis not present

## 2023-01-31 DIAGNOSIS — Z789 Other specified health status: Secondary | ICD-10-CM | POA: Diagnosis not present

## 2023-01-31 DIAGNOSIS — R519 Headache, unspecified: Secondary | ICD-10-CM | POA: Insufficient documentation

## 2023-01-31 DIAGNOSIS — G4489 Other headache syndrome: Secondary | ICD-10-CM | POA: Diagnosis not present

## 2023-01-31 DIAGNOSIS — J45909 Unspecified asthma, uncomplicated: Secondary | ICD-10-CM | POA: Diagnosis not present

## 2023-01-31 DIAGNOSIS — Z743 Need for continuous supervision: Secondary | ICD-10-CM | POA: Diagnosis not present

## 2023-01-31 DIAGNOSIS — M549 Dorsalgia, unspecified: Secondary | ICD-10-CM | POA: Diagnosis not present

## 2023-01-31 LAB — CBC WITH DIFFERENTIAL/PLATELET
Abs Immature Granulocytes: 0.02 10*3/uL (ref 0.00–0.07)
Basophils Absolute: 0 10*3/uL (ref 0.0–0.1)
Basophils Relative: 0 %
Eosinophils Absolute: 0.1 10*3/uL (ref 0.0–0.5)
Eosinophils Relative: 1 %
HCT: 40.4 % (ref 36.0–46.0)
Hemoglobin: 13.4 g/dL (ref 12.0–15.0)
Immature Granulocytes: 0 %
Lymphocytes Relative: 19 %
Lymphs Abs: 2 10*3/uL (ref 0.7–4.0)
MCH: 27.2 pg (ref 26.0–34.0)
MCHC: 33.2 g/dL (ref 30.0–36.0)
MCV: 82.1 fL (ref 80.0–100.0)
Monocytes Absolute: 0.9 10*3/uL (ref 0.1–1.0)
Monocytes Relative: 8 %
Neutro Abs: 7.3 10*3/uL (ref 1.7–7.7)
Neutrophils Relative %: 72 %
Platelets: 312 10*3/uL (ref 150–400)
RBC: 4.92 MIL/uL (ref 3.87–5.11)
RDW: 15 % (ref 11.5–15.5)
WBC: 10.3 10*3/uL (ref 4.0–10.5)
nRBC: 0 % (ref 0.0–0.2)

## 2023-01-31 LAB — BASIC METABOLIC PANEL
Anion gap: 13 (ref 5–15)
BUN: 7 mg/dL (ref 6–20)
CO2: 20 mmol/L — ABNORMAL LOW (ref 22–32)
Calcium: 8.7 mg/dL — ABNORMAL LOW (ref 8.9–10.3)
Chloride: 104 mmol/L (ref 98–111)
Creatinine, Ser: 0.74 mg/dL (ref 0.44–1.00)
GFR, Estimated: >60 mL/min (ref >60)
Glucose, Bld: 99 mg/dL (ref 70–99)
Potassium: 4 mmol/L (ref 3.5–5.1)
Sodium: 137 mmol/L (ref 135–145)

## 2023-01-31 LAB — SARS CORONAVIRUS 2 BY RT PCR: SARS Coronavirus 2 by RT PCR: NEGATIVE

## 2023-01-31 LAB — HCG, QUANTITATIVE, PREGNANCY: hCG, Beta Chain, Quant, S: <1 m[IU]/mL (ref <5)

## 2023-01-31 MED ORDER — AMOXICILLIN-POT CLAVULANATE 875-125 MG PO TABS
1.0000 | ORAL_TABLET | Freq: Two times a day (BID) | ORAL | 0 refills | Status: DC
Start: 2023-01-31 — End: 2023-04-18

## 2023-01-31 MED ORDER — DIPHENHYDRAMINE HCL 50 MG/ML IJ SOLN
25.0000 mg | Freq: Once | INTRAMUSCULAR | Status: AC
  Administered 2023-01-31: 25 mg via INTRAVENOUS
  Filled 2023-01-31: qty 1

## 2023-01-31 MED ORDER — ACETAMINOPHEN 500 MG PO TABS
1000.0000 mg | ORAL_TABLET | Freq: Once | ORAL | Status: AC
  Administered 2023-01-31: 1000 mg via ORAL
  Filled 2023-01-31: qty 2

## 2023-01-31 MED ORDER — LACTATED RINGERS IV BOLUS
1000.0000 mL | Freq: Once | INTRAVENOUS | Status: AC
  Administered 2023-01-31: 1000 mL via INTRAVENOUS

## 2023-01-31 MED ORDER — PROCHLORPERAZINE EDISYLATE 10 MG/2ML IJ SOLN
10.0000 mg | Freq: Once | INTRAMUSCULAR | Status: AC
  Administered 2023-01-31: 10 mg via INTRAVENOUS
  Filled 2023-01-31: qty 2

## 2023-01-31 NOTE — ED Provider Triage Note (Signed)
Emergency Medicine Provider Triage Evaluation Note  Carol Hendrix , a 22 y.o. female  was evaluated in triage.  Pt complains of severe headache. Has been going on for several days but got worse last night. Pain rated 10/10. No falls/trauma. A/w nausea/vomiting, photophobia. Located behind bilateral eyes and on the top of her head. Had a headache like this one time before, presumably had mono but she is not sure. Hasn't taken anything for the pain today. Has tried excedrin, tylenol which didn't help. Denies fevers/chills. Has been sick all week coughing up mucous. Gradual onset.   Review of Systems  Positive: Cough, mucous Negative: Fevers/chills, SOB, hemoptysis  Physical Exam  BP 120/75 (BP Location: Right Arm)   Pulse 87   Temp 98.4 F (36.9 C) (Oral)   Resp 19   SpO2 97%  Gen:   Awake, tearful Resp:  Normal effort. Intermittently coughing, spitting up mucous.  MSK:   Moves extremities without difficulty   Medical Decision Making  Medically screening exam initiated at 8:38 AM.  Appropriate orders placed.  Started headache cocktail and labs. [name] was informed that the remainder of the evaluation will be completed by another provider, this initial triage assessment does not replace that evaluation, and the importance of remaining in the ED until their evaluation is complete.     Loetta Rough, MD 01/31/23 [phone]

## 2023-01-31 NOTE — ED Notes (Signed)
Patient verbalizes understanding of discharge instructions. Opportunity for questioning and answers were provided. Pt discharged from ED. 
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
## 2023-01-31 NOTE — Progress Notes (Signed)
The patient no-showed for appointment despite this provider sending direct link, reaching out via phone with no response and waiting for at least 10 minutes from appointment time for patient to join. They will be marked as a NS for this appointment/time.   Mar Daring, PA-C

## 2023-01-31 NOTE — Discharge Instructions (Signed)
Thank you for coming to [facility] Emergency Department. You were seen for headache. We did an exam, labs, and these showed no acute findings. Your toothache could be contributing as well. You were prescribed augmentin to take twice per day for possible dental infection. Please call a dentist to be evaluated to have the tooth pulled. Your covid test is still pending and you can check the results in MyChart. Please follow up with your primary care provider within 1 week.  You can alternate taking Tylenol and ibuprofen as needed for pain. You can take 650mg  tylenol (acetaminophen) every 4-6 hours, and 600 mg ibuprofen 3 times a day.   Do not hesitate to return to the ED or call 911 if you experience: -Worsening symptoms -Visual changes -Numbness/tingling -Neck pain/stiffness -Lightheadedness, passing out -Fevers/chills -Anything else that concerns you

## 2023-02-02 IMAGING — US US OB COMP LESS 14 WK
1 series · 15 of 28 positions shown · non-contrast
Comparison: No priors.

CLINICAL DATA: 21-year-old pregnant female presenting with
abdominal pain.

EXAM:
TWIN OBSTETRICAL ULTRASOUND <14 WKS
TECHNIQUE: Transabdominal ultrasound was performed for evaluation of the
gestation as well as the maternal uterus and adnexal regions.

[Series 1: us ob comp less 14 wk · 38 acquisitions, 15 frames shown]
[im 1/38]
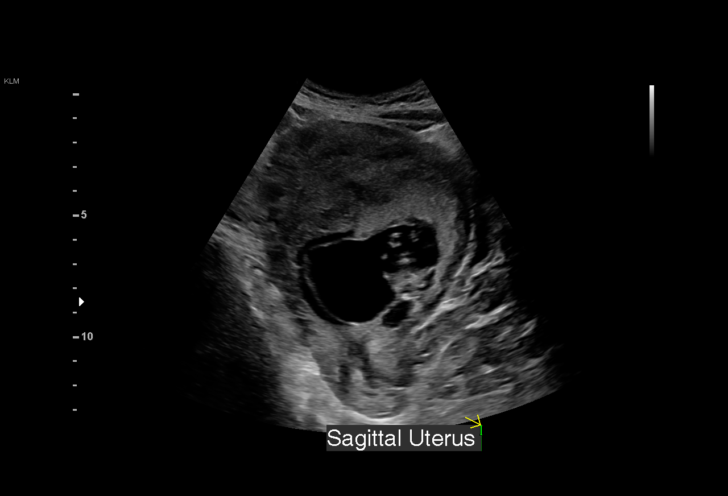
[im 3/38]
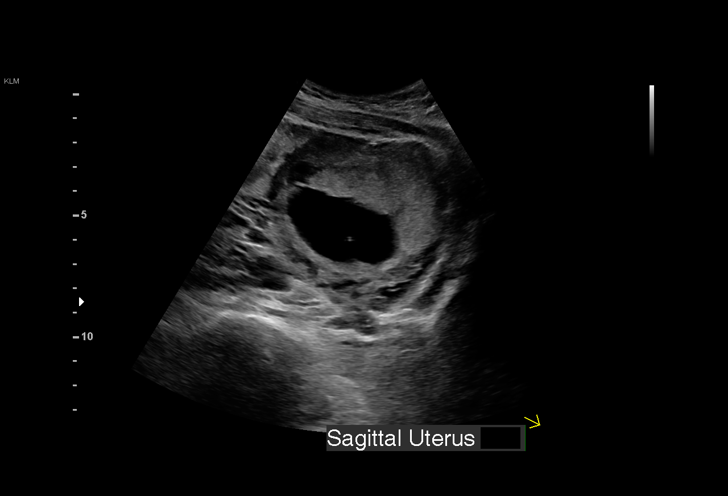
[im 6/38]
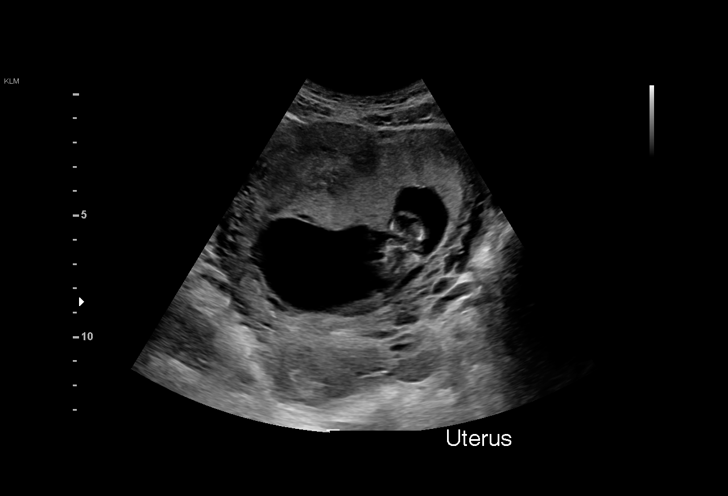
[im 9/38]
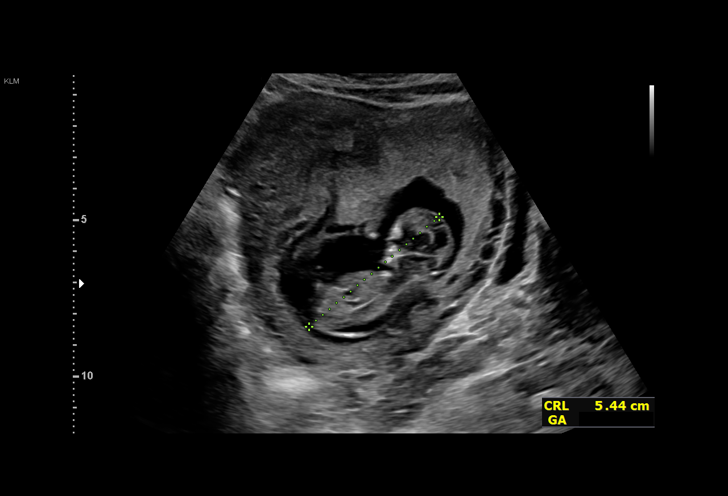
[im 11/38]
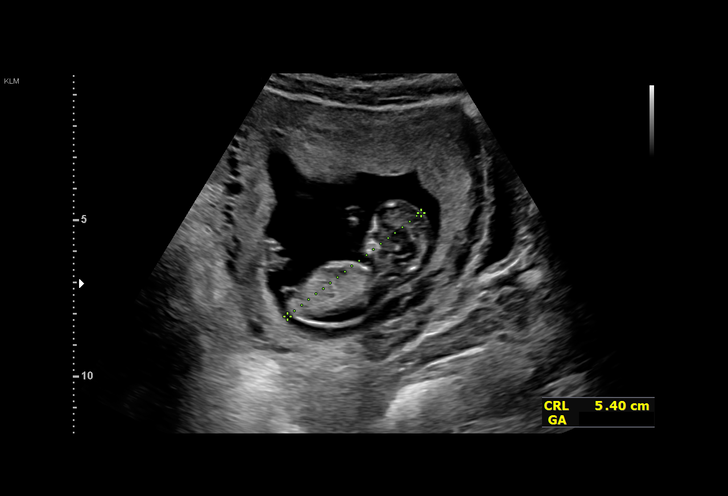
[im 14/38]
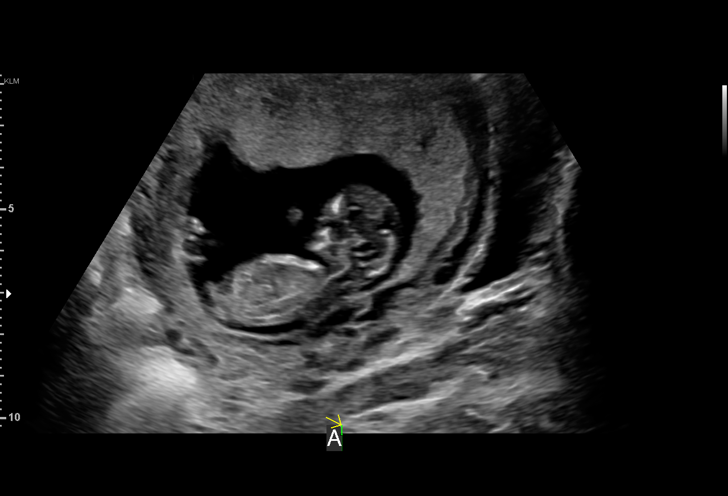
[im 17/38]
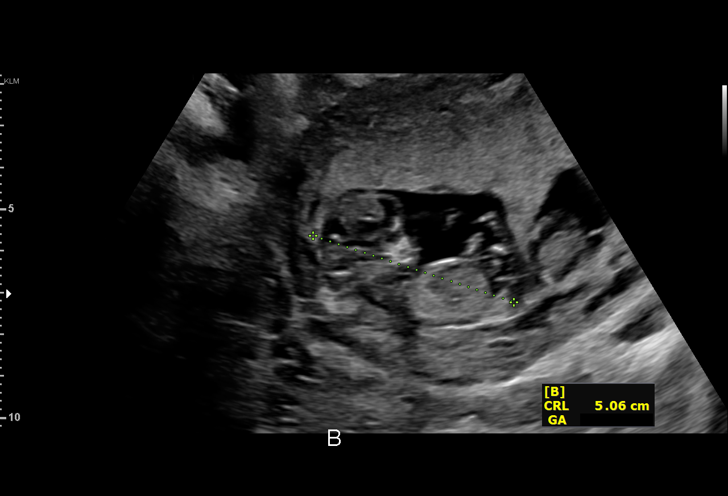
[im 20/38]
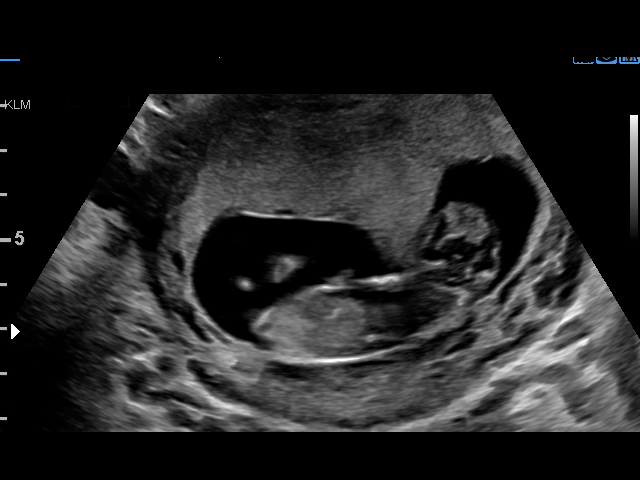
[im 21/38]
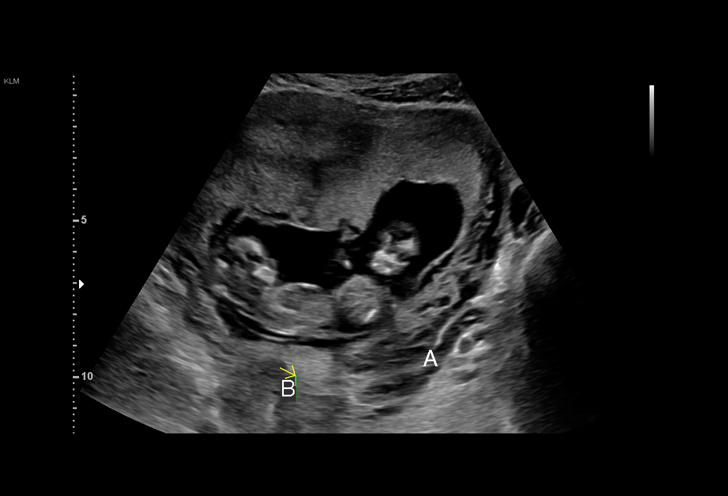
[im 24/38]
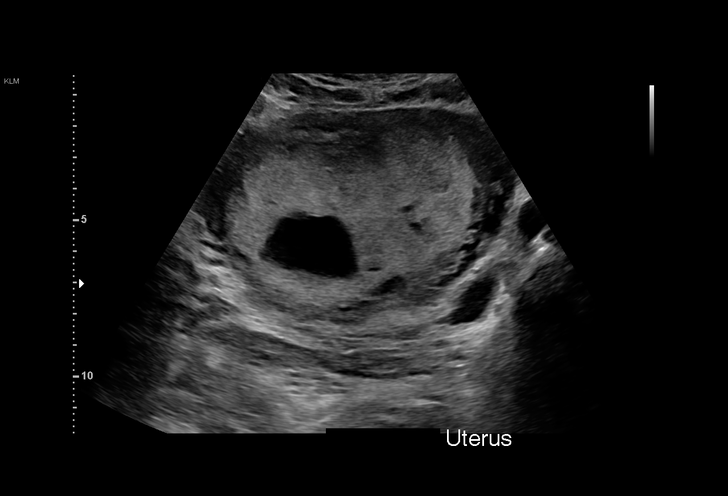
[im 27/38]
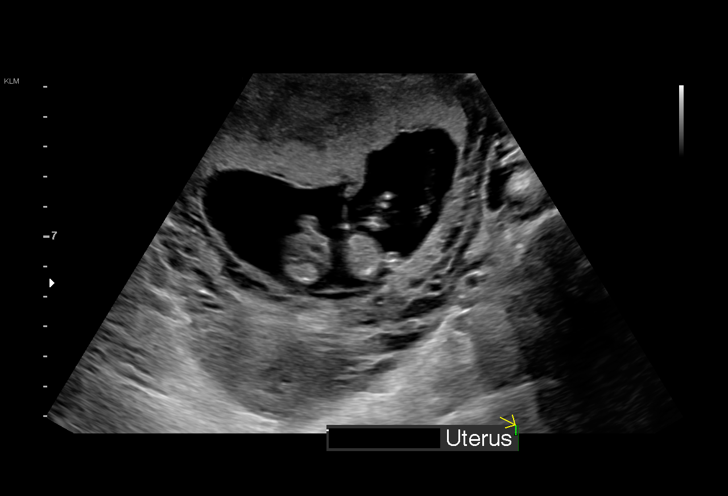
[im 29/38]
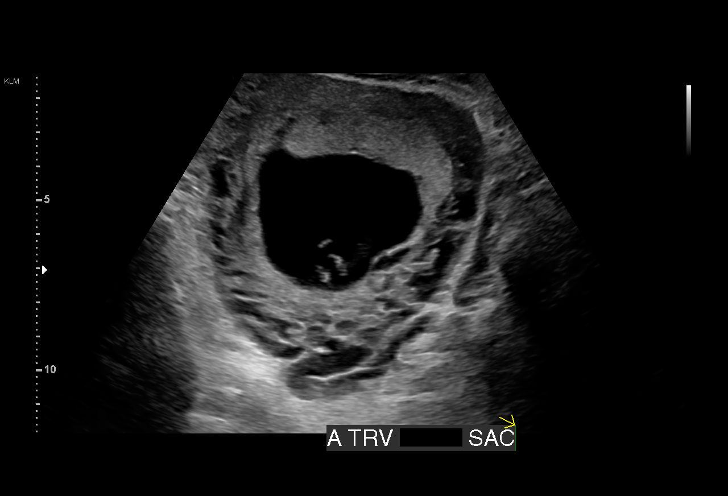
[im 32/38]
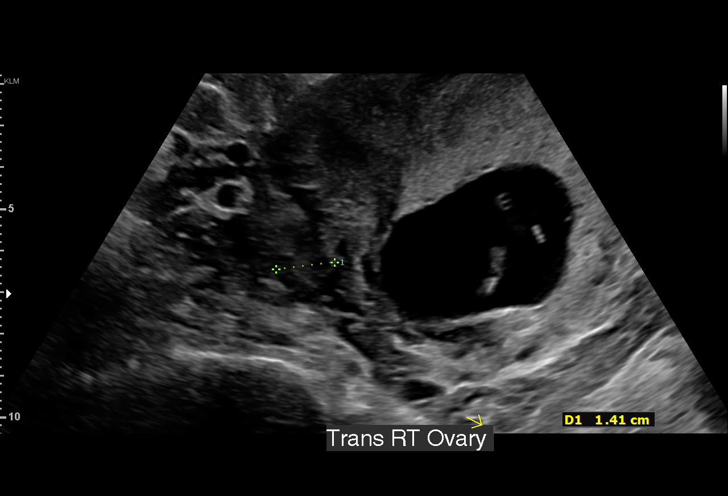
[im 35/38]
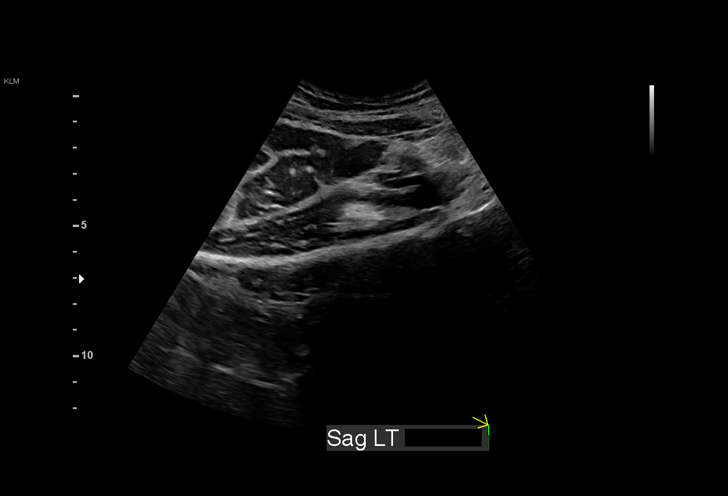
[im 38/38]
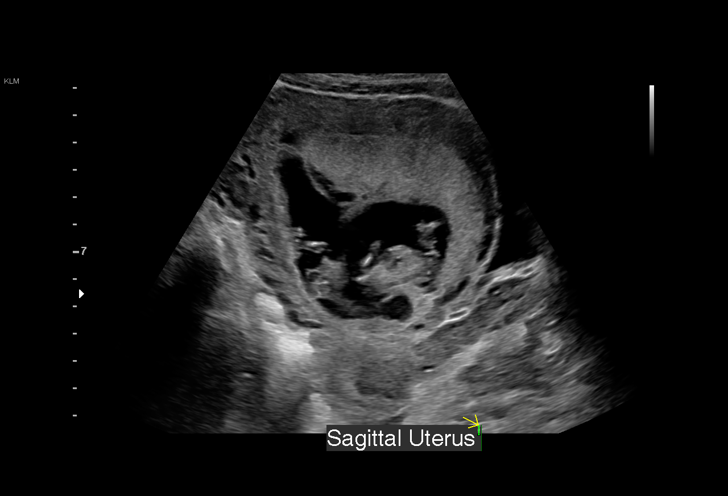

[15 of 28 positions shown; findings below may reference images not displayed]

FINDINGS: Number of IUPs:  2

Chorionicity/Amnionicity:  Dichorionic-diamniotic (thick membrane)

TWIN 1

Yolk sac:  None

Embryo:  Present

Cardiac Activity: Present

Heart Rate: 162 bpm

CRL:   54.6 mm   12 w 0 d                  US EDC: 02/19/2022

TWIN 2

Yolk sac:  None

Embryo:  Present

Cardiac Activity: Present

Heart Rate: 180 bpm

CRL:   50.9 mm   11 w 5 d                  US EDC: 02/21/2022

Subchorionic hemorrhage:  None visualized.

Maternal uterus/adnexae: Uterus and right ovary are otherwise
unremarkable in appearance. Left ovary not confidently visualized.
No significant volume of free fluid in the cul-de-sac.
IMPRESSION: 1. Viable dichorionic-diamniotic twin pregnancy with no acute
findings, as detailed above.

## 2023-02-04 NOTE — ED Provider Notes (Incomplete)
[place] EM[facility]GENCY DEPARTMENT AT [facility] Provider Note   CSN: [id number] Arrival date & time: 01/31/23  2130     History {Add pertinent medical, surgical, social history, OB history to HPI:1} Chief Complaint  Patient presents with  . Back Pain  . Headache    Carol Hendrix is a 22 y.o. female with PMH as listed below who presents with severe headache. Has been going on for several days but got worse last night. Pain rated 10/10. No falls/trauma. A/w nausea/vomiting, photophobia. Located behind bilateral eyes and on the top of her head. Had a headache like this one time before, presumably had mono but she is not sure. Hasn't taken anything for the pain today. Has tried excedrin, tylenol which didn't help. Denies fevers/chills. Has been sick all week coughing up mucous. Gradual onset.   Past Medical History:  Diagnosis Date  . Anemia   . Asthma   . Delivery of twins, both live 12/29/2021  . Preterm premature rupture of membranes (PPROM) delivered, current hospitalization 12/27/2021  . Preterm premature rupture of membranes (PPROM) with onset of labor after 24 hours of rupture in second trimester, antepartum 11/22/2021   Completed antibiotics and BMZ.  Pt left hospital AMA at 29 weeks due to childcare issues       Home Medications Prior to Admission medications   Medication Sig Start Date End Date Taking? Authorizing Provider  amoxicillin-clavulanate (AUGMENTIN) 875-125 MG tablet Take 1 tablet by mouth every 12 (twelve) hours. 01/31/23  Yes Loetta Rough, MD  acetaminophen (TYLENOL) 500 MG tablet Take 1,000 mg by mouth every 6 (six) hours as needed for moderate pain. Patient not taking: Reported on 06/15/2022    [provider]  albuterol (VENTOLIN HFA) 108 (90 Base) MCG/ACT inhaler Inhale 1-2 puffs into the lungs every 6 (six) hours as needed for wheezing or shortness of breath. 11/11/22   Valentino Nose, NP  albuterol (VENTOLIN HFA) 108 (90 Base)  MCG/ACT inhaler Inhale 1-2 puffs into the lungs every 6 (six) hours as needed for wheezing or shortness of breath. 01/20/23   Garrison, [place] N, FNP  benzonatate (TESSALON) 200 MG capsule Take 1 capsule (200 mg total) by mouth 3 (three) times daily as needed. 01/05/23   Radford Pax, NP  cyclobenzaprine (FLEXERIL) 10 MG tablet Take 1 tablet (10 mg total) by mouth 3 (three) times daily as needed for muscle spasms. Patient not taking: Reported on 06/15/2022 12/30/21   Tereso Newcomer, MD  ibuprofen (ADVIL) 600 MG tablet Take 1 tablet (600 mg total) by mouth every 6 (six) hours as needed for headache, mild pain or fever. Patient not taking: Reported on 06/15/2022 12/30/21   Tereso Newcomer, MD  medroxyPROGESTERone (DEPO-PROVERA) 150 MG/ML injection Inject 1 mL (150 mg total) into the muscle every 3 (three) months. 09/06/22   Anyanwu, Jethro Bastos, MD  medroxyPROGESTERone Acetate 150 MG/ML SUSY ADMINISTER 1 ML(150 MG) IN THE MUSCLE EVERY 3 MONTHS 09/06/22   Brock Bad, MD  montelukast (SINGULAIR) 10 MG tablet Take 1 tablet (10 mg total) by mouth at bedtime. 11/11/22   Valentino Nose, NP  ondansetron (ZOFRAN-ODT) 4 MG disintegrating tablet Take 1 tablet (4 mg total) by mouth every 8 (eight) hours as needed. 12/01/22   Jacalyn Lefevre, MD  promethazine-dextromethorphan (PROMETHAZINE-DM) 6.25-15 MG/5ML syrup Take 5 mLs by mouth 3 (three) times daily as needed. 01/20/23   Garrison, [place] N, FNP  ipratropium (ATROVENT) 0.03 % nasal spray Place 2 sprays  into both nostrils 2 (two) times daily. Patient not taking: Reported on 07/31/2018 06/21/18 11/13/18  Bing Neighbors, NP      Allergies    Patient has no known allergies.    Review of Systems   Review of Systems A 10 point review of systems was performed and is negative unless otherwise reported in HPI.  Physical Exam Updated Vital Signs BP 101/63   Pulse 76   Temp 98.4 F (36.9 C) (Oral)   Resp 16   SpO2 96%  Physical Exam General: Normal  appearing female, lying in bed.  HEENT: PERRLA, Sclera anicteric, MMM, trachea midline.  Cardiology: RRR, no murmurs/rubs/gallops. BL radial and DP pulses equal bilaterally.  Resp: Normal respiratory rate and effort. CTAB, no wheezes, rhonchi, crackles.  Abd: Soft, non-tender, non-distended. No rebound tenderness or guarding.  GU: Deferred. MSK: No peripheral edema or signs of trauma. Extremities without deformity or TTP. No cyanosis or clubbing. Skin: warm, dry. No rashes or lesions. Back: No CVA tenderness Neuro: A&Ox4, CNs II-XII grossly intact. MAEs. Sensation grossly intact.  Psych: Normal mood and affect.   ED Results / Procedures / Treatments   Labs (all labs ordered are listed, but only abnormal results are displayed) Labs Reviewed  BASIC METABOLIC PANEL - Abnormal; Notable for the following components:      Result Value   CO2 20 (*)    Calcium 8.7 (*)    All other components within normal limits  SARS CORONAVIRUS 2 BY RT PCR  CBC WITH DIFFERENTIAL/PLATELET  HCG, QUANTITATIVE, PREGNANCY    EKG None  Radiology No results found.  Procedures Procedures  {Document cardiac monitor, telemetry assessment procedure when appropriate:1}  Medications Ordered in ED Medications  acetaminophen (TYLENOL) tablet 1,000 mg (1,000 mg Oral Given 01/31/23 0919)  lactated ringers bolus 1,000 mL (0 mLs Intravenous Stopped 01/31/23 1343)  prochlorperazine (COMPAZINE) injection 10 mg (10 mg Intravenous Given 01/31/23 0919)  diphenhydrAMINE (BENADRYL) injection 25 mg (25 mg Intravenous Given 01/31/23 0920)    ED Course/ Medical Decision Making/ A&P                          Medical Decision Making Amount and/or Complexity of Data Reviewed Labs: ordered.  Risk OTC drugs. Prescription drug management.    This patient presents to the ED for concern of ***, this involves an extensive number of treatment options, and is a complaint that carries with it a high risk of complications and  morbidity.  I considered the following differential and admission for this acute, potentially life threatening condition.   MDM:    ***  Clinical Course as of 02/04/23 0003  Tue Jan 31, 2023  1355 Patient reevaluated.  She states she needs to go now to pick her child up from school.  She states her headache is now only a 3 out of 10.  She then informs me that she is also had a toothache for the last several days and wonders if this could be contributing.  She again denies any fevers or chills.  She denies any facial pain, ear ache, neck pain, facial swelling, throat swelling, difficulty breathing.  I will prescribe patient Augmentin twice daily x 7 days and instruct her to follow-up with a dentist to be evaluated to have a tooth extraction for possible dental infection.  Her headache is improved with a headache cocktail and without fevers chills or any red flag symptoms for headache I have low suspicion  for serious bacterial infection or life-threatening cause of headache.  She is very well-appearing and nontoxic.  Instruct patient to stay well-hydrated and take Tylenol ibuprofen at home.  Instructed to follow-up with PCP.  Given discharge instructions and return precautions, all questions answered to patient satisfaction. [HN]    Clinical Course User Index [HN] Loetta Rough, MD    Labs: I Ordered, and personally interpreted labs.  The pertinent results include:  ***  Imaging Studies ordered: I ordered imaging studies including CTH I independently visualized and interpreted imaging. I agree with the radiologist interpretation  Additional history obtained from ***.  External records from outside source obtained and reviewed including ***  Cardiac Monitoring: .The patient was maintained on a cardiac monitor.  I personally viewed and interpreted the cardiac monitored which showed an underlying rhythm of: ***  Reevaluation: After the interventions noted above, I reevaluated the patient and  found that they have :{resolved/improved/worsened:23923::"improved"}  Social Determinants of Health: .***  Disposition:  ***  Co morbidities that complicate the patient evaluation . Past Medical History:  Diagnosis Date  . Anemia   . Asthma   . Delivery of twins, both live 12/29/2021  . Preterm premature rupture of membranes (PPROM) delivered, current hospitalization 12/27/2021  . Preterm premature rupture of membranes (PPROM) with onset of labor after 24 hours of rupture in second trimester, antepartum 11/22/2021   Completed antibiotics and BMZ.  Pt left hospital AMA at 29 weeks due to childcare issues     Medicines Meds ordered this encounter  Medications  . acetaminophen (TYLENOL) tablet 1,000 mg  . lactated ringers bolus 1,000 mL  . prochlorperazine (COMPAZINE) injection 10 mg  . diphenhydrAMINE (BENADRYL) injection 25 mg  . amoxicillin-clavulanate (AUGMENTIN) 875-125 MG tablet    Sig: Take 1 tablet by mouth every 12 (twelve) hours.    Dispense:  14 tablet    Refill:  0    I have reviewed the patients home medicines and have made adjustments as needed  Problem List / ED Course: Problem List Items Addressed This Visit   None Visit Diagnoses     Toothache    -  Primary   Bad headache       Relevant Medications   acetaminophen (TYLENOL) tablet 1,000 mg (Completed)            {Document critical care time when appropriate:1} {Document review of labs and clinical decision tools ie heart score, Chads2Vasc2 etc:1}  {Document your independent review of radiology images, and any outside records:1} {Document your discussion with family members, caretakers, and with consultants:1} {Document social determinants of health affecting pt's care:1} {Document your decision making why or why not admission, treatments were needed:1}  This note was created using dictation software, which may contain spelling or grammatical errors.

## 2023-02-04 NOTE — ED Provider Notes (Signed)
[place] EMERGENCY DEPARTMENT AT [facility] Provider Note   CSN: [id number] Arrival date & time: 01/31/23  4098     History  Chief Complaint  Patient presents with   Back Pain   Headache    Carol Hendrix is a 22 y.o. female with PMH as listed below who presents with severe headache. Has been going on for several days but got worse last night. Pain rated 10/10. No falls/trauma. A/w nausea/vomiting, photophobia. Located behind bilateral eyes and on the top of her head. Had a headache like this one time before, presumably had mono but she is not sure. Hasn't taken anything for the pain today. Has tried excedrin, tylenol which didn't help. Denies fevers/chills. Has been sick all week coughing up mucous. Gradual onset.   Past Medical History:  Diagnosis Date   Anemia    Asthma    Delivery of twins, both live 12/29/2021   Preterm premature rupture of membranes (PPROM) delivered, current hospitalization 12/27/2021   Preterm premature rupture of membranes (PPROM) with onset of labor after 24 hours of rupture in second trimester, antepartum 11/22/2021   Completed antibiotics and BMZ.  Pt left hospital AMA at 29 weeks due to childcare issues       Home Medications Prior to Admission medications   Medication Sig Start Date End Date Taking? Authorizing Provider  amoxicillin-clavulanate (AUGMENTIN) 875-125 MG tablet Take 1 tablet by mouth every 12 (twelve) hours. 01/31/23  Yes Loetta Rough, MD  acetaminophen (TYLENOL) 500 MG tablet Take 1,000 mg by mouth every 6 (six) hours as needed for moderate pain. Patient not taking: Reported on 06/15/2022    [provider]  albuterol (VENTOLIN HFA) 108 (90 Base) MCG/ACT inhaler Inhale 1-2 puffs into the lungs every 6 (six) hours as needed for wheezing or shortness of breath. 11/11/22   Valentino Nose, NP  albuterol (VENTOLIN HFA) 108 (90 Base) MCG/ACT inhaler Inhale 1-2 puffs into the lungs every 6 (six) hours as needed for  wheezing or shortness of breath. 01/20/23   Garrison, [place] N, FNP  benzonatate (TESSALON) 200 MG capsule Take 1 capsule (200 mg total) by mouth 3 (three) times daily as needed. 01/05/23   Radford Pax, NP  cyclobenzaprine (FLEXERIL) 10 MG tablet Take 1 tablet (10 mg total) by mouth 3 (three) times daily as needed for muscle spasms. Patient not taking: Reported on 06/15/2022 12/30/21   Tereso Newcomer, MD  ibuprofen (ADVIL) 600 MG tablet Take 1 tablet (600 mg total) by mouth every 6 (six) hours as needed for headache, mild pain or fever. Patient not taking: Reported on 06/15/2022 12/30/21   Tereso Newcomer, MD  medroxyPROGESTERone (DEPO-PROVERA) 150 MG/ML injection Inject 1 mL (150 mg total) into the muscle every 3 (three) months. 09/06/22   Anyanwu, Jethro Bastos, MD  medroxyPROGESTERone Acetate 150 MG/ML SUSY ADMINISTER 1 ML(150 MG) IN THE MUSCLE EVERY 3 MONTHS 09/06/22   Brock Bad, MD  montelukast (SINGULAIR) 10 MG tablet Take 1 tablet (10 mg total) by mouth at bedtime. 11/11/22   Valentino Nose, NP  ondansetron (ZOFRAN-ODT) 4 MG disintegrating tablet Take 1 tablet (4 mg total) by mouth every 8 (eight) hours as needed. 12/01/22   Jacalyn Lefevre, MD  promethazine-dextromethorphan (PROMETHAZINE-DM) 6.25-15 MG/5ML syrup Take 5 mLs by mouth 3 (three) times daily as needed. 01/20/23   Garrison, [place] N, FNP  ipratropium (ATROVENT) 0.03 % nasal spray Place 2 sprays into both nostrils 2 (two) times daily. Patient not  taking: Reported on 07/31/2018 06/21/18 11/13/18  Bing Neighbors, NP      Allergies    Patient has no known allergies.    Review of Systems   Review of Systems A 10 point review of systems was performed and is negative unless otherwise reported in HPI.  Physical Exam Updated Vital Signs BP 101/63   Pulse 76   Temp 98.4 F (36.9 C) (Oral)   Resp 16   SpO2 96%  Physical Exam General: Tearful female, sitting in wheelchair.  HEENT: PERRLA, EOMI, Sclera anicteric, MMM, trachea  midline. Normal speech. Neck supple. NCAT. Clear oropharynx, no posterior oropharyngeal swelling. No submandibular TTP or neck swelling. No facial swelling. Cardiology: RRR, no murmurs/rubs/gallops.  Resp: Normal respiratory rate and effort. CTAB, no wheezes, rhonchi, crackles. No stridor.  Abd: Soft, non-tender, non-distended. No rebound tenderness or guarding.  GU: Deferred. MSK: No peripheral edema or signs of trauma.  Skin: warm, dry.  Neuro: A&Ox4, CNs II-XII grossly intact. 5/5 strength all extremities.. Sensation grossly intact.  Psych: Normal mood and affect.   ED Results / Procedures / Treatments   Labs (all labs ordered are listed, but only abnormal results are displayed) Labs Reviewed  BASIC METABOLIC PANEL - Abnormal; Notable for the following components:      Result Value   CO2 20 (*)    Calcium 8.7 (*)    All other components within normal limits  SARS CORONAVIRUS 2 BY RT PCR  CBC WITH DIFFERENTIAL/PLATELET  HCG, QUANTITATIVE, PREGNANCY    EKG None  Radiology No results found.  Procedures Procedures    Medications Ordered in ED Medications  acetaminophen (TYLENOL) tablet 1,000 mg (1,000 mg Oral Given 01/31/23 0919)  lactated ringers bolus 1,000 mL (0 mLs Intravenous Stopped 01/31/23 1343)  prochlorperazine (COMPAZINE) injection 10 mg (10 mg Intravenous Given 01/31/23 0919)  diphenhydrAMINE (BENADRYL) injection 25 mg (25 mg Intravenous Given 01/31/23 0920)    ED Course/ Medical Decision Making/ A&P                          Medical Decision Making Amount and/or Complexity of Data Reviewed Labs: ordered.  Risk OTC drugs. Prescription drug management.    This patient presents to the ED for concern of headache, this involves an extensive number of treatment options, and is a complaint that carries with it a high risk of complications and morbidity. Pt is tearful on initial exam but HDS and overall non-toxic appearing.  MDM:    Patient is overall very  well-appearing, HDS, non-toxic, afebrile.   Unlikely SAH: headache is non thunderclap Unlikely Subdural/epidural hematoma: no history of trauma, no anticoagulation. Unlikely Meningitis: afebrile, no meningismus. Unlikely Temporal arteritis: pt < 35 years old.  no tenderness in temporal area Unlikely Acute angle glaucoma: PERRLA, no eye pain. Unlikely Carbon Monoxide Poisoning: no other house members with similar symptoms.  Most likely 2/2 tension headache, migraine, or headache of non-emergent etiology. No focal neurological symptoms. Neuro exam is benign. Will treatment the patient symptomatically w/ headache cocktail and reassess.   Clinical Course as of 02/04/23 0003  Tue Jan 31, 2023  1355 Patient reevaluated.  She states she needs to go now to pick her child up from school.  She states her headache is now only a 3 out of 10.  She then informs me that she is also had a toothache for the last several days and wonders if this could be contributing.  She again denies  any fevers or chills.  She denies any facial pain, ear ache, neck pain, facial swelling, throat swelling, difficulty breathing. Several teeth in lower right jaw are sore on exam, with no abscess, bleeding, swelling intraorally. I will prescribe patient Augmentin twice daily x 7 days and instruct her to follow-up with a dentist to be evaluated to have a tooth extraction for possible dental infection.  Her headache is improved with a headache cocktail and without fevers chills or any red flag symptoms for headache I have low suspicion for serious bacterial infection or life-threatening cause of headache.  She is very well-appearing and nontoxic.  Instruct patient to stay well-hydrated and take Tylenol ibuprofen at home.  Instructed to follow-up with PCP.  Given discharge instructions and return precautions, all questions answered to patient satisfaction. [HN]    Clinical Course User Index [HN] Loetta Rough, MD    Labs: I Ordered,  and personally interpreted labs.  The pertinent results include: BMP, hCG, CBC, and COVID unremarkable  Additional history obtained from chart review.    Reevaluation: After the interventions noted above, I reevaluated the patient and found that they have :improved  Social Determinants of Health: Lives independently  Disposition:  DC w/ discharge instructions/return precautions. All questions answered to patient's satisfaction.    Co morbidities that complicate the patient evaluation  Past Medical History:  Diagnosis Date   Anemia    Asthma    Delivery of twins, both live 12/29/2021   Preterm premature rupture of membranes (PPROM) delivered, current hospitalization 12/27/2021   Preterm premature rupture of membranes (PPROM) with onset of labor after 24 hours of rupture in second trimester, antepartum 11/22/2021   Completed antibiotics and BMZ.  Pt left hospital AMA at 29 weeks due to childcare issues     Medicines Meds ordered this encounter  Medications   acetaminophen (TYLENOL) tablet 1,000 mg   lactated ringers bolus 1,000 mL   prochlorperazine (COMPAZINE) injection 10 mg   diphenhydrAMINE (BENADRYL) injection 25 mg   amoxicillin-clavulanate (AUGMENTIN) 875-125 MG tablet    Sig: Take 1 tablet by mouth every 12 (twelve) hours.    Dispense:  14 tablet    Refill:  0    I have reviewed the patients home medicines and have made adjustments as needed  Problem List / ED Course: Problem List Items Addressed This Visit   None Visit Diagnoses     Toothache    -  Primary   Bad headache       Relevant Medications   acetaminophen (TYLENOL) tablet 1,000 mg (Completed)                   This note was created using dictation software, which may contain spelling or grammatical errors.    Loetta Rough, MD 02/04/23 1153

## 2023-02-11 IMAGING — US US OB COMP LESS 14 WK
1 series · 14 of 28 positions shown · non-contrast
Comparison: 08/07/2021

CLINICAL DATA: Inconclusive viability

EXAM:
TWIN OBSTETRICAL ULTRASOUND <14 WKS
TECHNIQUE: Transabdominal ultrasound was performed for evaluation of the
gestation as well as the maternal uterus and adnexal regions.

[Series 1: us ob comp less 14 wks · 14 of 54 slices shown]
[im 2/54]
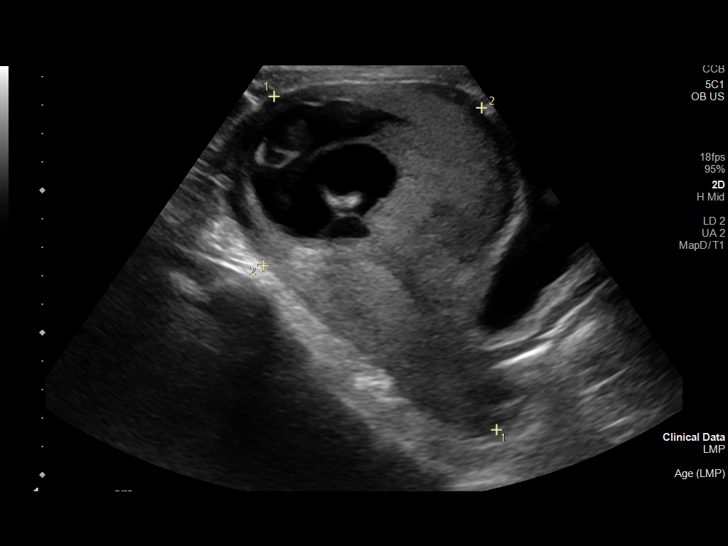
[im 6/54]
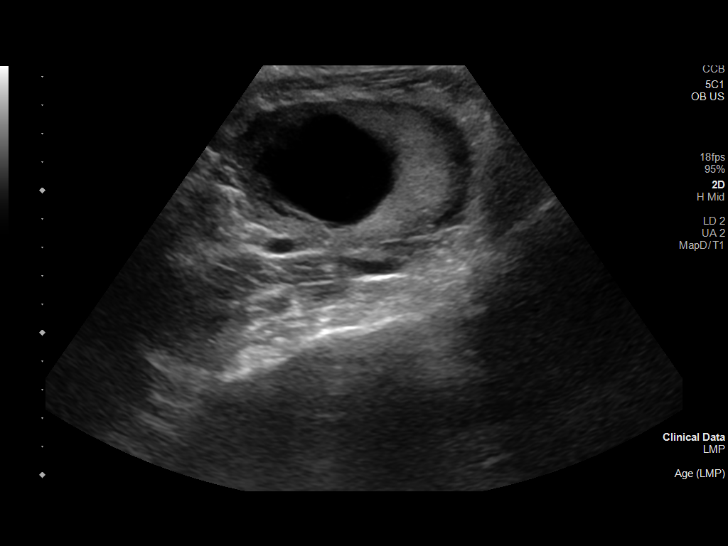
[im 10/54]
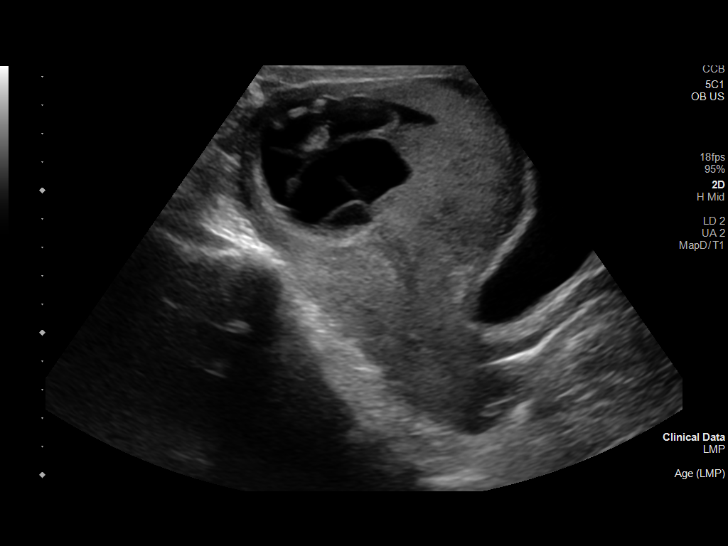
[im 14/54]
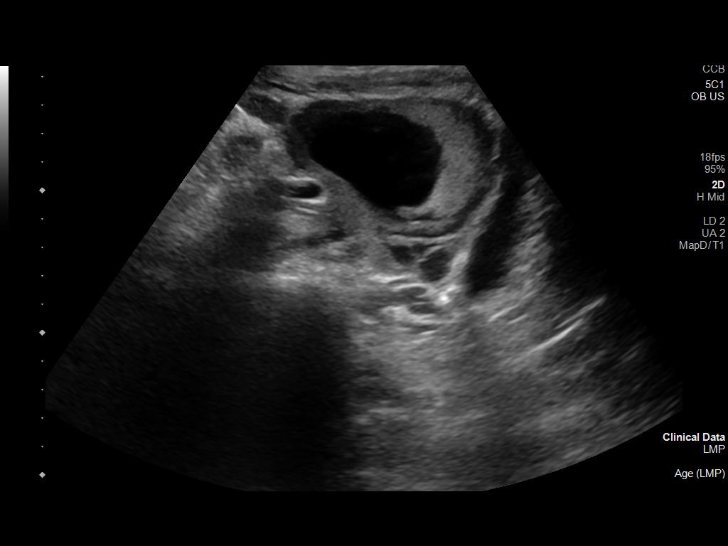
[im 18/54]
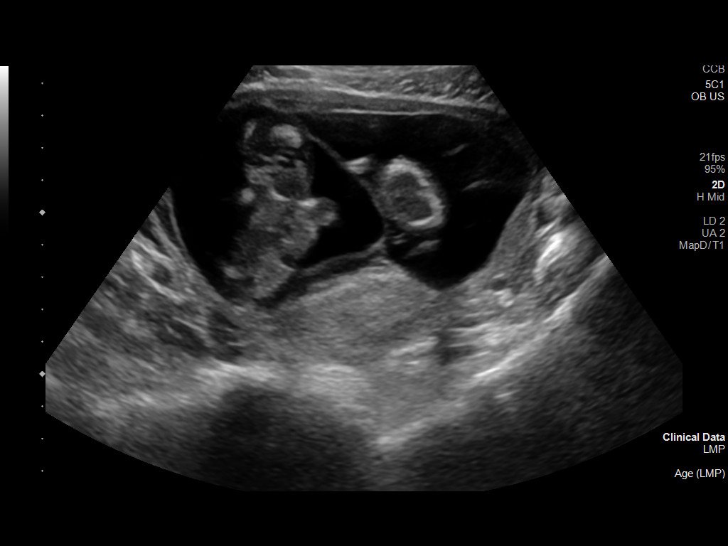
[im 22/54]
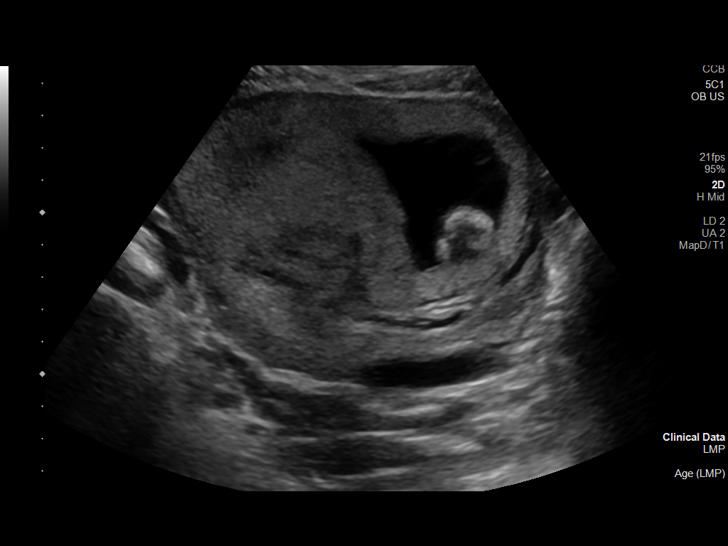
[im 26/54]
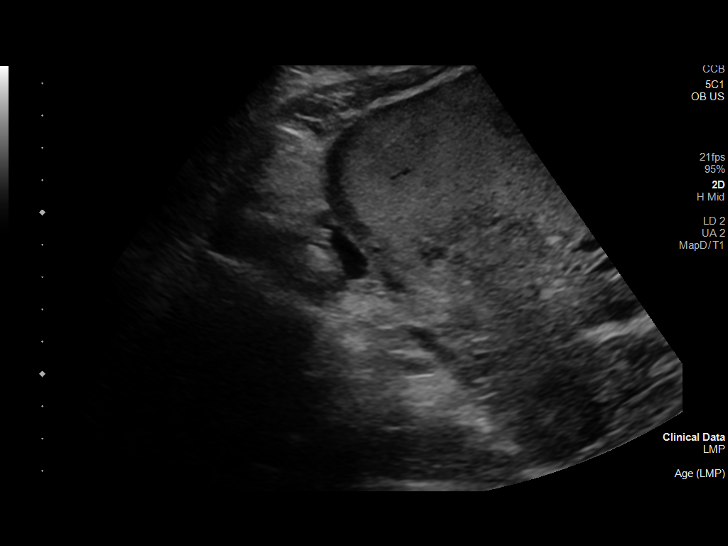
[im 30/54]
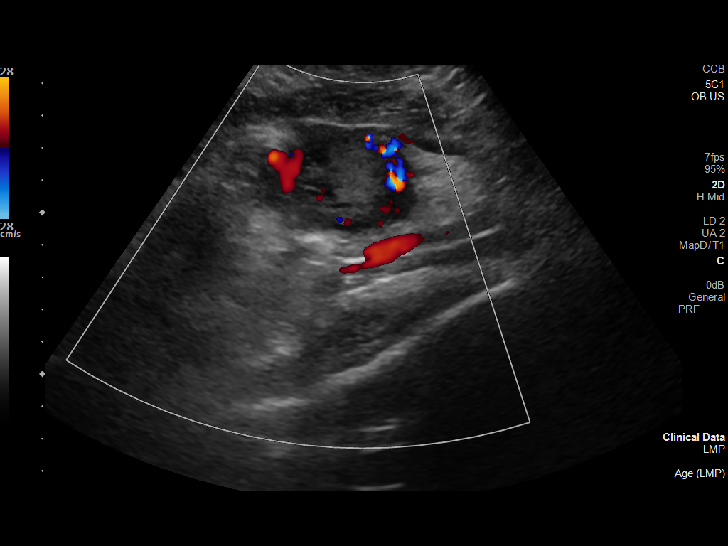
[im 34/54]
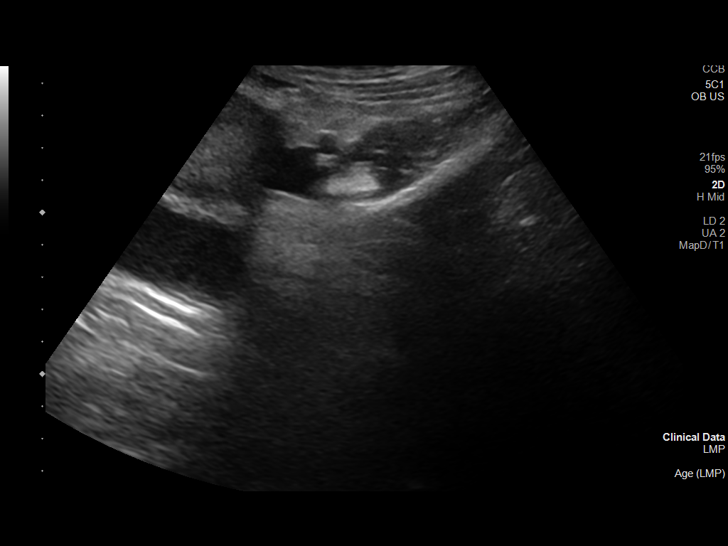
[im 38/54]
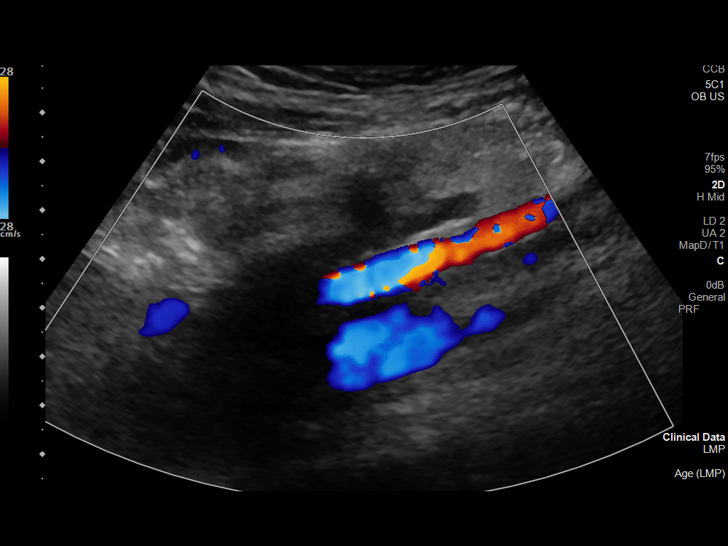
[im 42/54]
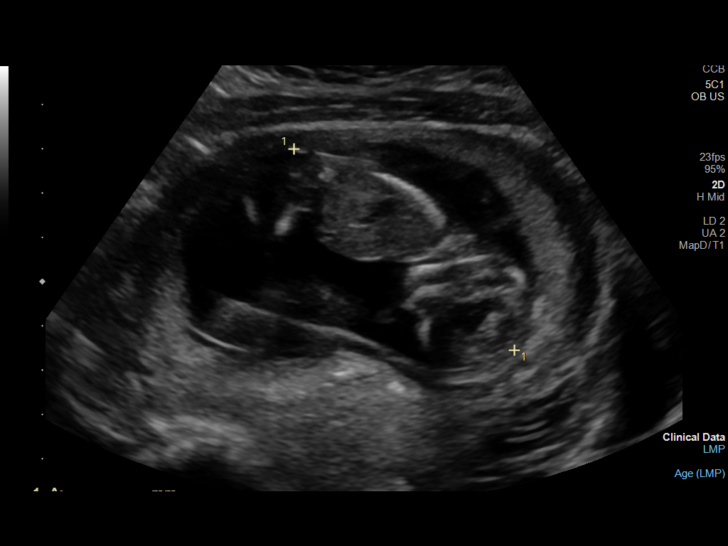
[im 46/54]
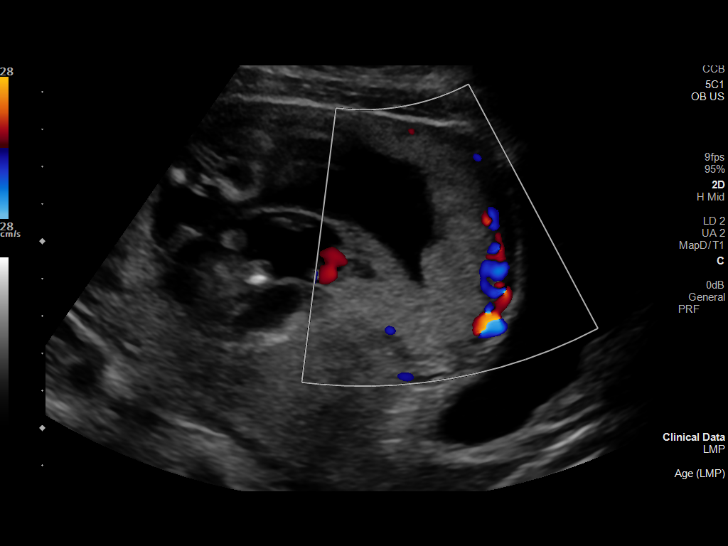
[im 50/54]
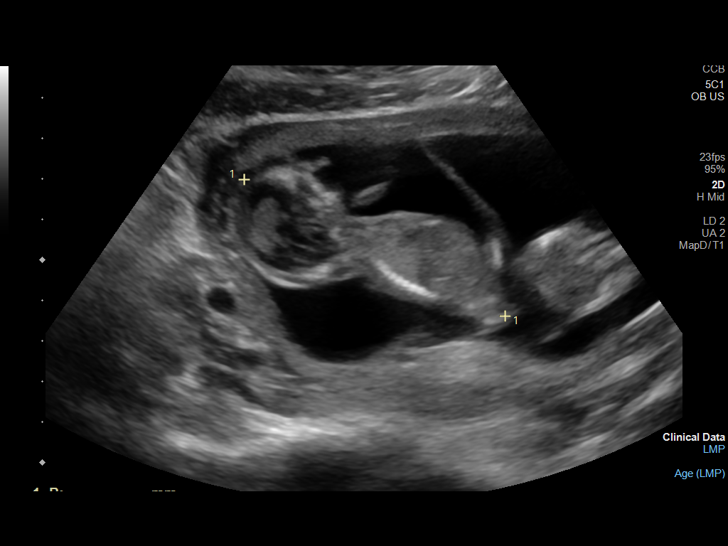
[im 54/54]
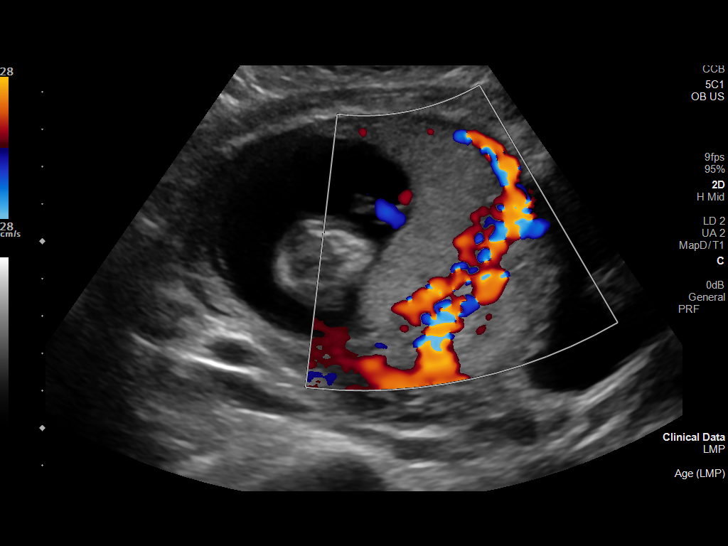

[14 of 28 positions shown; findings below may reference images not displayed]

FINDINGS: Number of IUPs:  2

Chorionicity/Amnionicity:  Dichorionic diamniotic

TWIN 1

Yolk sac:  Not visualized

Embryo:  Visualized

Cardiac Activity: Visualized

Heart Rate: 164 bpm

CRL:   69.05 mm   13 w 1 d                  US EDC: 02/20/2022

TWIN 2

Yolk sac:  Not Visualized.

Embryo:  Visualized.

Cardiac Activity: Visualized.

Heart Rate: 155 bpm

CRL:   68.27 mm   13 w 1 d                  US EDC: 02/20/2022

Subchorionic hemorrhage:  None visualized.

Maternal uterus/adnexae: Grossly normal.  Ovaries are nonvisualized.
IMPRESSION: Viable dichorionic diamniotic twin pregnancy as above. No specific
abnormality is seen

## 2023-02-24 DIAGNOSIS — Z419 Encounter for procedure for purposes other than remedying health state, unspecified: Secondary | ICD-10-CM | POA: Diagnosis not present

## 2023-03-26 DIAGNOSIS — Z419 Encounter for procedure for purposes other than remedying health state, unspecified: Secondary | ICD-10-CM | POA: Diagnosis not present

## 2023-04-09 IMAGING — US US MFM OB DETAIL+14 WK
1 series · 13 of 28 positions shown · non-contrast
Comparison: none

[Series 1: us mfm ob detail+14 wk · 13 of 184 slices shown]
[im 7/184]
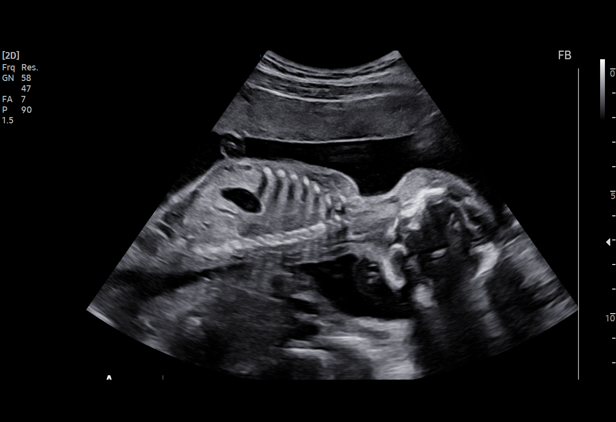
[im 21/184]
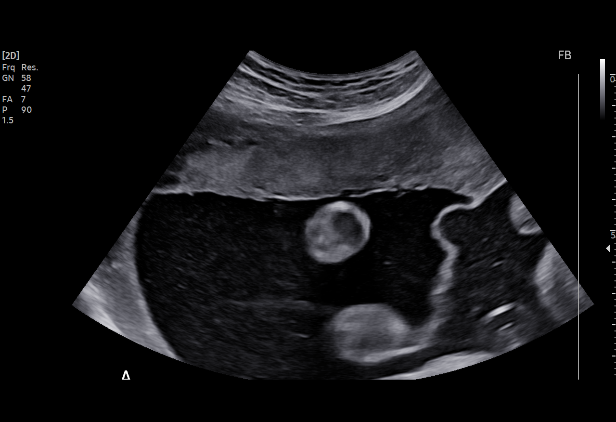
[im 34/184]
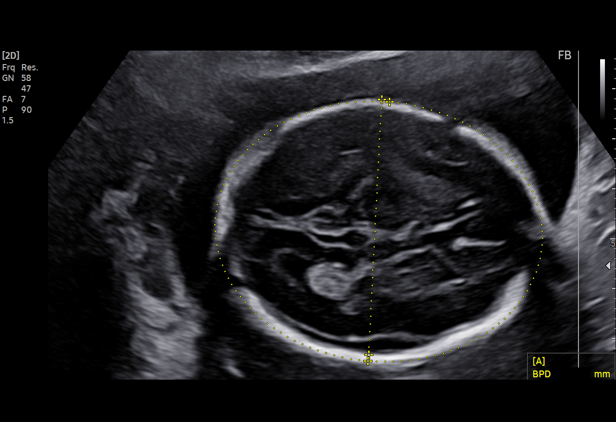
[im 48/184]
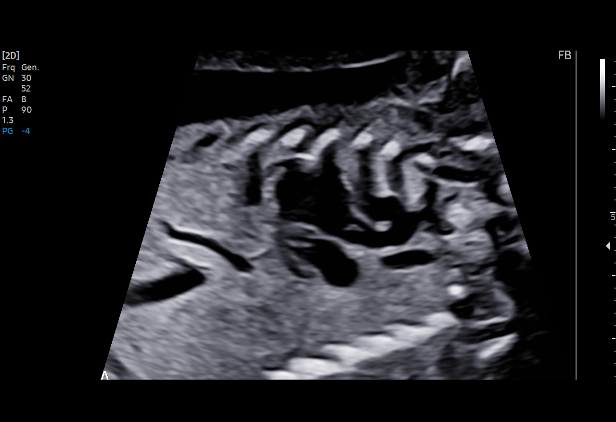
[im 62/184]
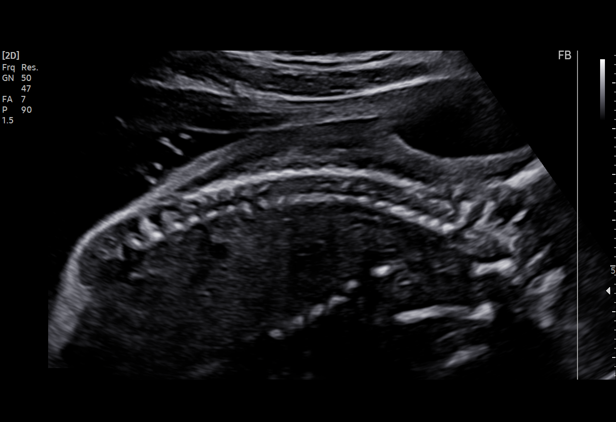
[im 75/184]
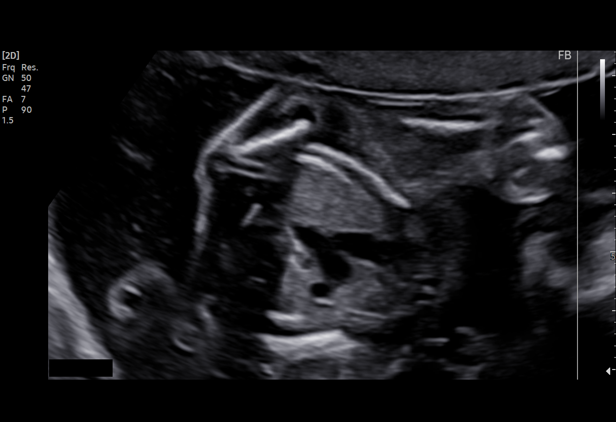
[im 95/184]
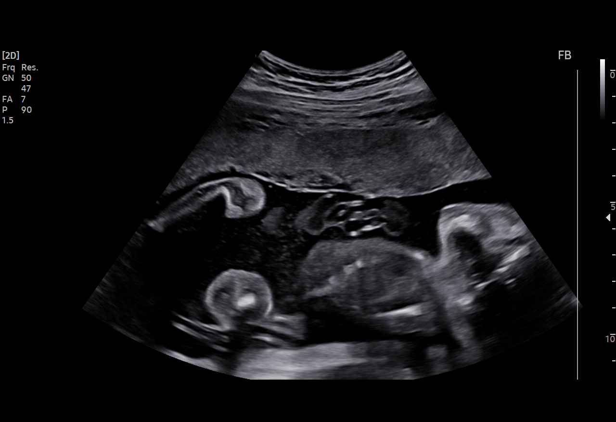
[im 109/184]
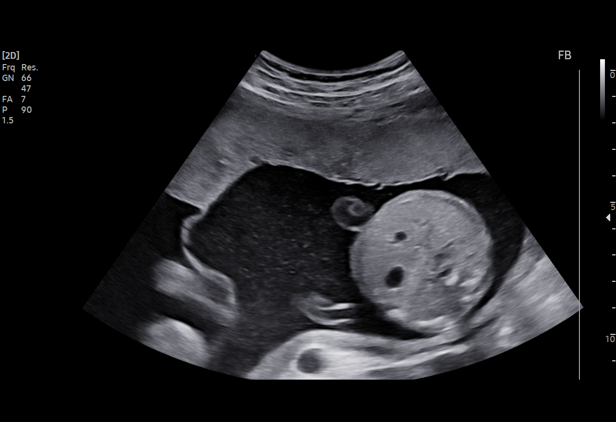
[im 123/184]
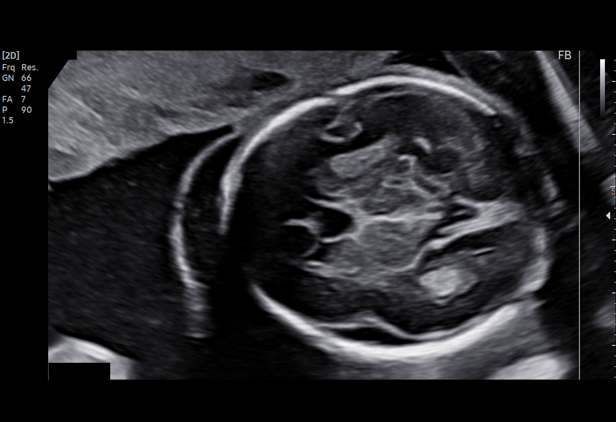
[im 136/184]
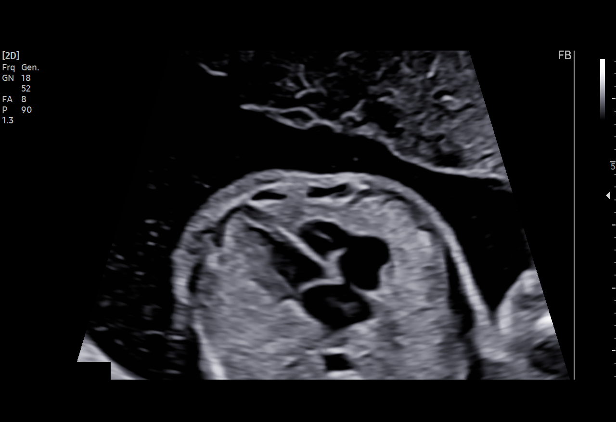
[im 150/184]
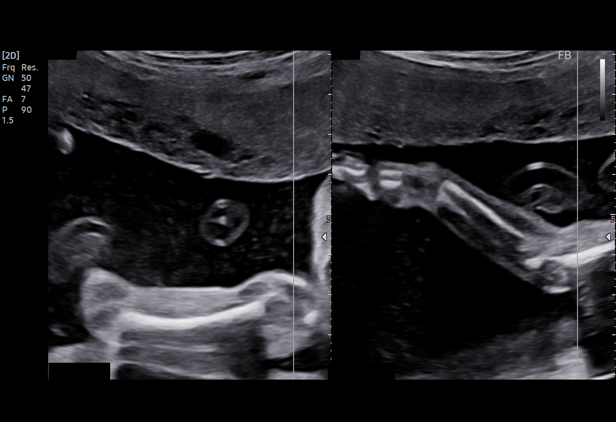
[im 163/184]
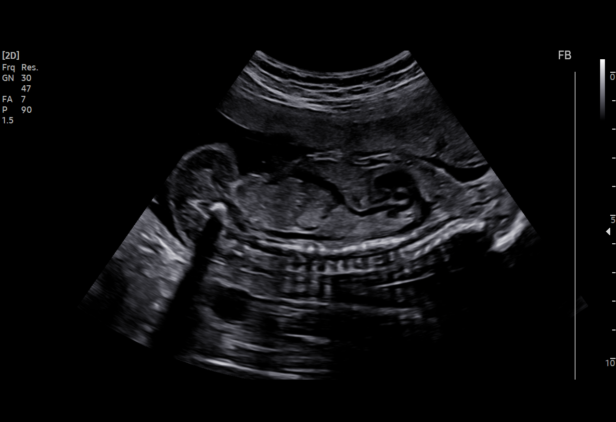
[im 177/184]
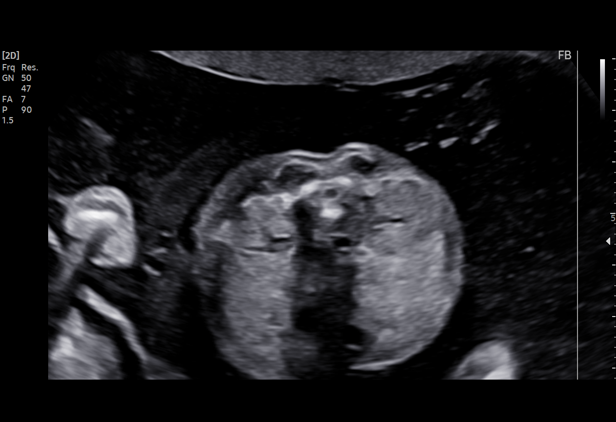

[13 of 28 positions shown; findings below may reference images not displayed]

2  US MFM OB DETAIL ADDL GEST            76811.02    MAXMED AKI
    +14 WK

Indications

 Twin pregnancy, di/di, second trimester
 Encounter for antenatal screening for
 malformations
 21 weeks gestation of pregnancy
 LR NIPS Males/ Neg Horizon/ Neg AFP
Fetal Evaluation (Fetus A)

 Num Of Fetuses:         2
 Fetal Heart Rate(bpm):  148
 Cardiac Activity:       Observed
 Fetal Lie:              Maternal right side
 Presentation:           Cephalic
 Placenta:               Anterior
 P. Cord Insertion:      Visualized, central
 Membrane Desc:      Dividing Membrane seen - Dichorionic.

 Amniotic Fluid
 AFI FV:      Within normal limits

                             Largest Pocket(cm)

Biometry (Fetus A)
 BPD:     50.86  mm     G. Age:  21w 3d         47  %    CI:        76.29   %    70 - 86
                                                         FL/HC:      19.8   %    15.9 -
 HC:    184.52   mm     G. Age:  20w 6d         16  %    HC/AC:      1.08        1.06 -
 AC:    170.33   mm     G. Age:  22w 0d         62  %    FL/BPD:     71.9   %
 FL:      36.59  mm     G. Age:  21w 4d         47  %    FL/AC:      21.5   %    20 - 24
 HUM:      33.7  mm     G. Age:  21w 3d         48  %
 CER:      20.7  mm     G. Age:  19w 6d         14  %

 LV:        6.2  mm
 CM:        6.2  mm

 Est. FW:     445  gm           1 lb     60  %     FW Discordancy         3  %
OB History

 Gravidity:    3         Term:   2
 Living:       2
Gestational Age (Fetus A)

 LMP:           21w 3d        Date:  05/15/21                  EDD:   02/19/22
 U/S Today:     21w 3d                                        EDD:   02/19/22
 Best:          21w 3d     Det. By:  LMP  (05/15/21)          EDD:   02/19/22
Anatomy (Fetus A)

 Cranium:               Appears normal         Aortic Arch:            Appears normal
 Cavum:                 Appears normal         Ductal Arch:            Appears normal
 Ventricles:            Appears normal         Diaphragm:              Appears normal
 Choroid Plexus:        Appears normal         Stomach:                Appears normal, left
                                                                       sided
 Cerebellum:            Appears normal         Abdomen:                Appears normal
 Posterior Fossa:       Appears normal         Abdominal Wall:         Appears nml (cord
                                                                       insert, abd wall)
 Nuchal Fold:           Not applicable (>20    Cord Vessels:           Appears normal (3
                        wks GA)                                        vessel cord)
 Face:                  Appears normal         Kidneys:                Appear normal
                        (orbits and profile)
 Lips:                  Appears normal         Bladder:                Appears normal
 Thoracic:              Appears normal         Spine:                  Appears normal
 Heart:                 Appears normal         Upper Extremities:      Appears normal
                        (4CH, axis, and
                        situs)
 RVOT:                  Appears normal         Lower Extremities:      Appears normal
 LVOT:                  Appears normal

 Other:  Fetus appears to be a male. Nasal bone visualized. Lenses
         visualized. VC, 3VV and 3VTV visualized. Hands and feet visualized.

Fetal Evaluation (Fetus B)

 Num Of Fetuses:         2
 Fetal Heart Rate(bpm):  141
 Cardiac Activity:       Observed
 Fetal Lie:              Maternal left side
 Presentation:           Cephalic
 Placenta:               Anterior
 P. Cord Insertion:      Visualized, central
 Membrane Desc:      Dividing Membrane seen - Dichorionic.
 Amniotic Fluid
 AFI FV:      Within normal limits

                             Largest Pocket(cm)

Biometry (Fetus B)

 BPD:     51.24  mm     G. Age:  21w 4d         52  %    CI:        77.93   %    70 - 86
                                                         FL/HC:      19.8   %    15.9 -
 HC:    183.67   mm     G. Age:  20w 5d         14  %    HC/AC:      1.05        1.06 -
 AC:    174.78   mm     G. Age:  22w 3d         74  %    FL/BPD:     71.1   %
 FL:      36.41  mm     G. Age:  21w 4d         45  %    FL/AC:      20.8   %    20 - 24
 HUM:      35.2  mm     G. Age:  22w 1d         65  %
 CER:      21.2  mm     G. Age:  20w 1d         22  %
 LV:        5.3  mm
 CM:        4.6  mm
 Est. FW:     458  gm           1 lb     69  %     FW Discordancy      0 \ 3 %
Gestational Age (Fetus B)

 LMP:           21w 3d        Date:  05/15/21                  EDD:   02/19/22
 U/S Today:     21w 4d                                        EDD:   02/18/22
 Best:          21w 3d     Det. By:  LMP  (05/15/21)          EDD:   02/19/22
Anatomy (Fetus B)

 Cranium:               Appears normal         Aortic Arch:            Appears normal
 Cavum:                 Appears normal         Ductal Arch:            Appears normal
 Ventricles:            Appears normal         Diaphragm:              Appears normal
 Choroid Plexus:        Appears normal         Stomach:                Appears normal, left
                                                                       sided
 Cerebellum:            Appears normal         Abdomen:                Appears normal
 Posterior Fossa:       Appears normal         Abdominal Wall:         Appears nml (cord
                                                                       insert, abd wall)
 Nuchal Fold:           Not applicable (>20    Cord Vessels:           Appears normal (3
                        wks GA)                                        vessel cord)
 Face:                  Appears normal         Kidneys:                Appear normal
                        (orbits and profile)
 Lips:                  Appears normal         Bladder:                Appears normal
 Thoracic:              Appears normal         Spine:                  Limited views
                                                                       appear normal
 Heart:                 Appears normal         Upper Extremities:      Appears normal
                        (4CH, axis, and
                        situs)
 RVOT:                  Appears normal         Lower Extremities:      Appears normal
 LVOT:                  Appears normal

 Other:  Fetus appears to be a male. Nasal bone visualized. Lenses
         visualized. VC, 3VV and 3VTV visualized. Hands and feet visualized.
Cervix Uterus Adnexa

 Cervix
 Length:            4.3  cm.
 Normal appearance by transabdominal scan.
 Adnexa
 No abnormality visualized.
Impression
 We confirmed with dichorionic-diamniotic twin pregnancy.
 Twin A: Maternal right, anterior placenta, male fetus, cephalic
 presentation.  Fetal biometry is consistent with the previously
 established dates.  No markers of aneuploidies or fetal
 structural defects are seen.  Amniotic fluid is normal and
 good fetal activity seen.
 Twin B: Maternal left, anterior placenta, cephalic
 presentation, male fetus. Fetal biometry is consistent with the
 previously established dates.  No markers of aneuploidies or
 fetal structural defects are seen.  Amniotic fluid is normal and
 good fetal activity seen.
 Growth discordancy: 3% (normal).
 xxxxxxxxxxxxxxxxxxxxxxxxxxxxxxxxxxxxxxxxxxxxxxxxxx
 Consultation (see [REDACTED] )
 [facility] the [facility] She is G3 12WW2 at 21w 3d gestation
 with twin pregnancy and is here for fetal anatomy scan.  Her
 pregnancy is well dated by LMP date consistent with 12-week
 ultrasound.  Dichorionic-diamniotic twin pregnancy was
 confirmed at 12-week ultrasound.
 On cell-free fetal DNA screening, the risks of fetal
 aneuploidies are not increased.  MSAFP screening showed
 low risk for open neural tube defects.
 Patient has anemia and takes iron supplements.  Patient
 takes low-dose aspirin prophylaxis.
 Obstetric history significant for 2 term vaginal deliveries.
 Twin pregnancy
  I explained the significance of chorionicity and its
 implications. I explained the possible complications
 associated with twin pregnancy that include preterm
 labor/delivery (most common), fetal growth restriction of one
 or both twins, malpresentations and increased cesarean
 delivery rate, postpartum hemorrhage. I also informed her
 that maternal complications including gestational diabetes
 and gestational hypertension/preeclampsia are more
 common.
 I encouraged her to take iron supplements to correct anemia
 so that blood transfusion can be avoided.  If she is unable to
 tolerate oral iron, intravenous iron transfusions may be
 considered.

 I discussed the mode of delivery that is based on the
 presentations.  If both have Vx/Vx or Vx/non-vertex
 presentations, vaginal delivery may be considered. In Vx/non-
 vx, vaginal delivery followed by internal podalic version of
 second twin will achieve vaginal delivery. In non-vx first twin
 presentation, cesarean delivery will be performed.

 I emphasized the importance of weight gain (24 lbs by 24
 weeks) to improve fetal weight and decrease the chances of
 preterm delivery.
 Prophylactic low-dose aspirin delays or prevents
 preeclampsia. Twin pregnancy is at high risk for
 preeclampsia. I recommended that she continue aspirin till
 delivery.

 Recommendations
Recommendations

 -An appointment was made for her to return in 4 weeks for
 fetal growth assessment (and check spine of twin B).
 -Fetal growth assessments every 4 weeks.
 -BPP at 36- and 37-weeks gestation.
 -Delivery at 38 weeks gestation.
                Deer, Gui

## 2023-04-18 ENCOUNTER — Telehealth: Payer: Medicaid Other | Admitting: Physician Assistant

## 2023-04-18 DIAGNOSIS — N760 Acute vaginitis: Secondary | ICD-10-CM | POA: Diagnosis not present

## 2023-04-18 DIAGNOSIS — B9689 Other specified bacterial agents as the cause of diseases classified elsewhere: Secondary | ICD-10-CM

## 2023-04-18 MED ORDER — METRONIDAZOLE 500 MG PO TABS
500.0000 mg | ORAL_TABLET | Freq: Two times a day (BID) | ORAL | 0 refills | Status: DC
Start: 2023-04-18 — End: 2023-05-06

## 2023-04-18 NOTE — Progress Notes (Signed)

## 2023-04-18 NOTE — Progress Notes (Signed)
I have spent 5 minutes in review of e-visit questionnaire, review and updating patient chart, medical decision making and response to patient.   Mia Milan Cody Jacklynn Dehaas, PA-C    

## 2023-04-22 ENCOUNTER — Emergency Department (HOSPITAL_COMMUNITY): Payer: Medicaid Other

## 2023-04-22 ENCOUNTER — Emergency Department (HOSPITAL_COMMUNITY)
Admission: EM | Admit: 2023-04-22 | Discharge: 2023-04-22 | Disposition: A | Discharge status: Home / Self Care | Payer: Medicaid Other | Attending: Emergency Medicine | Admitting: Emergency Medicine

## 2023-04-22 ENCOUNTER — Other Ambulatory Visit: Payer: Self-pay

## 2023-04-22 ENCOUNTER — Encounter (HOSPITAL_COMMUNITY): Payer: Self-pay

## 2023-04-22 DIAGNOSIS — F17200 Nicotine dependence, unspecified, uncomplicated: Secondary | ICD-10-CM | POA: Insufficient documentation

## 2023-04-22 DIAGNOSIS — R0602 Shortness of breath: Secondary | ICD-10-CM | POA: Diagnosis not present

## 2023-04-22 DIAGNOSIS — J4521 Mild intermittent asthma with (acute) exacerbation: Secondary | ICD-10-CM | POA: Diagnosis not present

## 2023-04-22 DIAGNOSIS — B349 Viral infection, unspecified: Secondary | ICD-10-CM

## 2023-04-22 MED ORDER — IPRATROPIUM BROMIDE 0.02 % IN SOLN
0.5000 mg | Freq: Once | RESPIRATORY_TRACT | Status: AC
  Administered 2023-04-22: 0.5 mg via RESPIRATORY_TRACT
  Filled 2023-04-22: qty 2.5

## 2023-04-22 MED ORDER — METHYLPREDNISOLONE SODIUM SUCC 125 MG IJ SOLR: 125.0000 mg | Freq: Once | INTRAMUSCULAR | Status: DC

## 2023-04-22 MED ORDER — ALBUTEROL SULFATE (2.5 MG/3ML) 0.083% IN NEBU
10.0000 mg/h | INHALATION_SOLUTION | Freq: Once | RESPIRATORY_TRACT | Status: AC
  Administered 2023-04-22: 10 mg/h via RESPIRATORY_TRACT
  Filled 2023-04-22: qty 12

## 2023-04-22 MED ORDER — BENZONATATE 200 MG PO CAPS
200.0000 mg | ORAL_CAPSULE | Freq: Three times a day (TID) | ORAL | 0 refills | Status: DC | PRN
Start: 2023-04-22 — End: 2023-06-23

## 2023-04-22 MED ORDER — PREDNISONE 20 MG PO TABS
60.0000 mg | ORAL_TABLET | Freq: Once | ORAL | Status: AC
  Administered 2023-04-22: 60 mg via ORAL
  Filled 2023-04-22: qty 3

## 2023-04-22 MED ORDER — PREDNISONE 20 MG PO TABS: ORAL_TABLET | ORAL | 0 refills | Status: DC

## 2023-04-22 NOTE — ED Triage Notes (Signed)
Pt presents with a 3 day hx of ShOB and productive cough. Pt denies fever, aches, or N/V/D. Pt with hx of asthma and uses albuterol MDI at home.

## 2023-04-22 NOTE — ED Provider Notes (Signed)
[place] EMERGENCY DEPARTMENT AT [facility] Provider Note   CSN: [id number] Arrival date & time: 04/22/23  1438     History  Chief Complaint  Patient presents with   Shortness of Breath    Carol Hendrix is a 22 y.o. female.  The history is provided by the patient and medical records. No language interpreter was used.  Shortness of Breath    22 year old female significant history of asthma, tobacco use presented to ED with complaints of shortness of breath.  Patient report for the past several weeks she has had intermittent worsening shortness of breath and wheezing for which she called EMS to come out to give a breathing treatment with some relief.  Since yesterday she endorsed progressive worsening shortness of breath as well as wheezing and having to use her rescue inhaler hourly as well as her nebulizer.  She does not endorse any fever or chills no runny nose or sneezing but she does endorse some congestion.  Denies any nausea vomiting or diarrhea.  No productive cough or hemoptysis.  No prior history of PE or DVT.  She attributed her increased shortness of breath due to being in her house.  She felt she can breathe better when she is outside.    Home Medications Prior to Admission medications   Medication Sig Start Date End Date Taking? Authorizing Provider  acetaminophen (TYLENOL) 500 MG tablet Take 1,000 mg by mouth every 6 (six) hours as needed for moderate pain. Patient not taking: Reported on 06/15/2022    [provider]  albuterol (VENTOLIN HFA) 108 (90 Base) MCG/ACT inhaler Inhale 1-2 puffs into the lungs every 6 (six) hours as needed for wheezing or shortness of breath. 11/11/22   Valentino Nose, NP  albuterol (VENTOLIN HFA) 108 (90 Base) MCG/ACT inhaler Inhale 1-2 puffs into the lungs every 6 (six) hours as needed for wheezing or shortness of breath. 01/20/23   Garrison, [place] N, FNP  benzonatate (TESSALON) 200 MG capsule Take 1 capsule (200 mg  total) by mouth 3 (three) times daily as needed. 01/05/23   Radford Pax, NP  cyclobenzaprine (FLEXERIL) 10 MG tablet Take 1 tablet (10 mg total) by mouth 3 (three) times daily as needed for muscle spasms. Patient not taking: Reported on 06/15/2022 12/30/21   Tereso Newcomer, MD  ibuprofen (ADVIL) 600 MG tablet Take 1 tablet (600 mg total) by mouth every 6 (six) hours as needed for headache, mild pain or fever. Patient not taking: Reported on 06/15/2022 12/30/21   Tereso Newcomer, MD  medroxyPROGESTERone (DEPO-PROVERA) 150 MG/ML injection Inject 1 mL (150 mg total) into the muscle every 3 (three) months. 09/06/22   Anyanwu, Jethro Bastos, MD  medroxyPROGESTERone Acetate 150 MG/ML SUSY ADMINISTER 1 ML(150 MG) IN THE MUSCLE EVERY 3 MONTHS 09/06/22   Brock Bad, MD  metroNIDAZOLE (FLAGYL) 500 MG tablet Take 1 tablet (500 mg total) by mouth 2 (two) times daily. 04/18/23   Waldon Merl, PA-C  montelukast (SINGULAIR) 10 MG tablet Take 1 tablet (10 mg total) by mouth at bedtime. 11/11/22   Valentino Nose, NP  ondansetron (ZOFRAN-ODT) 4 MG disintegrating tablet Take 1 tablet (4 mg total) by mouth every 8 (eight) hours as needed. 12/01/22   Jacalyn Lefevre, MD  ipratropium (ATROVENT) 0.03 % nasal spray Place 2 sprays into both nostrils 2 (two) times daily. Patient not taking: Reported on 07/31/2018 06/21/18 11/13/18  Bing Neighbors, NP      Allergies  Patient has no known allergies.    Review of Systems   Review of Systems  Respiratory:  Positive for shortness of breath.   All other systems reviewed and are negative.   Physical Exam Updated Vital Signs BP 117/79 (BP Location: Right Arm)   Pulse 84   Temp 98.5 F (36.9 C) (Oral)   Resp 18   Ht 5\' 5"  (1.651 m)   Wt 77.1 kg   LMP 04/11/2023   SpO2 96%   Breastfeeding No   BMI 28.29 kg/m  Physical Exam Vitals and nursing note reviewed.  Constitutional:      General: She is not in acute distress.    Appearance: She is  well-developed.  HENT:     Head: Atraumatic.  Eyes:     Conjunctiva/sclera: Conjunctivae normal.  Cardiovascular:     Rate and Rhythm: Normal rate and regular rhythm.  Pulmonary:     Effort: Pulmonary effort is normal.     Breath sounds: Decreased breath sounds and wheezing present. No rhonchi or rales.  Chest:     Chest wall: No tenderness.  Musculoskeletal:     Cervical back: Neck supple.     Right lower leg: No tenderness.     Left lower leg: No tenderness.  Skin:    Findings: No rash.  Neurological:     Mental Status: She is alert.  Psychiatric:        Mood and Affect: Mood normal.     ED Results / Procedures / Treatments   Labs (all labs ordered are listed, but only abnormal results are displayed) Labs Reviewed - No data to display  EKG None  Radiology DG Chest 2 View Result Date: 04/22/2023 CLINICAL DATA:  Shortness of breath EXAM: CHEST - 2 VIEW COMPARISON:  12/01/2022 FINDINGS: The heart size and mediastinal contours are within normal limits. Both lungs are clear. The visualized skeletal structures are unremarkable. IMPRESSION: No active cardiopulmonary disease. Electronically Signed   By: Duanne Guess D.O.   On: 04/22/2023 16:14    Procedures Procedures    Medications Ordered in ED Medications  albuterol (PROVENTIL) (2.5 MG/3ML) 0.083% nebulizer solution (10 mg/hr Nebulization Given 04/22/23 1530)  ipratropium (ATROVENT) nebulizer solution 0.5 mg (0.5 mg Nebulization Given 04/22/23 1531)  predniSONE (DELTASONE) tablet 60 mg (60 mg Oral Given 04/22/23 1715)    ED Course/ Medical Decision Making/ A&P                                 Medical Decision Making Amount and/or Complexity of Data Reviewed Radiology: ordered.  Risk Prescription drug management.   BP 117/79 (BP Location: Right Arm)   Pulse 84   Temp 98.5 F (36.9 C) (Oral)   Resp 18   Ht 5\' 5"  (1.651 m)   Wt 77.1 kg   LMP 04/11/2023   SpO2 96%   Breastfeeding No   BMI 28.29  kg/m   80:79 PM  22 year old female significant history of asthma, tobacco use presented to ED with complaints of shortness of breath.  Patient report for the past several weeks she has had intermittent worsening shortness of breath and wheezing for which she called EMS to come out to give a breathing treatment with some relief.  Since yesterday she endorsed progressive worsening shortness of breath as well as wheezing and having to use her rescue inhaler hourly as well as her nebulizer.  She does not endorse any fever  or chills no runny nose or sneezing but she does endorse some congestion.  Denies any nausea vomiting or diarrhea.  No productive cough or hemoptysis.  No prior history of PE or DVT.  She attributed her increased shortness of breath due to being in her house.  She felt she can breathe better when she is outside.    On exam patient does have expiratory wheezes as well as decreased breath sounds but no overt rhonchi or rales heard.  No peripheral edema noted.  Vital signs overall reassuring no fever no hypoxia.  Symptoms suggestive of an asthma exacerbation.  She does not have any significant infectious symptoms.  She is overall well-appearing.  Will provide treatment.  Chest x-ray independently viewed interpreted by me and overall reassuring.  Agree with radiology interpretation.  After receiving breathing treatment and steroids, on reassessment, improvement of symptoms.  When ambulating, her O2 sats maintained at 98% on room air.  At this time patient is stable for discharge.  Will discharge home with prednisone course.  Return precaution given.        Final Clinical Impression(s) / ED Diagnoses Final diagnoses:  Mild intermittent asthma with exacerbation    Rx / DC Orders ED Discharge Orders          Ordered    benzonatate (TESSALON) 200 MG capsule  3 times daily PRN        04/22/23 1758    predniSONE (DELTASONE) 20 MG tablet        04/22/23 1758               Fayrene Helper, PA-C 04/22/23 1758    Theresia Lo, [place] K, DO 04/22/23 1952

## 2023-04-22 NOTE — ED Notes (Signed)
Pulse oximetry while ambulating pt. Remained 98-100%.

## 2023-04-26 DIAGNOSIS — Z419 Encounter for procedure for purposes other than remedying health state, unspecified: Secondary | ICD-10-CM | POA: Diagnosis not present

## 2023-04-28 ENCOUNTER — Other Ambulatory Visit (HOSPITAL_BASED_OUTPATIENT_CLINIC_OR_DEPARTMENT_OTHER): Payer: Self-pay

## 2023-05-05 ENCOUNTER — Encounter (HOSPITAL_COMMUNITY): Payer: Self-pay | Admitting: Emergency Medicine

## 2023-05-05 ENCOUNTER — Inpatient Hospital Stay (HOSPITAL_COMMUNITY)
Admission: EM | Admit: 2023-05-05 | Discharge: 2023-05-07 | DRG: 203 | Disposition: A | Discharge status: Home / Self Care | Payer: Medicaid Other | Attending: Internal Medicine | Admitting: Internal Medicine

## 2023-05-05 ENCOUNTER — Other Ambulatory Visit: Payer: Self-pay

## 2023-05-05 ENCOUNTER — Emergency Department (HOSPITAL_COMMUNITY): Payer: Medicaid Other

## 2023-05-05 DIAGNOSIS — R000 Tachycardia, unspecified: Secondary | ICD-10-CM | POA: Diagnosis not present

## 2023-05-05 DIAGNOSIS — R112 Nausea with vomiting, unspecified: Secondary | ICD-10-CM | POA: Diagnosis present

## 2023-05-05 DIAGNOSIS — Z79899 Other long term (current) drug therapy: Secondary | ICD-10-CM

## 2023-05-05 DIAGNOSIS — Z23 Encounter for immunization: Secondary | ICD-10-CM

## 2023-05-05 DIAGNOSIS — J4541 Moderate persistent asthma with (acute) exacerbation: Principal | ICD-10-CM | POA: Diagnosis present

## 2023-05-05 DIAGNOSIS — R059 Cough, unspecified: Secondary | ICD-10-CM | POA: Diagnosis not present

## 2023-05-05 DIAGNOSIS — R0602 Shortness of breath: Secondary | ICD-10-CM | POA: Diagnosis not present

## 2023-05-05 DIAGNOSIS — J45901 Unspecified asthma with (acute) exacerbation: Secondary | ICD-10-CM | POA: Diagnosis not present

## 2023-05-05 DIAGNOSIS — R062 Wheezing: Secondary | ICD-10-CM | POA: Diagnosis not present

## 2023-05-05 LAB — BASIC METABOLIC PANEL
Anion gap: 14 (ref 5–15)
BUN: 7 mg/dL (ref 6–20)
CO2: 19 mmol/L — ABNORMAL LOW (ref 22–32)
Calcium: 8.9 mg/dL (ref 8.9–10.3)
Chloride: 103 mmol/L (ref 98–111)
Creatinine, Ser: 0.77 mg/dL (ref 0.44–1.00)
GFR, Estimated: >60 mL/min (ref >60)
Glucose, Bld: 93 mg/dL (ref 70–99)
Potassium: 3.9 mmol/L (ref 3.5–5.1)
Sodium: 136 mmol/L (ref 135–145)

## 2023-05-05 LAB — CBC WITH DIFFERENTIAL/PLATELET
Abs Immature Granulocytes: 0.06 10*3/uL (ref 0.00–0.07)
Basophils Absolute: 0.1 10*3/uL (ref 0.0–0.1)
Basophils Relative: 0 %
Eosinophils Absolute: 2 10*3/uL — ABNORMAL HIGH (ref 0.0–0.5)
Eosinophils Relative: 12 %
HCT: 42.4 % (ref 36.0–46.0)
Hemoglobin: 13.5 g/dL (ref 12.0–15.0)
Immature Granulocytes: 0 %
Lymphocytes Relative: 35 %
Lymphs Abs: 5.8 10*3/uL — ABNORMAL HIGH (ref 0.7–4.0)
MCH: 26.1 pg (ref 26.0–34.0)
MCHC: 31.8 g/dL (ref 30.0–36.0)
MCV: 82 fL (ref 80.0–100.0)
Monocytes Absolute: 1.2 10*3/uL — ABNORMAL HIGH (ref 0.1–1.0)
Monocytes Relative: 7 %
Neutro Abs: 7.4 10*3/uL (ref 1.7–7.7)
Neutrophils Relative %: 46 %
Platelets: 280 10*3/uL (ref 150–400)
RBC: 5.17 MIL/uL — ABNORMAL HIGH (ref 3.87–5.11)
RDW: 15.1 % (ref 11.5–15.5)
Smear Review: NORMAL
WBC Morphology: >20
WBC: 16.5 10*3/uL — ABNORMAL HIGH (ref 4.0–10.5)
nRBC: 0 % (ref 0.0–0.2)

## 2023-05-05 MED ORDER — ALBUTEROL SULFATE (2.5 MG/3ML) 0.083% IN NEBU
15.0000 mg | INHALATION_SOLUTION | Freq: Once | RESPIRATORY_TRACT | Status: AC
  Administered 2023-05-05: 15 mg via RESPIRATORY_TRACT
  Filled 2023-05-05: qty 18

## 2023-05-05 NOTE — ED Provider Notes (Signed)
 [facility] Emergency Department Provider Note MRN:  [id number]  Arrival date & time: 05/06/23     Chief Complaint   Shortness of Breath   History of Present Illness   Carol Hendrix is a 23 y.o. year-old female presents to the ED with chief complaint of SOB.  She has a history of asthma.  She states that she has been seen numerous times in the emergency department for asthma exacerbations.  She has not been able to follow-up with a pulmonologist or with asthma and allergy due to responsibilities at home with children.  She was brought in by EMS after having worsening shortness of breath.  She states that this has been continually worsening for about 2 weeks.  She has been using her home albuterol .  She has taken prednisone  at home as well without much improvement.  She was given 10 mg of albuterol , 5 mg of Atrovent , 125 mg of Solu-Medrol , and 2 g of magnesium  by EMS.  She was never hypoxic with EMS.  She has had some improvement of her symptoms after treatment with EMS.  She continues to feel short of breath now.  She denies any history of PE or DVT.  She states that she did move to a new location a while ago, and there was mold in an adjacent apartment.  She states that her apartment was assessed for mold, but nothing was found.  History provided by patient.   Review of Systems  Pertinent positive and negative review of systems noted in HPI.    Physical Exam   Vitals:   05/05/23 2330 05/05/23 2345  BP: 121/76 (!) 136/95  Pulse: (!) 111 (!) 127  Resp: (!) 23 18  Temp:    SpO2: 100% 100%    CONSTITUTIONAL:  SOB-appearing NEURO:  Alert and oriented x 3, CN 3-12 grossly intact EYES:  eyes equal and reactive ENT/NECK:  Supple, no stridor  CARDIO:  tachycardic, regular rhythm, appears well-perfused  PULM:  moderate respiratory distress, increased WOB, wheezing in all fields, speaks in short phrases GI/GU:  non-distended,  MSK/SPINE:  No gross deformities, no  edema, moves all extremities  SKIN:  no rash, atraumatic   *Additional and/or pertinent findings included in MDM below  Diagnostic and Interventional Summary    EKG Interpretation Date/Time:  Friday May 05 2023 22:33:03 EST Ventricular Rate:  128 PR Interval:  155 QRS Duration:  79 QT Interval:  294 QTC Calculation: 429 R Axis:   86  Text Interpretation: Sinus tachycardia Ventricular premature complex Consider right atrial enlargement Confirmed by Midge Golas ([id number]) on 05/05/2023 11:04:39 PM       Labs Reviewed  CBC WITH DIFFERENTIAL/PLATELET - Abnormal; Notable for the following components:      Result Value   WBC 16.5 (*)    RBC 5.17 (*)    Lymphs Abs 5.8 (*)    Monocytes Absolute 1.2 (*)    Eosinophils Absolute 2.0 (*)    All other components within normal limits  BASIC METABOLIC PANEL - Abnormal; Notable for the following components:   CO2 19 (*)    All other components within normal limits  RESP PANEL BY RT-PCR (RSV, FLU A&B, COVID)  [id number]  PATHOLOGIST SMEAR REVIEW    DG Chest Port 1 View  Final Result      Medications  albuterol  (PROVENTIL ) (2.5 MG/3ML) 0.083% nebulizer solution 15 mg (15 mg Nebulization Given 05/05/23 2249)     Procedures  /  Critical Care .Critical  Care  Performed by: Vicky Charleston, PA-C Authorized by: Vicky Charleston, PA-C   Critical care provider statement:    Critical care time (minutes):  55   Critical care was necessary to treat or prevent imminent or life-threatening deterioration of the following conditions:  Respiratory failure   Critical care was time spent personally by me on the following activities:  Development of treatment plan with patient or surrogate, discussions with consultants, evaluation of patient's response to treatment, examination of patient, ordering and review of laboratory studies, ordering and review of radiographic studies, ordering and performing treatments and interventions, pulse oximetry,  re-evaluation of patient's condition and review of old charts   ED Course and Medical Decision Making  I have reviewed the triage vital signs, the nursing notes, and pertinent available records from the EMR.  Social Determinants Affecting Complexity of Care: Patient has no clinically significant social determinants affecting this chief complaint..   ED Course:    Medical Decision Making Patient here with significant asthma exacerbation.  She has tried her home nebs and prednisone  without relief.  She received 2 DuoNebs and Solu-Medrol  and magnesium  with EMS.  She is still having significant wheezing and shortness of breath.  Initially considered BiPAP, but EMS reports the patient had improved some.  She is able to speak in short phrases.  I will first trial a continuous albuterol  treatment, but have low threshold to start BiPAP.  The patient reassessed multiple times.  She continues to improve, but still has significant wheezing and is still noticeably short of breath, but improved from arrival.  I do think that she will need admission to the hospital.  She also states that she does not feel comfortable going home.  Will consult unassigned internal medicine for admission.  Amount and/or Complexity of Data Reviewed Labs: ordered.    Details: Nonspecific leukocytosis is noted  No significant electrolyte derangement COVID and flu are negative Radiology: ordered.  Risk Prescription drug management. Decision regarding hospitalization.         Consultants: I consulted with Hospitalist, Dr. Dena, who is appreciated for admitting.   Treatment and Plan: Patient's exam and diagnostic results are concerning for asthma exacerbation.  Feel that patient will need admission to the hospital for further treatment and evaluation.    Final Clinical Impressions(s) / ED Diagnoses     ICD-10-CM   1. Severe asthma with exacerbation, unspecified whether persistent  J45.901       ED  Discharge Orders     None         Discharge Instructions Discussed with and Provided to Patient:   Discharge Instructions   None      Vicky Charleston, PA-C 05/06/23 0130    Midge Golas, MD 05/06/23 [phone]

## 2023-05-05 NOTE — ED Notes (Signed)
 Pts work of breathing is much better.  She is on the phone making arrangements for her four children.  Continuous neb is still going.

## 2023-05-05 NOTE — ED Triage Notes (Signed)
 Per [facility], pt from home and has been suffering w/ athma issues for the past three weeks.  Tonight she tried to carry some groceries and dog food upstairs and began to have another asthma attack.  She used her home meds but they did not work.  EMS found her w/ increased work of breathing and exp. Wheezes in all four quads.    Gave: 10 albuterol  5 Atrovent  2g mag 125 solumedrol 18G L AC  97% RA w/ increased work of breathing 132/90 16RR

## 2023-05-06 DIAGNOSIS — Z23 Encounter for immunization: Secondary | ICD-10-CM | POA: Diagnosis not present

## 2023-05-06 DIAGNOSIS — J45901 Unspecified asthma with (acute) exacerbation: Secondary | ICD-10-CM | POA: Diagnosis not present

## 2023-05-06 DIAGNOSIS — J4541 Moderate persistent asthma with (acute) exacerbation: Secondary | ICD-10-CM | POA: Diagnosis not present

## 2023-05-06 DIAGNOSIS — R0602 Shortness of breath: Secondary | ICD-10-CM | POA: Diagnosis not present

## 2023-05-06 DIAGNOSIS — R112 Nausea with vomiting, unspecified: Secondary | ICD-10-CM | POA: Diagnosis not present

## 2023-05-06 DIAGNOSIS — Z79899 Other long term (current) drug therapy: Secondary | ICD-10-CM | POA: Diagnosis not present

## 2023-05-06 LAB — RESP PANEL BY RT-PCR (RSV, FLU A&B, COVID)  RVPGX2
Influenza A by PCR: NEGATIVE
Influenza B by PCR: NEGATIVE
Resp Syncytial Virus by PCR: NEGATIVE
SARS Coronavirus 2 by RT PCR: NEGATIVE

## 2023-05-06 LAB — PREGNANCY, URINE: Preg Test, Ur: NEGATIVE

## 2023-05-06 MED ORDER — MORPHINE SULFATE (PF) 2 MG/ML IV SOLN
1.0000 mg | INTRAVENOUS | Status: DC | PRN
  Administered 2023-05-06 (×2): 1 mg via INTRAVENOUS
  Filled 2023-05-06 (×2): qty 1

## 2023-05-06 MED ORDER — PROCHLORPERAZINE EDISYLATE 10 MG/2ML IJ SOLN
5.0000 mg | Freq: Once | INTRAMUSCULAR | Status: AC
  Administered 2023-05-06: 5 mg via INTRAVENOUS
  Filled 2023-05-06: qty 2

## 2023-05-06 MED ORDER — ENOXAPARIN SODIUM 40 MG/0.4ML IJ SOSY
40.0000 mg | PREFILLED_SYRINGE | Freq: Every day | INTRAMUSCULAR | Status: DC
Start: 2023-05-06 — End: 2023-05-07
  Administered 2023-05-06 – 2023-05-07 (×2): 40 mg via SUBCUTANEOUS
  Filled 2023-05-06 (×2): qty 0.4

## 2023-05-06 MED ORDER — PREDNISONE 5 MG PO TABS
50.0000 mg | ORAL_TABLET | Freq: Every day | ORAL | Status: DC
  Administered 2023-05-06: 50 mg via ORAL
  Filled 2023-05-06 (×2): qty 2

## 2023-05-06 MED ORDER — ONDANSETRON HCL 4 MG PO TABS: 4.0000 mg | ORAL_TABLET | Freq: Four times a day (QID) | ORAL | Status: DC | PRN

## 2023-05-06 MED ORDER — HYDROCODONE BIT-HOMATROP MBR 5-1.5 MG/5ML PO SOLN
5.0000 mL | Freq: Four times a day (QID) | ORAL | Status: DC | PRN
  Filled 2023-05-06: qty 5

## 2023-05-06 MED ORDER — ONDANSETRON HCL 4 MG/2ML IJ SOLN
4.0000 mg | Freq: Four times a day (QID) | INTRAMUSCULAR | Status: DC | PRN
  Administered 2023-05-06 – 2023-05-07 (×2): 4 mg via INTRAVENOUS
  Filled 2023-05-06 (×2): qty 2

## 2023-05-06 MED ORDER — ACETAMINOPHEN 325 MG PO TABS: 650.0000 mg | ORAL_TABLET | Freq: Four times a day (QID) | ORAL | Status: DC | PRN

## 2023-05-06 MED ORDER — OXYCODONE HCL 5 MG PO TABS
5.0000 mg | ORAL_TABLET | Freq: Once | ORAL | Status: DC
  Filled 2023-05-06: qty 1

## 2023-05-06 MED ORDER — BUDESONIDE 0.25 MG/2ML IN SUSP
0.2500 mg | Freq: Two times a day (BID) | RESPIRATORY_TRACT | Status: DC
  Administered 2023-05-06 – 2023-05-07 (×3): 0.25 mg via RESPIRATORY_TRACT
  Filled 2023-05-06 (×3): qty 2

## 2023-05-06 MED ORDER — METHYLPREDNISOLONE SODIUM SUCC 40 MG IJ SOLR
40.0000 mg | Freq: Two times a day (BID) | INTRAMUSCULAR | Status: DC
  Administered 2023-05-06 – 2023-05-07 (×3): 40 mg via INTRAVENOUS
  Filled 2023-05-06 (×3): qty 1

## 2023-05-06 MED ORDER — INFLUENZA VIRUS VACC SPLIT PF (FLUZONE) 0.5 ML IM SUSY
0.5000 mL | PREFILLED_SYRINGE | INTRAMUSCULAR | Status: AC
  Administered 2023-05-07: 0.5 mL via INTRAMUSCULAR
  Filled 2023-05-06: qty 0.5

## 2023-05-06 MED ORDER — ACETAMINOPHEN 325 MG PO TABS
650.0000 mg | ORAL_TABLET | Freq: Four times a day (QID) | ORAL | Status: DC | PRN
  Administered 2023-05-06: 650 mg via ORAL
  Filled 2023-05-06: qty 2

## 2023-05-06 MED ORDER — GUAIFENESIN ER 600 MG PO TB12
600.0000 mg | ORAL_TABLET | Freq: Two times a day (BID) | ORAL | Status: DC
  Administered 2023-05-06 – 2023-05-07 (×3): 600 mg via ORAL
  Filled 2023-05-06 (×3): qty 1

## 2023-05-06 MED ORDER — PNEUMOCOCCAL 20-VAL CONJ VACC 0.5 ML IM SUSY
0.5000 mL | PREFILLED_SYRINGE | INTRAMUSCULAR | Status: AC
  Administered 2023-05-07: 0.5 mL via INTRAMUSCULAR
  Filled 2023-05-06: qty 0.5

## 2023-05-06 MED ORDER — ACETAMINOPHEN 650 MG RE SUPP: 650.0000 mg | Freq: Four times a day (QID) | RECTAL | Status: DC | PRN

## 2023-05-06 MED ORDER — IPRATROPIUM-ALBUTEROL 0.5-2.5 (3) MG/3ML IN SOLN
3.0000 mL | Freq: Four times a day (QID) | RESPIRATORY_TRACT | Status: DC
  Administered 2023-05-06 – 2023-05-07 (×6): 3 mL via RESPIRATORY_TRACT
  Filled 2023-05-06 (×5): qty 3

## 2023-05-06 MED ORDER — ARFORMOTEROL TARTRATE 15 MCG/2ML IN NEBU
15.0000 ug | INHALATION_SOLUTION | Freq: Two times a day (BID) | RESPIRATORY_TRACT | Status: DC
  Administered 2023-05-06 – 2023-05-07 (×3): 15 ug via RESPIRATORY_TRACT
  Filled 2023-05-06 (×3): qty 2

## 2023-05-06 NOTE — H&P (Signed)
 History and Physical    Carol Hendrix FMW:[id number] DOB: Mar 05, 2001 DOA: 05/05/2023  PCP: Default, Provider, MD   Chief Complaint: sob  HPI: Carol Hendrix is a 23 y.o. female with medical history significant of moderate persistent asthma who presents emerged part due to shortness of breath.  Patient had issues with recurrent asthma exacerbations and has not followed up with lung doctor or been compliant with home inhalers.  She developed shortness of breath earlier today so she called EMS.  She states that her shortness of breath has been controlled however persistent for last 2 weeks.  She intermittently uses her home albuterol  inhaler.  EMS transported her to the ER.  She was notably never hypoxic.  On arrival she was tachypneic breathing 20 times per minute and tachycardic in the low 100s.  Labs were obtained on presentation which demonstrated WBC 16.5, hemoglobin 13.5, bicarb 19, respiratory viral panel negative.  Patient underwent chest x-ray which showed no acute disease.  Patient was given continuous albuterol , magnesium  and steroids.  On evaluation she had persistent wheezing in her lung bases.  She denied any infectious complaints including fever or chills.  She has been unable to follow up to get recurrent steroid inhalers.   Review of Systems: Review of Systems  Constitutional: Negative.  Negative for chills and fever.  HENT: Negative.    Eyes: Negative.   Respiratory:  Positive for wheezing.   Cardiovascular: Negative.   Gastrointestinal: Negative.   Genitourinary: Negative.   Musculoskeletal: Negative.   Skin: Negative.   Neurological: Negative.   Endo/Heme/Allergies: Negative.      As per HPI otherwise 10 point review of systems negative.   No Known Allergies  Past Medical History:  Diagnosis Date   Anemia    Asthma    Delivery of twins, both live 12/29/2021   Preterm premature rupture of membranes (PPROM) delivered, current hospitalization 12/27/2021   Preterm  premature rupture of membranes (PPROM) with onset of labor after 24 hours of rupture in second trimester, antepartum 11/22/2021   Completed antibiotics and BMZ.  Pt left hospital AMA at 29 weeks due to childcare issues    Past Surgical History:  Procedure Laterality Date   NO PAST SURGERIES       reports that she has never smoked. She has never used smokeless tobacco. She reports current alcohol use. She reports current drug use. Drug: Marijuana.  Family History  Problem Relation Age of Onset   Healthy Mother    Healthy Father    Pulmonary fibrosis Maternal Grandmother     Prior to Admission medications   Medication Sig Start Date End Date Taking? Authorizing Provider  albuterol  (VENTOLIN  HFA) 108 (90 Base) MCG/ACT inhaler Inhale 1-2 puffs into the lungs every 6 (six) hours as needed for wheezing or shortness of breath. 11/11/22   Chandra Harlene LABOR, NP  albuterol  (VENTOLIN  HFA) 108 (90 Base) MCG/ACT inhaler Inhale 1-2 puffs into the lungs every 6 (six) hours as needed for wheezing or shortness of breath. 01/20/23   Dreama, [place]  N, FNP  benzonatate  (TESSALON ) 200 MG capsule Take 1 capsule (200 mg total) by mouth 3 (three) times daily as needed for cough. 04/22/23   Nivia Colon, PA-C  medroxyPROGESTERone  (DEPO-PROVERA ) 150 MG/ML injection Inject 1 mL (150 mg total) into the muscle every 3 (three) months. 09/06/22   Anyanwu, [place] A, MD  medroxyPROGESTERone  Acetate 150 MG/ML SUSY ADMINISTER 1 ML(150 MG) IN THE MUSCLE EVERY 3 MONTHS 09/06/22   Rudy Carlin LABOR,  MD  metroNIDAZOLE  (FLAGYL ) 500 MG tablet Take 1 tablet (500 mg total) by mouth 2 (two) times daily. 04/18/23   Gladis Elsie BROCKS, PA-C  montelukast  (SINGULAIR ) 10 MG tablet Take 1 tablet (10 mg total) by mouth at bedtime. 11/11/22   Chandra Harlene LABOR, NP  ondansetron  (ZOFRAN -ODT) 4 MG disintegrating tablet Take 1 tablet (4 mg total) by mouth every 8 (eight) hours as needed. 12/01/22   Dean Clarity, MD  predniSONE  (DELTASONE ) 20  MG tablet 3 tabs po day one, then 2 tabs daily x 4 days 04/22/23   Nivia Colon, PA-C  ipratropium (ATROVENT ) 0.03 % nasal spray Place 2 sprays into both nostrils 2 (two) times daily. Patient not taking: Reported on 07/31/2018 06/21/18 11/13/18  Arloa Suzen RAMAN, NP    Physical Exam: Vitals:   05/06/23 0000 05/06/23 0015 05/06/23 0045 05/06/23 0130  BP: 131/71 123/75 128/76 138/80  Pulse: (!) 108 (!) 105 100 (!) 113  Resp: (!) 23 (!) 25 (!) 21 20  Temp:      TempSrc:      SpO2: 100% 100% 100% 96%  Weight:      Height:       Physical Exam Vitals reviewed.  Constitutional:      Appearance: She is normal weight.  HENT:     Head: Normocephalic.  Eyes:     Pupils: Pupils are equal, round, and reactive to light.  Cardiovascular:     Rate and Rhythm: Normal rate and regular rhythm.  Pulmonary:     Effort: Respiratory distress present.     Breath sounds: Wheezing present.  Abdominal:     Palpations: Abdomen is soft.  Musculoskeletal:        General: Normal range of motion.     Cervical back: Normal range of motion.  Skin:    General: Skin is warm.     Capillary Refill: Capillary refill takes less than 2 seconds.  Neurological:     General: No focal deficit present.     Mental Status: She is alert.  Psychiatric:        Mood and Affect: Mood normal.       Labs on Admission: I have personally reviewed the patients's labs and imaging studies.  Assessment/Plan Principal Problem:   Asthma exacerbation   # Moderate persistent asthma exacerbation -Patient tachypneic on presentation - Wheezing in lung bases - No hypoxia - Noncompliant with steroid inhaler  Plan: Continue scheduled DuoNebs Pulmicort  [place]  Start prednisone  Suspicion for infection is low so will not start antibiotics Patient will need outpatient follow-up with plans for recurrent inhalers.  Patient was educated on triggers for asthma including smoking mold etc.   Admission status: Inpatient Telemetry  Medical  Certification: The appropriate patient status for this patient is INPATIENT. Inpatient status is judged to be reasonable and necessary in order to provide the required intensity of service to ensure the patient's safety. The patient's presenting symptoms, physical exam findings, and initial radiographic and laboratory data in the context of their chronic comorbidities is felt to place them at high risk for further clinical deterioration. Furthermore, it is not anticipated that the patient will be medically stable for discharge from the hospital within 2 midnights of admission.   * I certify that at the point of admission it is my clinical judgment that the patient will require inpatient hospital care spanning beyond 2 midnights from the point of admission due to high intensity of service, high risk for further deterioration and high frequency of  surveillance required.DEWAINE Lamar Dess MD [facility] If 7PM-7AM, please contact night-coverage [email or web address]  05/06/2023, 1:39 AM

## 2023-05-06 NOTE — Plan of Care (Signed)

## 2023-05-06 NOTE — ED Notes (Signed)
 PT reporting severe head ache. History of migraines. PRN medication order obtained. Pt continues to be nauseous. 1x emesis event in last hour.

## 2023-05-06 NOTE — Progress Notes (Signed)
 New Admission Note:   Arrival Method: stretcher Mental Orientation: aa+ox4 Telemetry: box 7 Assessment: Completed Skin: C/D/I IV: SL Pain: denies Tubes: N/A Safety Measures: Safety Fall Prevention Plan has been given, discussed and signed Admission: Completed [facility] Orientation: Patient has been orientated to the room, unit and staff.  Family: not present  Orders have been reviewed and implemented. Will continue to monitor the patient. Call light has been placed within reach and bed alarm has been activated.   Doyal Sias, RN

## 2023-05-06 NOTE — Progress Notes (Signed)
 PROGRESS NOTE  Carol Hendrix  DOB: [birth year]  PCP: Default, Provider, MD FMW:[id number]  DOA: 05/05/2023  LOS: 0 days  Hospital Day: 2  Brief narrative: Carol Hendrix is a 23 y.o. female with PMH significant for moderate persistent asthma, seen in the ED multiple times in the past for asthma exacerbations, not able to follow-up with the pulmonologist or allergy specialist due to her responsibilities at home with her children. 1/10, patient presented to the ED with complaint of shortness of breath progressive for last 2 weeks.  She tried a course of prednisone  without help and has been using albuterol  inhaler a lot more than her normal.  In the ED, patient was afebrile, heart rate in 120s, tachypneic to 20s, breathing on room air O2 sat 100%. Labs with WC count 16.5, hemoglobin 13.5 Respiratory virus panel negative Chest x-ray unremarkable Patient was given steroids, magnesium  sulfate bronchodilators Patient continued to have persistent wheezing Admitted to [facility]  Subjective: Patient was seen and examined this morning. He has remained confused.  Sitting up in bed.  Continues to have cough, wheezing.  Does not feel better than presentation. Chart reviewed. This morning, tachycardia is improved to 90s, blood pressure in 120s, breathing on room air, not tachypneic.  Assessment and plan: Acute exacerbation of moderate persistent asthma  Presented with worsening shortness of breath for 2 weeks Noted her tachypnea, wheezing, respiratory distress On admission, she was started on oral prednisone .  Given the persistence of significant wheezing and cough, will start the patient on Solu-Medrol  40 mg IV twice daily. Continue scheduled DuoNebs Pulmicort , Brovana  No evidence of infection, not on antibiotics currently. Needs pulmonologist/allergy specialist as an outpatient.  Nausea, vomiting Vomiting this morning.  She stated to RN that she has missed her menstrual cycle and wanted a urine  pregnancy test done.  Ordered.   Mobility: Encourage ambulation  Goals of care   Code Status: Full Code     DVT prophylaxis:  enoxaparin  (LOVENOX ) injection 40 mg Start: 05/06/23 1000 SCDs Start: 05/06/23 0138   Antimicrobials: None Fluid: None Consultants: None Family Communication: None at bedside  Status: Inpatient Level of care:  Telemetry Medical   Patient is from: Home Needs to continue in-hospital care: Ongoing wheezing, cough Anticipated d/c to: Hopefully home in 1 to 2 days    Diet:  Diet Order             Diet regular Room service appropriate? Yes; Fluid consistency: Thin  Diet effective now                   Scheduled Meds:  arformoterol   15 mcg Nebulization BID   budesonide  (PULMICORT ) nebulizer solution  0.25 mg Nebulization BID   enoxaparin  (LOVENOX ) injection  40 mg Subcutaneous Daily   guaiFENesin   600 mg Oral BID   ipratropium-albuterol   3 mL Nebulization Q6H   medroxyPROGESTERone   150 mg Intramuscular Q90 days   methylPREDNISolone  (SOLU-MEDROL ) injection  40 mg Intravenous Q12H    PRN meds: acetaminophen  **OR** acetaminophen , HYDROcodone  bit-homatropine, ondansetron  **OR** ondansetron  (ZOFRAN ) IV   Infusions:    Antimicrobials: Anti-infectives (From admission, onward)    None       Objective: Vitals:   05/06/23 1059 05/06/23 1130  BP:  115/75  Pulse:  89  Resp:  15  Temp: 98.1 F (36.7 C)   SpO2:  99%   No intake or output data in the 24 hours ending 05/06/23 1342 Filed Weights   05/05/23 2242  Weight: 74.8  kg   Weight change:  Body mass index is 27.46 kg/m.   Physical Exam: General exam: Pleasant, young African-American female Skin: No rashes, lesions or ulcers. HEENT: Atraumatic, normocephalic, no obvious bleeding Lungs: Diffuse bilateral wheezing, coughs on deep breathing, no crackles CVS: Sinus tachycardia, S1, S2, no murmur,   GI/Abd: Soft, nontender, nondistended, bowel sound present,   CNS: Alert,  awake, oriented x 3 Psychiatry: Mood appropriate,  Extremities: No pedal edema, no calf tenderness,   Data Review: I have personally reviewed the laboratory data and studies available.  F/u labs ordered Unresulted Labs (From admission, onward)     Start     Ordered   05/05/23 2235  Pathologist smear review  Once,   R        05/05/23 2235            Total time spent in review of labs and imaging, patient evaluation, formulation of plan, documentation and communication with family: 45 minutes  Signed, Chapman Rota, MD [facility] 05/06/2023

## 2023-05-06 NOTE — ED Notes (Signed)
 Per RT, Pulmicort & Brovana are morning treatments & states she will move them to 0800.

## 2023-05-06 NOTE — ED Notes (Signed)
 CCMD notified. Pt is on monitor.

## 2023-05-07 DIAGNOSIS — J4541 Moderate persistent asthma with (acute) exacerbation: Secondary | ICD-10-CM | POA: Diagnosis not present

## 2023-05-07 MED ORDER — GUAIFENESIN ER 600 MG PO TB12: 600.0000 mg | ORAL_TABLET | Freq: Two times a day (BID) | ORAL | 0 refills | Status: AC

## 2023-05-07 MED ORDER — HYDROCODONE BIT-HOMATROP MBR 5-1.5 MG/5ML PO SOLN: 5.0000 mL | Freq: Four times a day (QID) | ORAL | 0 refills | Status: DC | PRN

## 2023-05-07 MED ORDER — PREDNISONE 10 MG PO TABS: ORAL_TABLET | ORAL | 0 refills | Status: DC

## 2023-05-07 NOTE — Discharge Summary (Addendum)
 Physician Discharge Summary  Carol Hendrix FMW:[id number] DOB: 07-05-2000 DOA: 05/05/2023  PCP: Default, Provider, MD  Admit date: 05/05/2023 Discharge date: 05/07/2023  Admitted From: Home Discharge disposition: Home  Recommendations at discharge:  Complete tapering course of prednisone  Continue as needed cough medicines and bronchodilators At your request, an outpatient referral to allergy specialist has been sent.   Brief narrative: Carol Hendrix is a 23 y.o. female with PMH significant for moderate persistent asthma, seen in the ED multiple times in the past for asthma exacerbations, not able to follow-up with the pulmonologist or allergy specialist due to her responsibilities at home with her children. 1/10, patient presented to the ED with complaint of shortness of breath progressive for last 2 weeks.  She tried a course of prednisone  without help and has been using albuterol  inhaler a lot more than her normal.  In the ED, patient was afebrile, heart rate in 120s, tachypneic to 20s, breathing on room air O2 sat 100%. Labs with WC count 16.5, hemoglobin 13.5 Respiratory virus panel negative Chest x-ray unremarkable Patient was given steroids, magnesium  sulfate bronchodilators Patient continued to have persistent wheezing Admitted to [facility]  Subjective: Patient was seen and examined this morning. Sitting up in bed.  Headache has improved.  Cough improving but he still there and hurts every time she coughs.  Wheezing improving.  On auscultation, and noted minimal wheezing which is much better than yesterday. Earlier in the morning, she was able to ambulate in the hallway without supplemental oxygen , maintain O2 sat at 94%.  Feels comfortable going home today.  Hospital course: Acute exacerbation of moderate persistent asthma  Presented with worsening shortness of breath for 2 weeks Noted her tachypnea, wheezing, respiratory distress She was started on IV Solu-Medrol  40 mg twice  daily, bronchodilators. No evidence of infection.  Not on antibiotics. Clinically improving.  Okay to discharge home today on taper course of steroids.  Continue antitussives and bronchodilators as needed. She states her symptoms have been worse this winter, I have advised her to use a humidifier.  She also believes that the apartment she lives in has mold and she is allergic to that.  She is in process of talking to the apartment production designer, theatre/television/film. At her request, I wrote a referral for allergy specialist.   Goals of care   Code Status: Full Code   Diet:  Diet Order             Diet general           Diet regular Room service appropriate? Yes; Fluid consistency: Thin  Diet effective now                   Nutritional status:  Body mass index is 27.46 kg/m.       Wounds:  -    Discharge Exam:   Vitals:   05/06/23 2200 05/07/23 0602 05/07/23 0713 05/07/23 0815  BP: 98/78 109/68 109/65 116/74  Pulse: 96 65 90 89  Resp:  14 15   Temp: 98.9 F (37.2 C) 98.6 F (37 C) 98.6 F (37 C) 98.3 F (36.8 C)  TempSrc: Oral  Oral Oral  SpO2: 98% 94% 97% 98%  Weight:      Height:        Body mass index is 27.46 kg/m.   General exam: Pleasant, young African-American female Skin: No rashes, lesions or ulcers. HEENT: Atraumatic, normocephalic, no obvious bleeding Lungs: Minimal wheezing on right lower lobe.  Clear to  auscultation otherwise.  Coughs on deep breathing but less than yesterday. CVS: Sinus tachycardia, S1, S2, no murmur,   GI/Abd: Soft, nontender, nondistended, bowel sound present,   CNS: Alert, awake, oriented x 3 Psychiatry: Mood appropriate,  Extremities: No pedal edema, no calf tenderness,   Follow ups:    Follow-up Information     Rudy Carlin LABOR, MD Follow up.   Specialty: Obstetrics and Gynecology Contact information: [address] [place] [address] [phone]                 Discharge Instructions:   Discharge  Instructions     Ambulatory referral to Allergy   Complete by: As directed    Asthma exacerbation.  Suspects she is allergic to mold   Call MD for:  difficulty breathing, headache or visual disturbances   Complete by: As directed    Call MD for:  extreme fatigue   Complete by: As directed    Call MD for:  hives   Complete by: As directed    Call MD for:  persistant dizziness or light-headedness   Complete by: As directed    Call MD for:  persistant nausea and vomiting   Complete by: As directed    Call MD for:  severe uncontrolled pain   Complete by: As directed    Call MD for:  temperature >100.4   Complete by: As directed    Diet general   Complete by: As directed    Discharge instructions   Complete by: As directed    Recommendations at discharge:   Complete tapering course of prednisone   Continue as needed cough medicines and bronchodilators  At your request, an outpatient referral to allergy specialist has been sent.  General discharge instructions: Follow with Primary MD Default, Provider, MD in 7 days  Please request your PCP  to go over your hospital tests, procedures, radiology results at the follow up. Please get your medicines reviewed and adjusted.  Your PCP may decide to repeat certain labs or tests as needed. Do not drive, operate heavy machinery, perform activities at heights, swimming or participation in water activities or provide baby sitting services if your were admitted for syncope or siezures until you have seen by Primary MD or a Neurologist and advised to do so again. [place]  Controlled Substance Reporting System database was reviewed. Do not drive, operate heavy machinery, perform activities at heights, swim, participate in water activities or provide baby-sitting services while on medications for pain, sleep and mood until your outpatient physician has reevaluated you and advised to do so again.  You are strongly recommended to comply with the dose,  frequency and duration of prescribed medications. Activity: As tolerated with Full fall precautions use walker/cane & assistance as needed Avoid using any recreational substances like cigarette, tobacco, alcohol, or non-prescribed drug. If you experience worsening of your admission symptoms, develop shortness of breath, life threatening emergency, suicidal or homicidal thoughts you must seek medical attention immediately by calling 911 or calling your MD immediately  if symptoms less severe. You must read complete instructions/literature along with all the possible adverse reactions/side effects for all the medicines you take and that have been prescribed to you. Take any new medicine only after you have completely understood and accepted all the possible adverse reactions/side effects.  Wear Seat belts while driving. You were cared for by a hospitalist during your hospital stay. If you have any questions about your discharge medications or the care  you received while you were in the hospital after you are discharged, you can call the unit and ask to speak with the hospitalist or the covering physician. Once you are discharged, your primary care physician will handle any further medical issues. Please note that NO REFILLS for any discharge medications will be authorized once you are discharged, as it is imperative that you return to your primary care physician (or establish a relationship with a primary care physician if you do not have one).   Increase activity slowly   Complete by: As directed        Discharge Medications:   Allergies as of 05/07/2023   No Known Allergies      Medication List     TAKE these medications    acetaminophen  500 MG tablet Commonly known as: TYLENOL  Take 1,000 mg by mouth every 6 (six) hours as needed for mild pain (pain score 1-3) or headache.   albuterol  (2.5 MG/3ML) 0.083% nebulizer solution Commonly known as: PROVENTIL  Inhale 2.5 mg into the lungs every  4 (four) hours as needed (SOB wheezing).   albuterol  108 (90 Base) MCG/ACT inhaler Commonly known as: VENTOLIN  HFA Inhale 1-2 puffs into the lungs every 6 (six) hours as needed for wheezing or shortness of breath.   benzonatate  200 MG capsule Commonly known as: TESSALON  Take 1 capsule (200 mg total) by mouth 3 (three) times daily as needed for cough.   guaiFENesin  600 MG 12 hr tablet Commonly known as: MUCINEX  Take 1 tablet (600 mg total) by mouth 2 (two) times daily for 7 days.   HYDROcodone  bit-homatropine 5-1.5 MG/5ML syrup Commonly known as: HYCODAN Take 5 mLs by mouth every 6 (six) hours as needed for up to 6 days for cough.   ibuprofen  800 MG tablet Commonly known as: ADVIL  Take 800 mg by mouth every 8 (eight) hours as needed.   montelukast  10 MG tablet Commonly known as: Singulair  Take 1 tablet (10 mg total) by mouth at bedtime.   predniSONE  10 MG tablet Commonly known as: DELTASONE  Take 6 tabs twice a day for 2 days, then take 4 tabs twice a day for 2 days, then take 4 tabs daily for 2 days, then take 3 tabs daily for 2 days, then take 2 tabs daily for 2 days, then take 1 tab daily for 2 days, then STOP.         The results of significant diagnostics from this hospitalization (including imaging, microbiology, ancillary and laboratory) are listed below for reference.    Procedures and Diagnostic Studies:   DG Chest Port 1 View Result Date: 05/05/2023 CLINICAL DATA:  Shortness of breath. EXAM: PORTABLE CHEST 1 VIEW COMPARISON:  April 22, 2023 FINDINGS: The heart size and mediastinal contours are within normal limits. Both lungs are clear. The visualized skeletal structures are unremarkable. IMPRESSION: No active disease. Electronically Signed   By: Suzen Dials M.D.   On: 05/05/2023 22:53     Labs:   Basic Metabolic Panel: Recent Labs  Lab 05/05/23 2235  NA 136  K 3.9  CL 103  CO2 19*  GLUCOSE 93  BUN 7  CREATININE 0.77  CALCIUM  8.9    GFR Estimated Creatinine Clearance: 110.7 mL/min (by C-G formula based on SCr of 0.77 mg/dL). Liver Function Tests: No results for input(s): AST, ALT, ALKPHOS, BILITOT, PROT, ALBUMIN in the last 168 hours. No results for input(s): LIPASE, AMYLASE in the last 168 hours. No results for input(s): AMMONIA in the last 168 hours.  Coagulation profile No results for input(s): INR, PROTIME in the last 168 hours.  CBC: Recent Labs  Lab 05/05/23 2235  WBC 16.5*  NEUTROABS 7.4  HGB 13.5  HCT 42.4  MCV 82.0  PLT 280   Cardiac Enzymes: No results for input(s): CKTOTAL, CKMB, CKMBINDEX, TROPONINI in the last 168 hours. BNP: Invalid input(s): POCBNP CBG: No results for input(s): GLUCAP in the last 168 hours. D-Dimer No results for input(s): DDIMER in the last 72 hours. Hgb A1c No results for input(s): HGBA1C in the last 72 hours. Lipid Profile No results for input(s): CHOL, HDL, LDLCALC, TRIG, CHOLHDL, LDLDIRECT in the last 72 hours. Thyroid  function studies No results for input(s): TSH, T4TOTAL, T3FREE, THYROIDAB in the last 72 hours.  Invalid input(s): FREET3 Anemia work up No results for input(s): VITAMINB12, FOLATE, FERRITIN, TIBC, IRON , RETICCTPCT in the last 72 hours. Microbiology Recent Results (from the past 240 hours)  Resp panel by RT-PCR (RSV, Flu A&B, Covid) Anterior Nasal Swab     Status: None   Collection Time: 05/05/23 10:42 PM   Specimen: Anterior Nasal Swab  Result Value Ref Range Status   SARS Coronavirus 2 by RT PCR NEGATIVE NEGATIVE Final   Influenza A by PCR NEGATIVE NEGATIVE Final   Influenza B by PCR NEGATIVE NEGATIVE Final    Comment: (NOTE) The Xpert Xpress SARS-CoV-2/FLU/RSV plus assay is intended as an aid in the diagnosis of influenza from Nasopharyngeal swab specimens and should not be used as a sole basis for treatment. Nasal washings and aspirates are unacceptable for Xpert  Xpress SARS-CoV-2/FLU/RSV testing.  Fact Sheet for Patients: bloggercourse.com  Fact Sheet for Healthcare Providers: seriousbroker.it  This test is not yet approved or cleared by the [place]  FDA and has been authorized for detection and/or diagnosis of SARS-CoV-2 by FDA under an Emergency Use Authorization (EUA). This EUA will remain in effect (meaning this test can be used) for the duration of the COVID-19 declaration under Section 564(b)(1) of the Act, 21 U.S.C. section 360bbb-3(b)(1), unless the authorization is terminated or revoked.     Resp Syncytial Virus by PCR NEGATIVE NEGATIVE Final    Comment: (NOTE) Fact Sheet for Patients: bloggercourse.com  Fact Sheet for Healthcare Providers: seriousbroker.it  This test is not yet approved or cleared by the [place]  FDA and has been authorized for detection and/or diagnosis of SARS-CoV-2 by FDA under an Emergency Use Authorization (EUA). This EUA will remain in effect (meaning this test can be used) for the duration of the COVID-19 declaration under Section 564(b)(1) of the Act, 21 U.S.C. section 360bbb-3(b)(1), unless the authorization is terminated or revoked.  Performed at [facility], [address], [place], [place] [address]     Time coordinating discharge: 45 minutes  Signed: Marleni Gallardo  [facility] 05/07/2023, 2:05 PM

## 2023-05-07 NOTE — Plan of Care (Signed)

## 2023-05-07 NOTE — Progress Notes (Signed)
 DISCHARGE NOTE HOME [name] to be discharged Home per MD order. Discussed prescriptions and follow up appointments with the patient. Prescriptions given to patient; medication list explained in detail. Patient verbalized understanding.  Skin clean, dry and intact without evidence of skin break down, no evidence of skin tears noted. IV catheter discontinued intact. Site without signs and symptoms of complications. Dressing and pressure applied. Pt denies pain at the site currently. No complaints noted.  Patient free of lines, drains, and wounds.   An After Visit Summary (AVS) was printed and given to the patient. Patient escorted via wheelchair, and discharged home via private auto.  Doyal Sias, RN

## 2023-05-07 NOTE — Progress Notes (Signed)
 Pt. Ambulating in hallway without difficulty, no SOB noted, slight occasional congested cough, O2 sat 94% on R/A. HR 110  Carol Hendrix

## 2023-05-08 LAB — PATHOLOGIST SMEAR REVIEW: Path Review: REACTIVE

## 2023-05-12 ENCOUNTER — Emergency Department (HOSPITAL_COMMUNITY)
Admission: EM | Admit: 2023-05-12 | Discharge: 2023-05-12 | Disposition: A | Discharge status: Home / Self Care | Payer: Medicaid Other | Attending: Emergency Medicine | Admitting: Emergency Medicine

## 2023-05-12 ENCOUNTER — Encounter (HOSPITAL_COMMUNITY): Payer: Self-pay | Admitting: Emergency Medicine

## 2023-05-12 ENCOUNTER — Observation Stay (HOSPITAL_COMMUNITY)
Admission: EM | Admit: 2023-05-12 | Discharge: 2023-05-13 | Disposition: A | Discharge status: Home / Self Care | Payer: Medicaid Other | Attending: Internal Medicine | Admitting: Internal Medicine

## 2023-05-12 ENCOUNTER — Emergency Department (HOSPITAL_COMMUNITY): Payer: Medicaid Other

## 2023-05-12 ENCOUNTER — Encounter (HOSPITAL_COMMUNITY): Payer: Self-pay

## 2023-05-12 DIAGNOSIS — J4541 Moderate persistent asthma with (acute) exacerbation: Secondary | ICD-10-CM | POA: Diagnosis not present

## 2023-05-12 DIAGNOSIS — J45909 Unspecified asthma, uncomplicated: Secondary | ICD-10-CM | POA: Diagnosis not present

## 2023-05-12 DIAGNOSIS — Z789 Other specified health status: Secondary | ICD-10-CM | POA: Diagnosis not present

## 2023-05-12 DIAGNOSIS — R000 Tachycardia, unspecified: Secondary | ICD-10-CM | POA: Diagnosis not present

## 2023-05-12 DIAGNOSIS — E876 Hypokalemia: Secondary | ICD-10-CM | POA: Insufficient documentation

## 2023-05-12 DIAGNOSIS — R112 Nausea with vomiting, unspecified: Secondary | ICD-10-CM | POA: Diagnosis not present

## 2023-05-12 DIAGNOSIS — J09X2 Influenza due to identified novel influenza A virus with other respiratory manifestations: Secondary | ICD-10-CM | POA: Insufficient documentation

## 2023-05-12 DIAGNOSIS — Z743 Need for continuous supervision: Secondary | ICD-10-CM | POA: Diagnosis not present

## 2023-05-12 DIAGNOSIS — J101 Influenza due to other identified influenza virus with other respiratory manifestations: Secondary | ICD-10-CM

## 2023-05-12 DIAGNOSIS — Z20822 Contact with and (suspected) exposure to covid-19: Secondary | ICD-10-CM | POA: Insufficient documentation

## 2023-05-12 DIAGNOSIS — J4540 Moderate persistent asthma, uncomplicated: Secondary | ICD-10-CM | POA: Diagnosis present

## 2023-05-12 DIAGNOSIS — Z79899 Other long term (current) drug therapy: Secondary | ICD-10-CM | POA: Diagnosis not present

## 2023-05-12 DIAGNOSIS — J45901 Unspecified asthma with (acute) exacerbation: Secondary | ICD-10-CM | POA: Insufficient documentation

## 2023-05-12 DIAGNOSIS — R059 Cough, unspecified: Secondary | ICD-10-CM | POA: Diagnosis not present

## 2023-05-12 DIAGNOSIS — J09X9 Influenza due to identified novel influenza A virus with other manifestations: Secondary | ICD-10-CM | POA: Insufficient documentation

## 2023-05-12 DIAGNOSIS — R0602 Shortness of breath: Secondary | ICD-10-CM | POA: Diagnosis present

## 2023-05-12 DIAGNOSIS — R1084 Generalized abdominal pain: Secondary | ICD-10-CM | POA: Diagnosis not present

## 2023-05-12 LAB — COMPREHENSIVE METABOLIC PANEL
ALT: 14 U/L (ref 0–44)
AST: 15 U/L (ref 15–41)
Albumin: 3.9 g/dL (ref 3.5–5.0)
Alkaline Phosphatase: 55 U/L (ref 38–126)
Anion gap: 10 (ref 5–15)
BUN: 10 mg/dL (ref 6–20)
CO2: 20 mmol/L — ABNORMAL LOW (ref 22–32)
Calcium: 8 mg/dL — ABNORMAL LOW (ref 8.9–10.3)
Chloride: 105 mmol/L (ref 98–111)
Creatinine, Ser: 0.58 mg/dL (ref 0.44–1.00)
GFR, Estimated: >60 mL/min (ref >60)
Glucose, Bld: 139 mg/dL — ABNORMAL HIGH (ref 70–99)
Potassium: 3 mmol/L — ABNORMAL LOW (ref 3.5–5.1)
Sodium: 135 mmol/L (ref 135–145)
Total Bilirubin: 0.6 mg/dL (ref 0.0–1.2)
Total Protein: 6.6 g/dL (ref 6.5–8.1)

## 2023-05-12 LAB — CBC WITH DIFFERENTIAL/PLATELET
Abs Immature Granulocytes: 0.04 10*3/uL (ref 0.00–0.07)
Basophils Absolute: 0 10*3/uL (ref 0.0–0.1)
Basophils Relative: 0 %
Eosinophils Absolute: 0.2 10*3/uL (ref 0.0–0.5)
Eosinophils Relative: 1 %
HCT: 39.8 % (ref 36.0–46.0)
Hemoglobin: 12.5 g/dL (ref 12.0–15.0)
Immature Granulocytes: 0 %
Lymphocytes Relative: 11 %
Lymphs Abs: 1.2 10*3/uL (ref 0.7–4.0)
MCH: 26.2 pg (ref 26.0–34.0)
MCHC: 31.4 g/dL (ref 30.0–36.0)
MCV: 83.4 fL (ref 80.0–100.0)
Monocytes Absolute: 0.4 10*3/uL (ref 0.1–1.0)
Monocytes Relative: 4 %
Neutro Abs: 9.2 10*3/uL — ABNORMAL HIGH (ref 1.7–7.7)
Neutrophils Relative %: 84 %
Platelets: 201 10*3/uL (ref 150–400)
RBC: 4.77 MIL/uL (ref 3.87–5.11)
RDW: 15.1 % (ref 11.5–15.5)
WBC: 11.1 10*3/uL — ABNORMAL HIGH (ref 4.0–10.5)
nRBC: 0 % (ref 0.0–0.2)

## 2023-05-12 LAB — HCG, SERUM, QUALITATIVE: Preg, Serum: NEGATIVE

## 2023-05-12 LAB — RESP PANEL BY RT-PCR (RSV, FLU A&B, COVID)  RVPGX2
Influenza A by PCR: POSITIVE — AB
Influenza B by PCR: NEGATIVE
Resp Syncytial Virus by PCR: NEGATIVE
SARS Coronavirus 2 by RT PCR: NEGATIVE

## 2023-05-12 MED ORDER — IPRATROPIUM-ALBUTEROL 0.5-2.5 (3) MG/3ML IN SOLN
3.0000 mL | Freq: Four times a day (QID) | RESPIRATORY_TRACT | Status: DC
  Administered 2023-05-13 (×3): 3 mL via RESPIRATORY_TRACT
  Filled 2023-05-12 (×3): qty 3

## 2023-05-12 MED ORDER — ACETAMINOPHEN 325 MG PO TABS: 650.0000 mg | ORAL_TABLET | Freq: Four times a day (QID) | ORAL | Status: DC | PRN

## 2023-05-12 MED ORDER — IBUPROFEN 400 MG PO TABS
600.0000 mg | ORAL_TABLET | Freq: Once | ORAL | Status: AC
  Administered 2023-05-12: 600 mg via ORAL
  Filled 2023-05-12: qty 1

## 2023-05-12 MED ORDER — HYDROCODONE BIT-HOMATROP MBR 5-1.5 MG/5ML PO SOLN
5.0000 mL | Freq: Once | ORAL | Status: AC
  Administered 2023-05-12: 5 mL via ORAL
  Filled 2023-05-12: qty 5

## 2023-05-12 MED ORDER — POTASSIUM CHLORIDE CRYS ER 20 MEQ PO TBCR
40.0000 meq | EXTENDED_RELEASE_TABLET | Freq: Once | ORAL | Status: AC
  Administered 2023-05-12: 40 meq via ORAL
  Filled 2023-05-12: qty 2

## 2023-05-12 MED ORDER — POTASSIUM CHLORIDE CRYS ER 20 MEQ PO TBCR
40.0000 meq | EXTENDED_RELEASE_TABLET | Freq: Once | ORAL | Status: AC
  Administered 2023-05-13: 40 meq via ORAL
  Filled 2023-05-12: qty 2

## 2023-05-12 MED ORDER — ACETAMINOPHEN 325 MG PO TABS
650.0000 mg | ORAL_TABLET | Freq: Once | ORAL | Status: AC
  Administered 2023-05-13: 650 mg via ORAL
  Filled 2023-05-12: qty 2

## 2023-05-12 MED ORDER — ENOXAPARIN SODIUM 40 MG/0.4ML IJ SOSY
40.0000 mg | PREFILLED_SYRINGE | INTRAMUSCULAR | Status: DC
  Administered 2023-05-13: 40 mg via SUBCUTANEOUS
  Filled 2023-05-12: qty 0.4

## 2023-05-12 MED ORDER — SODIUM CHLORIDE 0.9 % IV BOLUS
1000.0000 mL | Freq: Once | INTRAVENOUS | Status: AC
  Administered 2023-05-12: 1000 mL via INTRAVENOUS

## 2023-05-12 MED ORDER — IPRATROPIUM-ALBUTEROL 0.5-2.5 (3) MG/3ML IN SOLN: 3.0000 mL | RESPIRATORY_TRACT | Status: DC | PRN

## 2023-05-12 MED ORDER — OSELTAMIVIR PHOSPHATE 75 MG PO CAPS
75.0000 mg | ORAL_CAPSULE | Freq: Once | ORAL | Status: AC
  Administered 2023-05-12: 75 mg via ORAL
  Filled 2023-05-12: qty 1

## 2023-05-12 MED ORDER — OSELTAMIVIR PHOSPHATE 75 MG PO CAPS
75.0000 mg | ORAL_CAPSULE | Freq: Two times a day (BID) | ORAL | Status: DC
  Administered 2023-05-13 (×2): 75 mg via ORAL
  Filled 2023-05-12 (×2): qty 1

## 2023-05-12 MED ORDER — OSELTAMIVIR PHOSPHATE 75 MG PO CAPS: 75.0000 mg | ORAL_CAPSULE | Freq: Two times a day (BID) | ORAL | 0 refills | Status: DC

## 2023-05-12 MED ORDER — METHYLPREDNISOLONE SODIUM SUCC 125 MG IJ SOLR
80.0000 mg | INTRAMUSCULAR | Status: DC
Start: 2023-05-13 — End: 2023-05-13
  Administered 2023-05-13: 80 mg via INTRAVENOUS
  Filled 2023-05-12: qty 2

## 2023-05-12 MED ORDER — GUAIFENESIN ER 600 MG PO TB12
600.0000 mg | ORAL_TABLET | Freq: Once | ORAL | Status: AC
Start: 2023-05-12 — End: 2023-05-12
  Administered 2023-05-12: 600 mg via ORAL
  Filled 2023-05-12: qty 1

## 2023-05-12 MED ORDER — METOCLOPRAMIDE HCL 5 MG/ML IJ SOLN
10.0000 mg | Freq: Once | INTRAMUSCULAR | Status: AC
  Administered 2023-05-12: 10 mg via INTRAVENOUS
  Filled 2023-05-12: qty 2

## 2023-05-12 MED ORDER — ALBUTEROL SULFATE (2.5 MG/3ML) 0.083% IN NEBU: 2.5000 mg | INHALATION_SOLUTION | RESPIRATORY_TRACT | 1 refills | Status: DC | PRN

## 2023-05-12 MED ORDER — MONTELUKAST SODIUM 10 MG PO TABS
10.0000 mg | ORAL_TABLET | Freq: Every day | ORAL | Status: DC
  Administered 2023-05-13: 10 mg via ORAL
  Filled 2023-05-12: qty 1

## 2023-05-12 MED ORDER — IPRATROPIUM-ALBUTEROL 0.5-2.5 (3) MG/3ML IN SOLN
3.0000 mL | Freq: Once | RESPIRATORY_TRACT | Status: AC
  Administered 2023-05-12: 3 mL via RESPIRATORY_TRACT
  Filled 2023-05-12: qty 3

## 2023-05-12 MED ORDER — POTASSIUM CHLORIDE CRYS ER 20 MEQ PO TBCR: 20.0000 meq | EXTENDED_RELEASE_TABLET | Freq: Every day | ORAL | 0 refills | Status: DC

## 2023-05-12 MED ORDER — PREDNISONE 20 MG PO TABS: ORAL_TABLET | ORAL | 0 refills | Status: DC

## 2023-05-12 NOTE — Discharge Instructions (Addendum)
Your influenza test is positive today.  We are treating you with an antiviral medicine called Tamiflu.  You were given the first dose in the emergency department, take the next dose around 12 hours later.  We are also prescribing you steroids for 2 weeks to help with your asthma exacerbation.  Continue to use your albuterol, every 4 hours for cough, shortness of breath, wheezing.  If you needed significantly more than this, develop blood in your cough, chest pain, vomiting, or any other new/concerning symptoms then return to the ER or call 911.

## 2023-05-12 NOTE — ED Triage Notes (Signed)
Pt here from home with c/o resp distress , recently discharged from hospital for same , pt recived 4 nebs by ems , 125mg  solumedrol and 2gm mag and 500 ns and 2 duo nebs

## 2023-05-12 NOTE — ED Provider Notes (Signed)
[place] EMERGENCY DEPARTMENT AT [facility] Provider Note   CSN: [id number] Arrival date & time: 05/12/23  1702     History  Chief Complaint  Patient presents with   Asthma    Carol Hendrix is a 23 y.o. female past medical history significant for asthma presents today for shortness of breath and wheezing.  Patient was seen at [facility] long earlier today for the same where she received 9 albuterol treatments and tested positive for influenza A.  Patient states she was feeling better but then recently began to feel worse which prompted her to return to the ER.  Patient also endorses 3 episodes of emesis with mild epigastric pain which she associates with vomiting and headache.  Patient denies fever, diarrhea, sore throat, or fever.   Asthma Associated symptoms include headaches and shortness of breath.       Home Medications Prior to Admission medications   Medication Sig Start Date End Date Taking? Authorizing Provider  acetaminophen (TYLENOL) 500 MG tablet Take 1,000 mg by mouth every 6 (six) hours as needed for mild pain (pain score 1-3) or headache.    [provider]  albuterol (PROVENTIL) (2.5 MG/3ML) 0.083% nebulizer solution Inhale 3 mLs (2.5 mg total) into the lungs every 4 (four) hours as needed (SOB wheezing). 05/12/23   Pricilla Loveless, MD  albuterol (VENTOLIN HFA) 108 (90 Base) MCG/ACT inhaler Inhale 1-2 puffs into the lungs every 6 (six) hours as needed for wheezing or shortness of breath. 01/20/23   Garrison, [place] N, FNP  benzonatate (TESSALON) 200 MG capsule Take 1 capsule (200 mg total) by mouth 3 (three) times daily as needed for cough. 04/22/23   Fayrene Helper, PA-C  guaiFENesin (MUCINEX) 600 MG 12 hr tablet Take 1 tablet (600 mg total) by mouth 2 (two) times daily for 7 days. 05/07/23 05/14/23  Lorin Glass, MD  HYDROcodone bit-homatropine (HYCODAN) 5-1.5 MG/5ML syrup Take 5 mLs by mouth every 6 (six) hours as needed for up to 6 days for cough.  05/07/23 05/13/23  Lorin Glass, MD  ibuprofen (ADVIL) 800 MG tablet Take 800 mg by mouth every 8 (eight) hours as needed. 03/02/23   [provider]  montelukast (SINGULAIR) 10 MG tablet Take 1 tablet (10 mg total) by mouth at bedtime. 11/11/22   Valentino Nose, NP  oseltamivir (TAMIFLU) 75 MG capsule Take 1 capsule (75 mg total) by mouth every 12 (twelve) hours. 05/12/23   Pricilla Loveless, MD  potassium chloride SA (KLOR-CON M) 20 MEQ tablet Take 1 tablet (20 mEq total) by mouth daily. 05/12/23   Pricilla Loveless, MD  predniSONE (DELTASONE) 20 MG tablet 3 tabs po daily x 3 days, then 2 tabs x 3 days, then 1.5 tabs x 3 days, then 1 tab x 3 days, then 0.5 tabs x 3 days 05/12/23   Pricilla Loveless, MD  ipratropium (ATROVENT) 0.03 % nasal spray Place 2 sprays into both nostrils 2 (two) times daily. Patient not taking: Reported on 07/31/2018 06/21/18 11/13/18  Bing Neighbors, NP      Allergies    Patient has no known allergies.    Review of Systems   Review of Systems  Respiratory:  Positive for shortness of breath and wheezing.   Gastrointestinal:  Positive for nausea and vomiting.  Neurological:  Positive for headaches.    Physical Exam Updated Vital Signs BP 119/77   Pulse (!) 102   Temp 98.5 F (36.9 C) (Oral)   Resp (!) 26  LMP 04/11/2023   SpO2 98%  Physical Exam Vitals and nursing note reviewed.  Constitutional:      General: She is not in acute distress.    Appearance: Normal appearance. She is well-developed. She is not diaphoretic.  HENT:     Head: Normocephalic and atraumatic.     Right Ear: External ear normal.     Left Ear: External ear normal.  Eyes:     Conjunctiva/sclera: Conjunctivae normal.  Cardiovascular:     Rate and Rhythm: Regular rhythm. Tachycardia present.     Pulses: Normal pulses.     Heart sounds: Normal heart sounds. No murmur heard.    Comments: Mildly tachycardic on exam Pulmonary:     Effort: Pulmonary effort is normal. No respiratory  distress.     Breath sounds: Normal breath sounds.     Comments: Scattered wheezes in right lower lobe, mild Patient is able to speak in complete sentences Abdominal:     Palpations: Abdomen is soft.     Tenderness: There is no abdominal tenderness.  Musculoskeletal:        General: No swelling.     Cervical back: Neck supple.  Skin:    General: Skin is warm and dry.     Capillary Refill: Capillary refill takes less than 2 seconds.  Neurological:     General: No focal deficit present.     Mental Status: She is alert.     Motor: No weakness.  Psychiatric:        Mood and Affect: Mood normal.     ED Results / Procedures / Treatments   Labs (all labs ordered are listed, but only abnormal results are displayed) Labs Reviewed - No data to display  EKG None  Radiology DG Chest Portable 1 View Result Date: 05/12/2023 CLINICAL DATA:  Asthma.  Coughing. EXAM: PORTABLE CHEST 1 VIEW COMPARISON:  05/05/2023. FINDINGS: Bilateral lung fields are clear. Bilateral costophrenic angles are clear. Normal cardio-mediastinal silhouette. No acute osseous abnormalities. The soft tissues are within normal limits. IMPRESSION: No active disease. Electronically Signed   By: Jules Schick M.D.   On: 05/12/2023 11:58    Procedures Procedures    Medications Ordered in ED Medications  ibuprofen (ADVIL) tablet 600 mg (600 mg Oral Given 05/12/23 1918)    ED Course/ Medical Decision Making/ A&P                                 Medical Decision Making  This patient presents to the ED with chief complaint(s) of shortness of breath and wheezing with pertinent past medical history of asthma which further complicates the presenting complaint. The complaint involves an extensive differential diagnosis and also carries with it a high risk of complications and morbidity.    The differential diagnosis includes asthma exacerbation, flu, COVID, URI, RSV, pneumonia  Additional history obtained: Records  reviewed Primary Care Documents  ED Course and Reassessment: Reviewed labs and imaging from earlier today at [facility] which showed influenza A positive, leukocytosis at 11.1, hypokalemia 3.0, mildly decreased CO2 at 20, and decreased calcium at 8.  Patient was given 40 mill equivalents of potassium chloride p.o. while at [facility] for hypokalemia.  Patient ambulated well on pulse ox approximately 30 feet down the hallway she was able to maintain SpO2 of 92% but was very short of breath and tachypneic per the nurse tech and she decided to end ambulation as she  felt continue would be unsafe with the patient.  Consultation: - Consulted or discussed management/test interpretation w/ external professional: Hospitalist, Dr. Toniann Fail who is agreeable to admission for asthma exacerbation  Consideration for admission or further workup: Patient being admitted for asthma exacerbation        Final Clinical Impression(s) / ED Diagnoses Final diagnoses:  Moderate persistent asthma with exacerbation    Rx / DC Orders ED Discharge Orders     None         Gretta Began 05/12/23 2143    Lonell Grandchild, MD 05/12/23 2156

## 2023-05-12 NOTE — ED Provider Triage Note (Signed)
Emergency Medicine Provider Triage Evaluation Note  Carol Hendrix , a 23 y.o. female  was evaluated in triage.  Pt complains of shortness of breath..  Review of Systems  Positive: Shortness of breath and wheezing Negative:   Physical Exam  BP 136/83   Pulse (!) 113   Temp 98.7 F (37.1 C)   Resp (!) 22   LMP 04/11/2023   SpO2 98%  Patient with mild wheezing. Medical Decision Making  Medically screening exam initiated at 5:43 PM.  Appropriate orders placed.  Carol Hendrix was informed that the remainder of the evaluation will be completed by another provider, this initial triage assessment does not replace that evaluation, and the importance of remaining in the ED until their evaluation is complete.  Patient shortness of breath.  History of severe asthma.  Recently discharged from [facility] ER today.  States she was flu positive.  States she was feeling better but began to feel worse.  Does have recent admission for similar symptoms and discharged a week ago.   Benjiman Core, MD 05/12/23 1744

## 2023-05-12 NOTE — H&P (Signed)
History and Physical    Carol Hendrix ZOX:[id number] DOB: 2000/07/04 DOA: 05/12/2023  Patient coming from: Home.  Chief Complaint: Shortness of breath.  HPI: Carol Hendrix is a 23 y.o. female with history of asthma who was recently admitted and discharged on 05/07/2023 for asthma exacerbation had come to the ER earlier in the morning yesterday with increasing difficulty breathing and wheezing at the time patient also was found to have flu positive was discharged home on tapering dose of steroids and Tamiflu but when patient went to pick her medication she got more short of breath and presents back to the ER.  ED Course: In the ER patient is found to be diffusely wheezing also complains of some pleuritic type of chest pain.  EKG shows sinus tachycardia with chest x-ray showing nothing acute.  Patient was placed on IV steroids hemolyze admitted for asthma exacerbation.  Review of Systems: As per HPI, rest all negative.   Past Medical History:  Diagnosis Date   Anemia    Asthma    Delivery of twins, both live 12/29/2021   Preterm premature rupture of membranes (PPROM) delivered, current hospitalization 12/27/2021   Preterm premature rupture of membranes (PPROM) with onset of labor after 24 hours of rupture in second trimester, antepartum 11/22/2021   Completed antibiotics and BMZ.  Pt left hospital AMA at 29 weeks due to childcare issues    Past Surgical History:  Procedure Laterality Date   NO PAST SURGERIES       reports that she has never smoked. She has never used smokeless tobacco. She reports current alcohol use. She reports current drug use. Drug: Marijuana.  No Known Allergies  Family History  Problem Relation Age of Onset   Healthy Mother    Healthy Father    Pulmonary fibrosis Maternal Grandmother     Prior to Admission medications   Medication Sig Start Date End Date Taking? Authorizing Provider  albuterol (PROVENTIL) (2.5 MG/3ML) 0.083% nebulizer solution Inhale 3  mLs (2.5 mg total) into the lungs every 4 (four) hours as needed (SOB wheezing). 05/12/23  Yes Pricilla Loveless, MD  albuterol (VENTOLIN HFA) 108 (90 Base) MCG/ACT inhaler Inhale 1-2 puffs into the lungs every 6 (six) hours as needed for wheezing or shortness of breath. 01/20/23  Yes Rinaldo Ratel, [place] N, FNP  acetaminophen (TYLENOL) 500 MG tablet Take 1,000 mg by mouth every 6 (six) hours as needed for mild pain (pain score 1-3) or headache.    [provider]  benzonatate (TESSALON) 200 MG capsule Take 1 capsule (200 mg total) by mouth 3 (three) times daily as needed for cough. 04/22/23   Fayrene Helper, PA-C  guaiFENesin (MUCINEX) 600 MG 12 hr tablet Take 1 tablet (600 mg total) by mouth 2 (two) times daily for 7 days. 05/07/23 05/14/23  Lorin Glass, MD  HYDROcodone bit-homatropine (HYCODAN) 5-1.5 MG/5ML syrup Take 5 mLs by mouth every 6 (six) hours as needed for up to 6 days for cough. 05/07/23 05/13/23  Lorin Glass, MD  ibuprofen (ADVIL) 800 MG tablet Take 800 mg by mouth every 8 (eight) hours as needed. 03/02/23   [provider]  montelukast (SINGULAIR) 10 MG tablet Take 1 tablet (10 mg total) by mouth at bedtime. 11/11/22   Valentino Nose, NP  oseltamivir (TAMIFLU) 75 MG capsule Take 1 capsule (75 mg total) by mouth every 12 (twelve) hours. 05/12/23   Pricilla Loveless, MD  potassium chloride SA (KLOR-CON M) 20 MEQ tablet Take 1 tablet (20 mEq  total) by mouth daily. 05/12/23   Pricilla Loveless, MD  predniSONE (DELTASONE) 20 MG tablet 3 tabs po daily x 3 days, then 2 tabs x 3 days, then 1.5 tabs x 3 days, then 1 tab x 3 days, then 0.5 tabs x 3 days 05/12/23   Pricilla Loveless, MD  ipratropium (ATROVENT) 0.03 % nasal spray Place 2 sprays into both nostrils 2 (two) times daily. Patient not taking: Reported on 07/31/2018 06/21/18 11/13/18  Bing Neighbors, NP    Physical Exam: Constitutional: Moderately built and nourished. Vitals:   05/12/23 1717 05/12/23 2055 05/12/23 2120 05/12/23 2151   BP: 136/83 118/69 119/77   Pulse: (!) 113 96 (!) 102   Resp: (!) 22 (!) 26    Temp: 98.7 F (37.1 C) 98.5 F (36.9 C)  98.7 F (37.1 C)  TempSrc:  Oral  Oral  SpO2: 98% 96% 98%    Eyes: Anicteric no pallor. ENMT: No discharge from the ears/nose or mouth. Neck: No mass felt.  No neck rigidity. Respiratory: Bilateral expiratory wheezes are no crepitations. Cardiovascular: S and S2 heard. Abdomen: Soft nontender bowel sound present. Musculoskeletal: No edema. Skin: No rash. Neurologic: Alert awake oriented time place and person.  Moves all extremities. Psychiatric: Appears normal.  Normal affect.   Labs on Admission: I have personally reviewed following labs and imaging studies  CBC: Recent Labs  Lab 05/12/23 0951  WBC 11.1*  NEUTROABS 9.2*  HGB 12.5  HCT 39.8  MCV 83.4  PLT 201   Basic Metabolic Panel: Recent Labs  Lab 05/12/23 0951  NA 135  K 3.0*  CL 105  CO2 20*  GLUCOSE 139*  BUN 10  CREATININE 0.58  CALCIUM 8.0*   GFR: Estimated Creatinine Clearance: 110.7 mL/min (by C-G formula based on SCr of 0.58 mg/dL). Liver Function Tests: Recent Labs  Lab 05/12/23 0951  AST 15  ALT 14  ALKPHOS 55  BILITOT 0.6  PROT 6.6  ALBUMIN 3.9   No results for input(s): "LIPASE", "AMYLASE" in the last 168 hours. No results for input(s): "AMMONIA" in the last 168 hours. Coagulation Profile: No results for input(s): "INR", "PROTIME" in the last 168 hours. Cardiac Enzymes: No results for input(s): "CKTOTAL", "CKMB", "CKMBINDEX", "TROPONINI" in the last 168 hours. BNP (last 3 results) No results for input(s): "PROBNP" in the last 8760 hours. HbA1C: No results for input(s): "HGBA1C" in the last 72 hours. CBG: No results for input(s): "GLUCAP" in the last 168 hours. Lipid Profile: No results for input(s): "CHOL", "HDL", "LDLCALC", "TRIG", "CHOLHDL", "LDLDIRECT" in the last 72 hours. Thyroid Function Tests: No results for input(s): "TSH", "T4TOTAL", "FREET4",  "T3FREE", "THYROIDAB" in the last 72 hours. Anemia Panel: No results for input(s): "VITAMINB12", "FOLATE", "FERRITIN", "TIBC", "IRON", "RETICCTPCT" in the last 72 hours. Urine analysis:    Component Value Date/Time   COLORURINE YELLOW 03/12/2022 0142   APPEARANCEUR CLEAR 03/12/2022 0142   LABSPEC 1.020 03/12/2022 0142   PHURINE 5.0 03/12/2022 0142   GLUCOSEU NEGATIVE 03/12/2022 0142   HGBUR SMALL (A) 03/12/2022 0142   BILIRUBINUR NEGATIVE 03/12/2022 0142   KETONESUR NEGATIVE 03/12/2022 0142   PROTEINUR NEGATIVE 03/12/2022 0142   UROBILINOGEN 0.2 05/13/2020 1003   NITRITE NEGATIVE 03/12/2022 0142   LEUKOCYTESUR NEGATIVE 03/12/2022 0142   Sepsis Labs: @LABRCNTIP (procalcitonin:4,lacticidven:4) ) Recent Results (from the past 240 hours)  Resp panel by RT-PCR (RSV, Flu A&B, Covid) Anterior Nasal Swab     Status: None   Collection Time: 05/05/23 10:42 PM   Specimen: Anterior Nasal  Swab  Result Value Ref Range Status   SARS Coronavirus 2 by RT PCR NEGATIVE NEGATIVE Final   Influenza A by PCR NEGATIVE NEGATIVE Final   Influenza B by PCR NEGATIVE NEGATIVE Final    Comment: (NOTE) The Xpert Xpress SARS-CoV-2/FLU/RSV plus assay is intended as an aid in the diagnosis of influenza from Nasopharyngeal swab specimens and should not be used as a sole basis for treatment. Nasal washings and aspirates are unacceptable for Xpert Xpress SARS-CoV-2/FLU/RSV testing.  Fact Sheet for Patients: BloggerCourse.com  Fact Sheet for Healthcare Providers: SeriousBroker.it  This test is not yet approved or cleared by the [place] FDA and has been authorized for detection and/or diagnosis of SARS-CoV-2 by FDA under an Emergency Use Authorization (EUA). This EUA will remain in effect (meaning this test can be used) for the duration of the COVID-19 declaration under Section 564(b)(1) of the Act, 21 U.S.C. section 360bbb-3(b)(1), unless the  authorization is terminated or revoked.     Resp Syncytial Virus by PCR NEGATIVE NEGATIVE Final    Comment: (NOTE) Fact Sheet for Patients: BloggerCourse.com  Fact Sheet for Healthcare Providers: SeriousBroker.it  This test is not yet approved or cleared by the [place] FDA and has been authorized for detection and/or diagnosis of SARS-CoV-2 by FDA under an Emergency Use Authorization (EUA). This EUA will remain in effect (meaning this test can be used) for the duration of the COVID-19 declaration under Section 564(b)(1) of the Act, 21 U.S.C. section 360bbb-3(b)(1), unless the authorization is terminated or revoked.  Performed at [facility], [address] [place], [place] [address]   Resp panel by RT-PCR (RSV, Flu A&B, Covid) Anterior Nasal Swab     Status: Abnormal   Collection Time: 05/12/23  9:51 AM   Specimen: Anterior Nasal Swab  Result Value Ref Range Status   SARS Coronavirus 2 by RT PCR NEGATIVE NEGATIVE Final    Comment: (NOTE) SARS-CoV-2 target nucleic acids are NOT DETECTED.  The SARS-CoV-2 RNA is generally detectable in upper respiratory specimens during the acute phase of infection. The lowest concentration of SARS-CoV-2 viral copies this assay can detect is 138 copies/mL. A negative result does not preclude SARS-Cov-2 infection and should not be used as the sole basis for treatment or other patient management decisions. A negative result may occur with  improper specimen collection/handling, submission of specimen other than nasopharyngeal swab, presence of viral mutation(s) within the areas targeted by this assay, and inadequate number of viral copies(<138 copies/mL). A negative result must be combined with clinical observations, patient history, and epidemiological information. The expected result is Negative.  Fact Sheet for Patients:  BloggerCourse.com  Fact Sheet  for Healthcare Providers:  SeriousBroker.it  This test is no t yet approved or cleared by the [place] FDA and  has been authorized for detection and/or diagnosis of SARS-CoV-2 by FDA under an Emergency Use Authorization (EUA). This EUA will remain  in effect (meaning this test can be used) for the duration of the COVID-19 declaration under Section 564(b)(1) of the Act, 21 U.S.C.section 360bbb-3(b)(1), unless the authorization is terminated  or revoked sooner.       Influenza A by PCR POSITIVE (A) NEGATIVE Final   Influenza B by PCR NEGATIVE NEGATIVE Final    Comment: (NOTE) The Xpert Xpress SARS-CoV-2/FLU/RSV plus assay is intended as an aid in the diagnosis of influenza from Nasopharyngeal swab specimens and should not be used as a sole basis for treatment. Nasal washings and aspirates are unacceptable  for Xpert Xpress SARS-CoV-2/FLU/RSV testing.  Fact Sheet for Patients: BloggerCourse.com  Fact Sheet for Healthcare Providers: SeriousBroker.it  This test is not yet approved or cleared by the [place] FDA and has been authorized for detection and/or diagnosis of SARS-CoV-2 by FDA under an Emergency Use Authorization (EUA). This EUA will remain in effect (meaning this test can be used) for the duration of the COVID-19 declaration under Section 564(b)(1) of the Act, 21 U.S.C. section 360bbb-3(b)(1), unless the authorization is terminated or revoked.     Resp Syncytial Virus by PCR NEGATIVE NEGATIVE Final    Comment: (NOTE) Fact Sheet for Patients: BloggerCourse.com  Fact Sheet for Healthcare Providers: SeriousBroker.it  This test is not yet approved or cleared by the [place] FDA and has been authorized for detection and/or diagnosis of SARS-CoV-2 by FDA under an Emergency Use Authorization (EUA). This EUA will remain in effect  (meaning this test can be used) for the duration of the COVID-19 declaration under Section 564(b)(1) of the Act, 21 U.S.C. section 360bbb-3(b)(1), unless the authorization is terminated or revoked.  Performed at [facility], [address], [place], [place] [address]      Radiological Exams on Admission: DG Chest Portable 1 View Result Date: 05/12/2023 CLINICAL DATA:  Asthma.  Coughing. EXAM: PORTABLE CHEST 1 VIEW COMPARISON:  05/05/2023. FINDINGS: Bilateral lung fields are clear. Bilateral costophrenic angles are clear. Normal cardio-mediastinal silhouette. No acute osseous abnormalities. The soft tissues are within normal limits. IMPRESSION: No active disease. Electronically Signed   By: Jules Schick M.D.   On: 05/12/2023 11:58    EKG: Independently reviewed.  Sinus tachycardia.  Assessment/Plan Principal Problem:   Asthma exacerbation Active Problems:   Moderate persistent asthma   Influenza A   Hypokalemia    Asthma exacerbation patient placed on IV Solu-Medrol and scheduled and as needed nebulizer.  Able to complete sentences without difficulty.  Patient has been having recently recurrent episodes of asthma exacerbation and only takes albuterol as needed.  Will need follow-up with primary care patient likely will need to be on steroid inhalers.  Since patient is complaining of pleuritic type of chest pain will check D-dimer.  EKG shows sinus tachycardia. Influenza A infection for which patient has been started on Tamiflu. Hypokalemia replace recheck.  Since patient has asthma exacerbation with influenza A infection would likely need more than 2 midnight stay.   DVT prophylaxis: Enoxaparin Code Status: Full code. Family Communication: Discussed with patient. Disposition Plan: Monitored bed. Consults called: None. Admission status: Observation.

## 2023-05-12 NOTE — ED Provider Notes (Signed)
[place] EMERGENCY DEPARTMENT AT [facility] Provider Note   CSN: [id number] Arrival date & time: 05/12/23  4098     History  Chief Complaint  Patient presents with   Respiratory Distress    Carol Hendrix is a 23 y.o. female.  HPI 23 year old female history of asthma presents with an asthma exacerbation.  History is from EMS and patient.  Patient was recently admitted and was prescribed prednisone but there was an issue with the prescription wording and so she was unable to get it filled and was supposed to come back for repeat prescription today.  However, last night started having a worsening cough that was lingering from last admission and started feeling short of breath and having wheezing.  She has been doing multiple albuterol nebs and then her symptoms were worse this morning so she called EMS.  EMS reports that she seemed to have significant improvement after she had gotten a few treatments, Solu-Medrol, and IV magnesium.  However still never fully turned around and so they transported her here.  Patient states she is feeling a lot better though not back to baseline.  She has chest pain when she coughs but no chest pain at rest otherwise. She has not smoked since leaving the hospital.  Home Medications Prior to Admission medications   Medication Sig Start Date End Date Taking? Authorizing Provider  oseltamivir (TAMIFLU) 75 MG capsule Take 1 capsule (75 mg total) by mouth every 12 (twelve) hours. 05/12/23  Yes Pricilla Loveless, MD  potassium chloride SA (KLOR-CON M) 20 MEQ tablet Take 1 tablet (20 mEq total) by mouth daily. 05/12/23  Yes Pricilla Loveless, MD  predniSONE (DELTASONE) 20 MG tablet 3 tabs po daily x 3 days, then 2 tabs x 3 days, then 1.5 tabs x 3 days, then 1 tab x 3 days, then 0.5 tabs x 3 days 05/12/23  Yes Pricilla Loveless, MD  acetaminophen (TYLENOL) 500 MG tablet Take 1,000 mg by mouth every 6 (six) hours as needed for mild pain (pain score 1-3) or headache.     [provider]  albuterol (PROVENTIL) (2.5 MG/3ML) 0.083% nebulizer solution Inhale 3 mLs (2.5 mg total) into the lungs every 4 (four) hours as needed (SOB wheezing). 05/12/23   Pricilla Loveless, MD  albuterol (VENTOLIN HFA) 108 (90 Base) MCG/ACT inhaler Inhale 1-2 puffs into the lungs every 6 (six) hours as needed for wheezing or shortness of breath. 01/20/23   Garrison, [place] N, FNP  benzonatate (TESSALON) 200 MG capsule Take 1 capsule (200 mg total) by mouth 3 (three) times daily as needed for cough. 04/22/23   Fayrene Helper, PA-C  guaiFENesin (MUCINEX) 600 MG 12 hr tablet Take 1 tablet (600 mg total) by mouth 2 (two) times daily for 7 days. 05/07/23 05/14/23  Lorin Glass, MD  HYDROcodone bit-homatropine (HYCODAN) 5-1.5 MG/5ML syrup Take 5 mLs by mouth every 6 (six) hours as needed for up to 6 days for cough. 05/07/23 05/13/23  Lorin Glass, MD  ibuprofen (ADVIL) 800 MG tablet Take 800 mg by mouth every 8 (eight) hours as needed. 03/02/23   [provider]  montelukast (SINGULAIR) 10 MG tablet Take 1 tablet (10 mg total) by mouth at bedtime. 11/11/22   Valentino Nose, NP  ipratropium (ATROVENT) 0.03 % nasal spray Place 2 sprays into both nostrils 2 (two) times daily. Patient not taking: Reported on 07/31/2018 06/21/18 11/13/18  Bing Neighbors, NP      Allergies    Patient has  no known allergies.    Review of Systems   Review of Systems  Constitutional:  Negative for fever.  Respiratory:  Positive for cough, shortness of breath and wheezing.   Cardiovascular:  Negative for chest pain and leg swelling.    Physical Exam Updated Vital Signs BP 116/70   Pulse 99   Temp 98.3 F (36.8 C) (Oral)   Resp (!) 21   LMP 04/11/2023   SpO2 96%  Physical Exam Vitals and nursing note reviewed.  Constitutional:      General: She is not in acute distress.    Appearance: She is well-developed. She is not ill-appearing or diaphoretic.  HENT:     Head: Normocephalic and  atraumatic.  Cardiovascular:     Rate and Rhythm: Regular rhythm. Tachycardia present.     Heart sounds: Normal heart sounds.  Pulmonary:     Effort: Pulmonary effort is normal. No accessory muscle usage.     Breath sounds: Wheezing present.     Comments: Scattered wheezes, mild Able to speak in complete sentences Abdominal:     Palpations: Abdomen is soft.     Tenderness: There is no abdominal tenderness.  Musculoskeletal:     Right lower leg: No edema.     Left lower leg: No edema.  Skin:    General: Skin is warm and dry.  Neurological:     Mental Status: She is alert.     ED Results / Procedures / Treatments   Labs (all labs ordered are listed, but only abnormal results are displayed) Labs Reviewed  RESP PANEL BY RT-PCR (RSV, FLU A&B, COVID)  RVPGX2 - Abnormal; Notable for the following components:      Result Value   Influenza A by PCR POSITIVE (*)    All other components within normal limits  COMPREHENSIVE METABOLIC PANEL - Abnormal; Notable for the following components:   Potassium 3.0 (*)    CO2 20 (*)    Glucose, Bld 139 (*)    Calcium 8.0 (*)    All other components within normal limits  CBC WITH DIFFERENTIAL/PLATELET - Abnormal; Notable for the following components:   WBC 11.1 (*)    Neutro Abs 9.2 (*)    All other components within normal limits  HCG, SERUM, QUALITATIVE    EKG EKG Interpretation Date/Time:  Friday May 12 2023 10:16:32 EST Ventricular Rate:  101 PR Interval:  143 QRS Duration:  88 QT Interval:  338 QTC Calculation: 439 R Axis:   53  Text Interpretation: Sinus tachycardia Borderline T abnormalities, diffuse leads Confirmed by Pricilla Loveless [phone]) on 05/12/2023 10:30:49 AM  Radiology DG Chest Portable 1 View Result Date: 05/12/2023 CLINICAL DATA:  Asthma.  Coughing. EXAM: PORTABLE CHEST 1 VIEW COMPARISON:  05/05/2023. FINDINGS: Bilateral lung fields are clear. Bilateral costophrenic angles are clear. Normal cardio-mediastinal  silhouette. No acute osseous abnormalities. The soft tissues are within normal limits. IMPRESSION: No active disease. Electronically Signed   By: Jules Schick M.D.   On: 05/12/2023 11:58    Procedures Procedures    Medications Ordered in ED Medications  oseltamivir (TAMIFLU) capsule 75 mg (has no administration in time range)  sodium chloride 0.9 % bolus 1,000 mL (0 mLs Intravenous Stopped 05/12/23 1133)  HYDROcodone bit-homatropine (HYCODAN) 5-1.5 MG/5ML syrup 5 mL (5 mLs Oral Given 05/12/23 1131)  potassium chloride SA (KLOR-CON M) CR tablet 40 mEq (40 mEq Oral Given 05/12/23 1103)  guaiFENesin (MUCINEX) 12 hr tablet 600 mg (600 mg Oral Given 05/12/23 1103)  ED Course/ Medical Decision Making/ A&P                                 Medical Decision Making Amount and/or Complexity of Data Reviewed External Data Reviewed: notes. Labs: ordered.    Details: Influenza A test positive.  Hypokalemia to 3.0 Radiology: ordered and independent interpretation performed.    Details: No pneumonia ECG/medicine tests: ordered and independent interpretation performed.    Details: Sinus tachycardia  Risk OTC drugs. Prescription drug management.   Patient presents with recurrent asthma exacerbation.  Here she is feeling better and while her heart rate is high, I suspect that is due to the degree of how much albuterol she has received.  With some fluids and time her heart rate has now normalized.  She feels better and on recheck I hear no wheezing.  Given she feels well now for discharge I think is reasonable to discharge her with steroids.  Will do a 2-week taper given the multiple asthma exacerbations and not getting prednisone last time.  Will also refill her albuterol she states she is running low.  Her flu test is positive, I suspect this is the cause of her current exacerbation and after discussion she would like to start Tamiflu.  She has some mild low potassium, which I think is probably at  least partially due to the degree of breathing treatments but I think replacing it is warranted as well.  At this point, she would like to go home which I think is reasonable, given a work note and is stable for discharge with return precautions.       Final Clinical Impression(s) / ED Diagnoses Final diagnoses:  Influenza A  Exacerbation of asthma, unspecified asthma severity, unspecified whether persistent  Hypokalemia    Rx / DC Orders ED Discharge Orders          Ordered    albuterol (PROVENTIL) (2.5 MG/3ML) 0.083% nebulizer solution  Every 4 hours PRN        05/12/23 1245    predniSONE (DELTASONE) 20 MG tablet        05/12/23 1245    oseltamivir (TAMIFLU) 75 MG capsule  Every 12 hours        05/12/23 1245    potassium chloride SA (KLOR-CON M) 20 MEQ tablet  Daily        05/12/23 1254              Pricilla Loveless, MD 05/12/23 1256

## 2023-05-12 NOTE — ED Notes (Addendum)
Patient ambulated with this tech 30 feet in the hallway. Pulse oximetry dropped to 92% room air during ambulation. Patient became very short of breath during ambulation & was returned to bed immediately. Prior to ambulating, pulse oximetry was 95% on room air.

## 2023-05-12 NOTE — ED Triage Notes (Signed)
Pt was just as  for the same thing, She received 9 albuterol treatments. Tested positive for flu at [facility] vitals   133/67 123hr after the albuterol 24rr 100% on neb

## 2023-05-13 ENCOUNTER — Other Ambulatory Visit: Payer: Self-pay

## 2023-05-13 DIAGNOSIS — J4541 Moderate persistent asthma with (acute) exacerbation: Secondary | ICD-10-CM | POA: Diagnosis not present

## 2023-05-13 DIAGNOSIS — E876 Hypokalemia: Secondary | ICD-10-CM | POA: Diagnosis not present

## 2023-05-13 DIAGNOSIS — J101 Influenza due to other identified influenza virus with other respiratory manifestations: Secondary | ICD-10-CM | POA: Diagnosis not present

## 2023-05-13 LAB — CBC
HCT: 40.7 % (ref 36.0–46.0)
Hemoglobin: 13.4 g/dL (ref 12.0–15.0)
MCH: 26.5 pg (ref 26.0–34.0)
MCHC: 32.9 g/dL (ref 30.0–36.0)
MCV: 80.6 fL (ref 80.0–100.0)
Platelets: 242 10*3/uL (ref 150–400)
RBC: 5.05 MIL/uL (ref 3.87–5.11)
RDW: 15.3 % (ref 11.5–15.5)
WBC: 13.4 10*3/uL — ABNORMAL HIGH (ref 4.0–10.5)
nRBC: 0 % (ref 0.0–0.2)

## 2023-05-13 LAB — CREATININE, SERUM
Creatinine, Ser: 0.59 mg/dL (ref 0.44–1.00)
GFR, Estimated: >60 mL/min (ref >60)

## 2023-05-13 LAB — HIV ANTIBODY (ROUTINE TESTING W REFLEX): HIV Screen 4th Generation wRfx: NONREACTIVE

## 2023-05-13 LAB — D-DIMER, QUANTITATIVE: D-Dimer, Quant: <0.27 ug{FEU}/mL (ref 0.00–0.50)

## 2023-05-13 MED ORDER — PREDNISONE 20 MG PO TABS: 40.0000 mg | ORAL_TABLET | Freq: Every day | ORAL | 0 refills | Status: AC

## 2023-05-13 MED ORDER — ALBUTEROL SULFATE HFA 108 (90 BASE) MCG/ACT IN AERS: 2.0000 | INHALATION_SPRAY | Freq: Four times a day (QID) | RESPIRATORY_TRACT | 0 refills | Status: AC | PRN

## 2023-05-13 MED ORDER — PREDNISONE 20 MG PO TABS
40.0000 mg | ORAL_TABLET | ORAL | Status: AC
  Administered 2023-05-13: 40 mg via ORAL
  Filled 2023-05-13: qty 2

## 2023-05-13 MED ORDER — BUDESONIDE-FORMOTEROL FUMARATE 160-4.5 MCG/ACT IN AERO: 2.0000 | INHALATION_SPRAY | Freq: Two times a day (BID) | RESPIRATORY_TRACT | 12 refills | Status: DC

## 2023-05-13 MED ORDER — ALBUTEROL SULFATE (2.5 MG/3ML) 0.083% IN NEBU: 2.5000 mg | INHALATION_SOLUTION | RESPIRATORY_TRACT | 0 refills | Status: DC | PRN

## 2023-05-13 MED ORDER — MONTELUKAST SODIUM 10 MG PO TABS: 10.0000 mg | ORAL_TABLET | Freq: Every day | ORAL | 0 refills | Status: AC

## 2023-05-13 NOTE — Care Management (Signed)
Transition of Care [facility]) - Emergency Department Mini Assessment   Patient Details  Name: Carol Hendrix MRN: [id number] Date of Birth: Dec 23, 2000  Transition of [facility]) CM/SW Contact:    Lockie Pares, RN Phone Number: 05/13/2023, 12:55 PM   Clinical Narrative:  Patient requests a nebulizer, Ordered from [employer]. This will be delivered to room prior to DC Rn aware  ED Mini Assessment: What brought you to the Emergency Department? : Asthma  Barriers to Discharge: ED DME delivery        Interventions which prevented an admission or readmission: DME Provided    Patient Contact and Communications        ,                 Admission diagnosis:  Asthma exacerbation [J45.901] Patient Active Problem List   Diagnosis Date Noted   Influenza A 05/12/2023   Hypokalemia 05/12/2023   Asthma exacerbation 05/06/2023   Postpartum care following vaginal delivery 03/22/2022   Contraception management 03/22/2022   Syncope 08/11/2021   Moderate persistent asthma 03/25/2018   Tobacco use disorder 03/25/2018   PCP:  Default, Provider, MD Pharmacy:   [id number] [place], [place] - [address] AT [facility] OF [address] [place] [id number] Phone: [phone] Fax: [phone]

## 2023-05-13 NOTE — Discharge Summary (Signed)
[facility] Physician Discharge Summary   Patient name: Carol Hendrix  Admit date:     05/12/2023  Discharge date: 05/13/2023  Attending Physician: Eduard Clos [[id number]]  Discharge Physician: Carollee Herter   PCP: Default, Provider, MD  Admitted From: Home  Disposition:  Home  Recommendations for Outpatient Follow-up:  Follow up with PCP in 1-2 weeks Please follow up on the following pending results:  Home Health:No Equipment/Devices: Home nebulizer machine    Discharge Condition:Stable CODE STATUS:FULL Diet recommendation: Regular Fluid Restriction: None  Hospital Summary: HPI: Carol Hendrix is a 23 y.o. female with history of asthma who was recently admitted and discharged on 05/07/2023 for asthma exacerbation had come to the ER earlier in the morning yesterday with increasing difficulty breathing and wheezing at the time patient also was found to have flu positive was discharged home on tapering dose of steroids and Tamiflu but when patient went to pick her medication she got more short of breath and presents back to the ER.   ED Course: In the ER patient is found to be diffusely wheezing also complains of some pleuritic type of chest pain.  EKG shows sinus tachycardia with chest x-ray showing nothing acute.  Patient was placed on IV steroids hemolyze admitted for asthma exacerbation  Significant Events: Admitted 05/12/2023 for asthma exacerbation and influenza A   Significant Labs: WBC 13.4, HgB 13.4  Significant Imaging Studies:   Antibiotic Therapy: Anti-infectives (From admission, onward)    Start     Dose/Rate Route Frequency Ordered Stop   05/13/23 0000  oseltamivir (TAMIFLU) capsule 75 mg        75 mg Oral Every 12 hours 05/12/23 2347 05/17/23 1159       Procedures:   Consultants:    Hospital Course by Problem: * Asthma exacerbation On RA. Still dc to home on po prednisone 40 mg daily for 7 days. Will refill Rx for albuterol inh and nebs.  Will need to start ICS/LABA. TOC consult for new home nebulizer machine. Pt states her old machine isn't working properly.  Influenza A Outside window for tamiflu to be effective. On RA. Continue supportive care at home.  Moderate persistent asthma Start ICS/LABA. Needs referral to PCP.  Hypokalemia Treated with po kcl.    Discharge Diagnoses:  Principal Problem:   Asthma exacerbation Active Problems:   Moderate persistent asthma   Influenza A   Hypokalemia   Discharge Instructions  Discharge Instructions     Call MD for:  difficulty breathing, headache or visual disturbances   Complete by: As directed    Call MD for:  extreme fatigue   Complete by: As directed    Call MD for:  persistant dizziness or light-headedness   Complete by: As directed    Call MD for:  temperature >100.4   Complete by: As directed    Diet general   Complete by: As directed    Discharge instructions   Complete by: As directed    1. New primary care appointment will be made for you for asthma management.   Increase activity slowly   Complete by: As directed       Allergies as of 05/13/2023   No Known Allergies      Medication List     STOP taking these medications    HYDROcodone bit-homatropine 5-1.5 MG/5ML syrup Commonly known as: HYCODAN   oseltamivir 75 MG capsule Commonly known as: TAMIFLU   potassium chloride SA 20 MEQ tablet Commonly known  asJerene Dilling       TAKE these medications    acetaminophen 500 MG tablet Commonly known as: TYLENOL Take 1,000 mg by mouth every 6 (six) hours as needed for mild pain (pain score 1-3) or headache.   albuterol (2.5 MG/3ML) 0.083% nebulizer solution Commonly known as: PROVENTIL Inhale 3 mLs (2.5 mg total) into the lungs every 4 (four) hours as needed (SOB wheezing). What changed: Another medication with the same name was changed. Make sure you understand how and when to take each.   albuterol 108 (90 Base) MCG/ACT  inhaler Commonly known as: VENTOLIN HFA Inhale 2 puffs into the lungs every 6 (six) hours as needed for wheezing or shortness of breath. What changed: how much to take   benzonatate 200 MG capsule Commonly known as: TESSALON Take 1 capsule (200 mg total) by mouth 3 (three) times daily as needed for cough.   budesonide-formoterol 160-4.5 MCG/ACT inhaler Commonly known as: Symbicort Inhale 2 puffs into the lungs in the morning and at bedtime.   guaiFENesin 600 MG 12 hr tablet Commonly known as: MUCINEX Take 1 tablet (600 mg total) by mouth 2 (two) times daily for 7 days.   ibuprofen 800 MG tablet Commonly known as: ADVIL Take 800 mg by mouth every 8 (eight) hours as needed for moderate pain (pain score 4-6), headache or fever.   montelukast 10 MG tablet Commonly known as: Singulair Take 1 tablet (10 mg total) by mouth at bedtime.   predniSONE 20 MG tablet Commonly known as: DELTASONE Take 2 tablets (40 mg total) by mouth daily with breakfast for 7 doses. What changed:  how much to take how to take this when to take this additional instructions               Durable Medical Equipment  (From admission, onward)           Start     Ordered   05/13/23 1149  For home use only DME Nebulizer machine  Once       Question Answer Comment  Patient needs a nebulizer to treat with the following condition Asthma   Length of Need Lifetime   Additional equipment included Administration kit   Additional equipment included Filter      05/13/23 1148            Follow-up Information     [facility] Follow up.   Why: Call Monday to schedule a Hospital Follow-up apptment and to establish priamry care Contact information: [address] [place] [address] [phone]               No Known Allergies  Discharge Exam: Vitals:   05/13/23 1014 05/13/23 1100  BP:  121/81  Pulse:  91  Resp:  (!) 21  Temp: 98.2 F  (36.8 C)   SpO2:  99%    Physical Exam Vitals and nursing note reviewed.  Constitutional:      General: She is not in acute distress.    Appearance: She is normal weight. She is not toxic-appearing or diaphoretic.  HENT:     Head: Normocephalic and atraumatic.     Nose: Nose normal.  Eyes:     General: No scleral icterus. Cardiovascular:     Rate and Rhythm: Normal rate and regular rhythm.     Pulses: Normal pulses.  Pulmonary:     Effort: Pulmonary effort is normal. No respiratory distress.     Comments:  Scattered end expiratory wheezing. Good air movement. Abdominal:     General: Abdomen is flat. Bowel sounds are normal.     Palpations: Abdomen is soft.  Musculoskeletal:     Right lower leg: No edema.     Left lower leg: No edema.  Skin:    General: Skin is warm and dry.     Capillary Refill: Capillary refill takes less than 2 seconds.  Neurological:     General: No focal deficit present.     Mental Status: She is alert and oriented to person, place, and time.     The results of significant diagnostics from this hospitalization (including imaging, microbiology, ancillary and laboratory) are listed below for reference.    Microbiology: Recent Results (from the past 240 hours)  Resp panel by RT-PCR (RSV, Flu A&B, Covid) Anterior Nasal Swab     Status: None   Collection Time: 05/05/23 10:42 PM   Specimen: Anterior Nasal Swab  Result Value Ref Range Status   SARS Coronavirus 2 by RT PCR NEGATIVE NEGATIVE Final   Influenza A by PCR NEGATIVE NEGATIVE Final   Influenza B by PCR NEGATIVE NEGATIVE Final    Comment: (NOTE) The Xpert Xpress SARS-CoV-2/FLU/RSV plus assay is intended as an aid in the diagnosis of influenza from Nasopharyngeal swab specimens and should not be used as a sole basis for treatment. Nasal washings and aspirates are unacceptable for Xpert Xpress SARS-CoV-2/FLU/RSV testing.  Fact Sheet for  Patients: BloggerCourse.com  Fact Sheet for Healthcare Providers: SeriousBroker.it  This test is not yet approved or cleared by the [place] FDA and has been authorized for detection and/or diagnosis of SARS-CoV-2 by FDA under an Emergency Use Authorization (EUA). This EUA will remain in effect (meaning this test can be used) for the duration of the COVID-19 declaration under Section 564(b)(1) of the Act, 21 U.S.C. section 360bbb-3(b)(1), unless the authorization is terminated or revoked.     Resp Syncytial Virus by PCR NEGATIVE NEGATIVE Final    Comment: (NOTE) Fact Sheet for Patients: BloggerCourse.com  Fact Sheet for Healthcare Providers: SeriousBroker.it  This test is not yet approved or cleared by the [place] FDA and has been authorized for detection and/or diagnosis of SARS-CoV-2 by FDA under an Emergency Use Authorization (EUA). This EUA will remain in effect (meaning this test can be used) for the duration of the COVID-19 declaration under Section 564(b)(1) of the Act, 21 U.S.C. section 360bbb-3(b)(1), unless the authorization is terminated or revoked.  Performed at [facility], [address] [place], [place] [address]   Resp panel by RT-PCR (RSV, Flu A&B, Covid) Anterior Nasal Swab     Status: Abnormal   Collection Time: 05/12/23  9:51 AM   Specimen: Anterior Nasal Swab  Result Value Ref Range Status   SARS Coronavirus 2 by RT PCR NEGATIVE NEGATIVE Final    Comment: (NOTE) SARS-CoV-2 target nucleic acids are NOT DETECTED.  The SARS-CoV-2 RNA is generally detectable in upper respiratory specimens during the acute phase of infection. The lowest concentration of SARS-CoV-2 viral copies this assay can detect is 138 copies/mL. A negative result does not preclude SARS-Cov-2 infection and should not be used as the sole basis for treatment or other  patient management decisions. A negative result may occur with  improper specimen collection/handling, submission of specimen other than nasopharyngeal swab, presence of viral mutation(s) within the areas targeted by this assay, and inadequate number of viral copies(<138 copies/mL). A negative result must be combined with clinical  observations, patient history, and epidemiological information. The expected result is Negative.  Fact Sheet for Patients:  BloggerCourse.com  Fact Sheet for Healthcare Providers:  SeriousBroker.it  This test is no t yet approved or cleared by the [place] FDA and  has been authorized for detection and/or diagnosis of SARS-CoV-2 by FDA under an Emergency Use Authorization (EUA). This EUA will remain  in effect (meaning this test can be used) for the duration of the COVID-19 declaration under Section 564(b)(1) of the Act, 21 U.S.C.section 360bbb-3(b)(1), unless the authorization is terminated  or revoked sooner.       Influenza A by PCR POSITIVE (A) NEGATIVE Final   Influenza B by PCR NEGATIVE NEGATIVE Final    Comment: (NOTE) The Xpert Xpress SARS-CoV-2/FLU/RSV plus assay is intended as an aid in the diagnosis of influenza from Nasopharyngeal swab specimens and should not be used as a sole basis for treatment. Nasal washings and aspirates are unacceptable for Xpert Xpress SARS-CoV-2/FLU/RSV testing.  Fact Sheet for Patients: BloggerCourse.com  Fact Sheet for Healthcare Providers: SeriousBroker.it  This test is not yet approved or cleared by the [place] FDA and has been authorized for detection and/or diagnosis of SARS-CoV-2 by FDA under an Emergency Use Authorization (EUA). This EUA will remain in effect (meaning this test can be used) for the duration of the COVID-19 declaration under Section 564(b)(1) of the Act, 21 U.S.C. section  360bbb-3(b)(1), unless the authorization is terminated or revoked.     Resp Syncytial Virus by PCR NEGATIVE NEGATIVE Final    Comment: (NOTE) Fact Sheet for Patients: BloggerCourse.com  Fact Sheet for Healthcare Providers: SeriousBroker.it  This test is not yet approved or cleared by the [place] FDA and has been authorized for detection and/or diagnosis of SARS-CoV-2 by FDA under an Emergency Use Authorization (EUA). This EUA will remain in effect (meaning this test can be used) for the duration of the COVID-19 declaration under Section 564(b)(1) of the Act, 21 U.S.C. section 360bbb-3(b)(1), unless the authorization is terminated or revoked.  Performed at [facility], [address], [place], [place] [address]      Labs: Basic Metabolic Panel: Recent Labs  Lab 05/12/23 0951 05/13/23 0012  NA 135  --   K 3.0*  --   CL 105  --   CO2 20*  --   GLUCOSE 139*  --   BUN 10  --   CREATININE 0.58 0.59  CALCIUM 8.0*  --    Liver Function Tests: Recent Labs  Lab 05/12/23 0951  AST 15  ALT 14  ALKPHOS 55  BILITOT 0.6  PROT 6.6  ALBUMIN 3.9   CBC: Recent Labs  Lab 05/12/23 0951 05/13/23 0012  WBC 11.1* 13.4*  NEUTROABS 9.2*  --   HGB 12.5 13.4  HCT 39.8 40.7  MCV 83.4 80.6  PLT 201 242   D-Dimer Recent Labs    05/13/23 0012  DDIMER <0.27   Urinalysis    Component Value Date/Time   COLORURINE YELLOW 03/12/2022 0142   APPEARANCEUR CLEAR 03/12/2022 0142   LABSPEC 1.020 03/12/2022 0142   PHURINE 5.0 03/12/2022 0142   GLUCOSEU NEGATIVE 03/12/2022 0142   HGBUR SMALL (A) 03/12/2022 0142   BILIRUBINUR NEGATIVE 03/12/2022 0142   KETONESUR NEGATIVE 03/12/2022 0142   PROTEINUR NEGATIVE 03/12/2022 0142   UROBILINOGEN 0.2 05/13/2020 1003   NITRITE NEGATIVE 03/12/2022 0142   LEUKOCYTESUR NEGATIVE 03/12/2022 0142   Sepsis Labs Recent Labs  Lab 05/12/23 0951 05/13/23 0012  WBC 11.1*  13.4*   Microbiology Recent Results (from the past 240 hours)  Resp panel by RT-PCR (RSV, Flu A&B, Covid) Anterior Nasal Swab     Status: None   Collection Time: 05/05/23 10:42 PM   Specimen: Anterior Nasal Swab  Result Value Ref Range Status   SARS Coronavirus 2 by RT PCR NEGATIVE NEGATIVE Final   Influenza A by PCR NEGATIVE NEGATIVE Final   Influenza B by PCR NEGATIVE NEGATIVE Final    Comment: (NOTE) The Xpert Xpress SARS-CoV-2/FLU/RSV plus assay is intended as an aid in the diagnosis of influenza from Nasopharyngeal swab specimens and should not be used as a sole basis for treatment. Nasal washings and aspirates are unacceptable for Xpert Xpress SARS-CoV-2/FLU/RSV testing.  Fact Sheet for Patients: BloggerCourse.com  Fact Sheet for Healthcare Providers: SeriousBroker.it  This test is not yet approved or cleared by the [place] FDA and has been authorized for detection and/or diagnosis of SARS-CoV-2 by FDA under an Emergency Use Authorization (EUA). This EUA will remain in effect (meaning this test can be used) for the duration of the COVID-19 declaration under Section 564(b)(1) of the Act, 21 U.S.C. section 360bbb-3(b)(1), unless the authorization is terminated or revoked.     Resp Syncytial Virus by PCR NEGATIVE NEGATIVE Final    Comment: (NOTE) Fact Sheet for Patients: BloggerCourse.com  Fact Sheet for Healthcare Providers: SeriousBroker.it  This test is not yet approved or cleared by the [place] FDA and has been authorized for detection and/or diagnosis of SARS-CoV-2 by FDA under an Emergency Use Authorization (EUA). This EUA will remain in effect (meaning this test can be used) for the duration of the COVID-19 declaration under Section 564(b)(1) of the Act, 21 U.S.C. section 360bbb-3(b)(1), unless the authorization is terminated or revoked.  Performed  at [facility], [address], [place], [place] [address]   Resp panel by RT-PCR (RSV, Flu A&B, Covid) Anterior Nasal Swab     Status: Abnormal   Collection Time: 05/12/23  9:51 AM   Specimen: Anterior Nasal Swab  Result Value Ref Range Status   SARS Coronavirus 2 by RT PCR NEGATIVE NEGATIVE Final    Comment: (NOTE) SARS-CoV-2 target nucleic acids are NOT DETECTED.  The SARS-CoV-2 RNA is generally detectable in upper respiratory specimens during the acute phase of infection. The lowest concentration of SARS-CoV-2 viral copies this assay can detect is 138 copies/mL. A negative result does not preclude SARS-Cov-2 infection and should not be used as the sole basis for treatment or other patient management decisions. A negative result may occur with  improper specimen collection/handling, submission of specimen other than nasopharyngeal swab, presence of viral mutation(s) within the areas targeted by this assay, and inadequate number of viral copies(<138 copies/mL). A negative result must be combined with clinical observations, patient history, and epidemiological information. The expected result is Negative.  Fact Sheet for Patients:  BloggerCourse.com  Fact Sheet for Healthcare Providers:  SeriousBroker.it  This test is no t yet approved or cleared by the [place] FDA and  has been authorized for detection and/or diagnosis of SARS-CoV-2 by FDA under an Emergency Use Authorization (EUA). This EUA will remain  in effect (meaning this test can be used) for the duration of the COVID-19 declaration under Section 564(b)(1) of the Act, 21 U.S.C.section 360bbb-3(b)(1), unless the authorization is terminated  or revoked sooner.       Influenza A by PCR POSITIVE (A) NEGATIVE Final   Influenza B by PCR NEGATIVE NEGATIVE Final  Comment: (NOTE) The Xpert Xpress SARS-CoV-2/FLU/RSV plus assay is intended as an aid in the  diagnosis of influenza from Nasopharyngeal swab specimens and should not be used as a sole basis for treatment. Nasal washings and aspirates are unacceptable for Xpert Xpress SARS-CoV-2/FLU/RSV testing.  Fact Sheet for Patients: BloggerCourse.com  Fact Sheet for Healthcare Providers: SeriousBroker.it  This test is not yet approved or cleared by the [place] FDA and has been authorized for detection and/or diagnosis of SARS-CoV-2 by FDA under an Emergency Use Authorization (EUA). This EUA will remain in effect (meaning this test can be used) for the duration of the COVID-19 declaration under Section 564(b)(1) of the Act, 21 U.S.C. section 360bbb-3(b)(1), unless the authorization is terminated or revoked.     Resp Syncytial Virus by PCR NEGATIVE NEGATIVE Final    Comment: (NOTE) Fact Sheet for Patients: BloggerCourse.com  Fact Sheet for Healthcare Providers: SeriousBroker.it  This test is not yet approved or cleared by the [place] FDA and has been authorized for detection and/or diagnosis of SARS-CoV-2 by FDA under an Emergency Use Authorization (EUA). This EUA will remain in effect (meaning this test can be used) for the duration of the COVID-19 declaration under Section 564(b)(1) of the Act, 21 U.S.C. section 360bbb-3(b)(1), unless the authorization is terminated or revoked.  Performed at [facility], [address], [place], [place] [address]     Procedures/Studies: DG Chest Portable 1 View Result Date: 05/12/2023 CLINICAL DATA:  Asthma.  Coughing. EXAM: PORTABLE CHEST 1 VIEW COMPARISON:  05/05/2023. FINDINGS: Bilateral lung fields are clear. Bilateral costophrenic angles are clear. Normal cardio-mediastinal silhouette. No acute osseous abnormalities. The soft tissues are within normal limits. IMPRESSION: No active disease. Electronically Signed    By: Jules Schick M.D.   On: 05/12/2023 11:58   DG Chest Port 1 View Result Date: 05/05/2023 CLINICAL DATA:  Shortness of breath. EXAM: PORTABLE CHEST 1 VIEW COMPARISON:  April 22, 2023 FINDINGS: The heart size and mediastinal contours are within normal limits. Both lungs are clear. The visualized skeletal structures are unremarkable. IMPRESSION: No active disease. Electronically Signed   By: Aram Candela M.D.   On: 05/05/2023 22:53   DG Chest 2 View Result Date: 04/22/2023 CLINICAL DATA:  Shortness of breath EXAM: CHEST - 2 VIEW COMPARISON:  12/01/2022 FINDINGS: The heart size and mediastinal contours are within normal limits. Both lungs are clear. The visualized skeletal structures are unremarkable. IMPRESSION: No active cardiopulmonary disease. Electronically Signed   By: Duanne Guess D.O.   On: 04/22/2023 16:14    Time coordinating discharge: 45 mins  SIGNED:  Carollee Herter, DO [facility] 05/13/23, 12:00 PM

## 2023-05-13 NOTE — Assessment & Plan Note (Signed)
Treated with po kcl.

## 2023-05-13 NOTE — Assessment & Plan Note (Signed)
Start ICS/LABA. Needs referral to PCP.

## 2023-05-13 NOTE — Progress Notes (Signed)
PROGRESS NOTE    Carol Hendrix  YNW:[id number] DOB: Jun 03, 2000 DOA: 05/12/2023 PCP: Default, Provider, MD  Subjective: Pt seen and examined. Talking loudly on her phone. On RA. No fevers.  Does not use alb inhaler regularly. Except for last 1-2 months. Does not take singulair on a regular basis. Not on any controller medications.   Hospital Course: HPI: Carol Hendrix is a 23 y.o. female with history of asthma who was recently admitted and discharged on 05/07/2023 for asthma exacerbation had come to the ER earlier in the morning yesterday with increasing difficulty breathing and wheezing at the time patient also was found to have flu positive was discharged home on tapering dose of steroids and Tamiflu but when patient went to pick her medication she got more short of breath and presents back to the ER.   ED Course: In the ER patient is found to be diffusely wheezing also complains of some pleuritic type of chest pain.  EKG shows sinus tachycardia with chest x-ray showing nothing acute.  Patient was placed on IV steroids hemolyze admitted for asthma exacerbation  Significant Events: Admitted 05/12/2023 for asthma exacerbation and influenza A   Significant Labs: WBC 13.4, HgB 13.4  Significant Imaging Studies:   Antibiotic Therapy: Anti-infectives (From admission, onward)    Start     Dose/Rate Route Frequency Ordered Stop   05/13/23 0000  oseltamivir (TAMIFLU) capsule 75 mg        75 mg Oral Every 12 hours 05/12/23 2347 05/17/23 1159       Procedures:   Consultants:     Assessment and Plan: * Asthma exacerbation On RA. Still dc to home on po prednisone 40 mg daily for 7 days. Will refill Rx for albuterol inh and nebs. Will need to start ICS/LABA  Influenza A Outside window for tamiflu to be effective. On RA. Continue supportive care at home.  Moderate persistent asthma Start ICS/LABA. Needs referral to PCP.  Hypokalemia Treated with po kcl.       DVT  prophylaxis: enoxaparin (LOVENOX) injection 40 mg Start: 05/13/23 1000     Code Status: Full Code Family Communication: no family at bedside. Pt is decisional. Disposition Plan: return home Reason for continuing need for hospitalization: stable for DC.  Objective: Vitals:   05/13/23 0745 05/13/23 0837 05/13/23 1014 05/13/23 1100  BP: 107/85   121/81  Pulse: 87   91  Resp: 16   (!) 21  Temp:   98.2 F (36.8 C)   TempSrc:   Oral   SpO2: 98%   99%  Weight:  74.8 kg    Height:  5\' 5"  (1.651 m)     No intake or output data in the 24 hours ending 05/13/23 1150 Filed Weights   05/13/23 0837  Weight: 74.8 kg    Examination:  Physical Exam Vitals and nursing note reviewed.  Constitutional:      General: She is not in acute distress.    Appearance: She is normal weight. She is not toxic-appearing or diaphoretic.  HENT:     Head: Normocephalic and atraumatic.     Nose: Nose normal.  Eyes:     General: No scleral icterus. Cardiovascular:     Rate and Rhythm: Normal rate and regular rhythm.     Pulses: Normal pulses.  Pulmonary:     Effort: Pulmonary effort is normal. No respiratory distress.     Comments: Scattered end expiratory wheezing. Good air movement. Abdominal:     General:  Abdomen is flat. Bowel sounds are normal.     Palpations: Abdomen is soft.  Musculoskeletal:     Right lower leg: No edema.     Left lower leg: No edema.  Skin:    General: Skin is warm and dry.     Capillary Refill: Capillary refill takes less than 2 seconds.  Neurological:     General: No focal deficit present.     Mental Status: She is alert and oriented to person, place, and time.     Data Reviewed: I have personally reviewed following labs and imaging studies  CBC: Recent Labs  Lab 05/12/23 0951 05/13/23 0012  WBC 11.1* 13.4*  NEUTROABS 9.2*  --   HGB 12.5 13.4  HCT 39.8 40.7  MCV 83.4 80.6  PLT 201 242   Basic Metabolic Panel: Recent Labs  Lab 05/12/23 0951  05/13/23 0012  NA 135  --   K 3.0*  --   CL 105  --   CO2 20*  --   GLUCOSE 139*  --   BUN 10  --   CREATININE 0.58 0.59  CALCIUM 8.0*  --    GFR: Estimated Creatinine Clearance: 110.7 mL/min (by C-G formula based on SCr of 0.59 mg/dL). Liver Function Tests: Recent Labs  Lab 05/12/23 0951  AST 15  ALT 14  ALKPHOS 55  BILITOT 0.6  PROT 6.6  ALBUMIN 3.9   Recent Results (from the past 240 hours)  Resp panel by RT-PCR (RSV, Flu A&B, Covid) Anterior Nasal Swab     Status: None   Collection Time: 05/05/23 10:42 PM   Specimen: Anterior Nasal Swab  Result Value Ref Range Status   SARS Coronavirus 2 by RT PCR NEGATIVE NEGATIVE Final   Influenza A by PCR NEGATIVE NEGATIVE Final   Influenza B by PCR NEGATIVE NEGATIVE Final    Comment: (NOTE) The Xpert Xpress SARS-CoV-2/FLU/RSV plus assay is intended as an aid in the diagnosis of influenza from Nasopharyngeal swab specimens and should not be used as a sole basis for treatment. Nasal washings and aspirates are unacceptable for Xpert Xpress SARS-CoV-2/FLU/RSV testing.  Fact Sheet for Patients: BloggerCourse.com  Fact Sheet for Healthcare Providers: SeriousBroker.it  This test is not yet approved or cleared by the [place] FDA and has been authorized for detection and/or diagnosis of SARS-CoV-2 by FDA under an Emergency Use Authorization (EUA). This EUA will remain in effect (meaning this test can be used) for the duration of the COVID-19 declaration under Section 564(b)(1) of the Act, 21 U.S.C. section 360bbb-3(b)(1), unless the authorization is terminated or revoked.     Resp Syncytial Virus by PCR NEGATIVE NEGATIVE Final    Comment: (NOTE) Fact Sheet for Patients: BloggerCourse.com  Fact Sheet for Healthcare Providers: SeriousBroker.it  This test is not yet approved or cleared by the [place] FDA and has been  authorized for detection and/or diagnosis of SARS-CoV-2 by FDA under an Emergency Use Authorization (EUA). This EUA will remain in effect (meaning this test can be used) for the duration of the COVID-19 declaration under Section 564(b)(1) of the Act, 21 U.S.C. section 360bbb-3(b)(1), unless the authorization is terminated or revoked.  Performed at [facility], [address], [place], [place] [address]   Resp panel by RT-PCR (RSV, Flu A&B, Covid) Anterior Nasal Swab     Status: Abnormal   Collection Time: 05/12/23  9:51 AM   Specimen: Anterior Nasal Swab  Result Value Ref Range Status   SARS Coronavirus 2  by RT PCR NEGATIVE NEGATIVE Final    Comment: (NOTE) SARS-CoV-2 target nucleic acids are NOT DETECTED.  The SARS-CoV-2 RNA is generally detectable in upper respiratory specimens during the acute phase of infection. The lowest concentration of SARS-CoV-2 viral copies this assay can detect is 138 copies/mL. A negative result does not preclude SARS-Cov-2 infection and should not be used as the sole basis for treatment or other patient management decisions. A negative result may occur with  improper specimen collection/handling, submission of specimen other than nasopharyngeal swab, presence of viral mutation(s) within the areas targeted by this assay, and inadequate number of viral copies(<138 copies/mL). A negative result must be combined with clinical observations, patient history, and epidemiological information. The expected result is Negative.  Fact Sheet for Patients:  BloggerCourse.com  Fact Sheet for Healthcare Providers:  SeriousBroker.it  This test is no t yet approved or cleared by the [place] FDA and  has been authorized for detection and/or diagnosis of SARS-CoV-2 by FDA under an Emergency Use Authorization (EUA). This EUA will remain  in effect (meaning this test can be used) for the duration of  the COVID-19 declaration under Section 564(b)(1) of the Act, 21 U.S.C.section 360bbb-3(b)(1), unless the authorization is terminated  or revoked sooner.       Influenza A by PCR POSITIVE (A) NEGATIVE Final   Influenza B by PCR NEGATIVE NEGATIVE Final    Comment: (NOTE) The Xpert Xpress SARS-CoV-2/FLU/RSV plus assay is intended as an aid in the diagnosis of influenza from Nasopharyngeal swab specimens and should not be used as a sole basis for treatment. Nasal washings and aspirates are unacceptable for Xpert Xpress SARS-CoV-2/FLU/RSV testing.  Fact Sheet for Patients: BloggerCourse.com  Fact Sheet for Healthcare Providers: SeriousBroker.it  This test is not yet approved or cleared by the [place] FDA and has been authorized for detection and/or diagnosis of SARS-CoV-2 by FDA under an Emergency Use Authorization (EUA). This EUA will remain in effect (meaning this test can be used) for the duration of the COVID-19 declaration under Section 564(b)(1) of the Act, 21 U.S.C. section 360bbb-3(b)(1), unless the authorization is terminated or revoked.     Resp Syncytial Virus by PCR NEGATIVE NEGATIVE Final    Comment: (NOTE) Fact Sheet for Patients: BloggerCourse.com  Fact Sheet for Healthcare Providers: SeriousBroker.it  This test is not yet approved or cleared by the [place] FDA and has been authorized for detection and/or diagnosis of SARS-CoV-2 by FDA under an Emergency Use Authorization (EUA). This EUA will remain in effect (meaning this test can be used) for the duration of the COVID-19 declaration under Section 564(b)(1) of the Act, 21 U.S.C. section 360bbb-3(b)(1), unless the authorization is terminated or revoked.  Performed at [facility], [address], [place], [place] [address]      Radiology Studies: DG Chest Portable 1  View Result Date: 05/12/2023 CLINICAL DATA:  Asthma.  Coughing. EXAM: PORTABLE CHEST 1 VIEW COMPARISON:  05/05/2023. FINDINGS: Bilateral lung fields are clear. Bilateral costophrenic angles are clear. Normal cardio-mediastinal silhouette. No acute osseous abnormalities. The soft tissues are within normal limits. IMPRESSION: No active disease. Electronically Signed   By: Jules Schick M.D.   On: 05/12/2023 11:58    Scheduled Meds:  enoxaparin (LOVENOX) injection  40 mg Subcutaneous Q24H   ipratropium-albuterol  3 mL Nebulization QID   medroxyPROGESTERone  150 mg Intramuscular Q90 days   montelukast  10 mg Oral QHS   oseltamivir  75 mg Oral Q12H   predniSONE  40 mg Oral STAT   Continuous Infusions:   LOS: 0 days   Time spent: 40 minutes  Carollee Herter, DO  [facility]  05/13/2023, 11:50 AM

## 2023-05-13 NOTE — Assessment & Plan Note (Signed)
Outside window for tamiflu to be effective. On RA. Continue supportive care at home.

## 2023-05-13 NOTE — Subjective & Objective (Signed)
Pt seen and examined. Talking loudly on her phone. On RA. No fevers.  Does not use alb inhaler regularly. Except for last 1-2 months. Does not take singulair on a regular basis. Not on any controller medications.

## 2023-05-13 NOTE — Assessment & Plan Note (Addendum)
On RA. Still dc to home on po prednisone 40 mg daily for 7 days. Will refill Rx for albuterol inh and nebs. Will need to start ICS/LABA. TOC consult for new home nebulizer machine. Pt states her old machine isn't working properly.

## 2023-05-13 NOTE — Hospital Course (Signed)
HPI: Carol Hendrix is a 23 y.o. female with history of asthma who was recently admitted and discharged on 05/07/2023 for asthma exacerbation had come to the ER earlier in the morning yesterday with increasing difficulty breathing and wheezing at the time patient also was found to have flu positive was discharged home on tapering dose of steroids and Tamiflu but when patient went to pick her medication she got more short of breath and presents back to the ER.   ED Course: In the ER patient is found to be diffusely wheezing also complains of some pleuritic type of chest pain.  EKG shows sinus tachycardia with chest x-ray showing nothing acute.  Patient was placed on IV steroids hemolyze admitted for asthma exacerbation  Significant Events: Admitted 05/12/2023 for asthma exacerbation and influenza A   Significant Labs: WBC 13.4, HgB 13.4  Significant Imaging Studies:   Antibiotic Therapy: Anti-infectives (From admission, onward)    Start     Dose/Rate Route Frequency Ordered Stop   05/13/23 0000  oseltamivir (TAMIFLU) capsule 75 mg        75 mg Oral Every 12 hours 05/12/23 2347 05/17/23 1159       Procedures:   Consultants:

## 2023-05-27 DIAGNOSIS — Z419 Encounter for procedure for purposes other than remedying health state, unspecified: Secondary | ICD-10-CM | POA: Diagnosis not present

## 2023-06-01 ENCOUNTER — Ambulatory Visit: Payer: Medicaid Other | Admitting: Allergy

## 2023-06-03 ENCOUNTER — Encounter (HOSPITAL_COMMUNITY): Payer: Self-pay | Admitting: Emergency Medicine

## 2023-06-03 ENCOUNTER — Emergency Department (HOSPITAL_COMMUNITY)
Admission: EM | Admit: 2023-06-03 | Discharge: 2023-06-03 | Disposition: A | Discharge status: Home / Self Care | Payer: Medicaid Other | Attending: Emergency Medicine | Admitting: Emergency Medicine

## 2023-06-03 ENCOUNTER — Other Ambulatory Visit: Payer: Self-pay

## 2023-06-03 DIAGNOSIS — F10129 Alcohol abuse with intoxication, unspecified: Secondary | ICD-10-CM | POA: Diagnosis not present

## 2023-06-03 DIAGNOSIS — Y908 Blood alcohol level of 240 mg/100 ml or more: Secondary | ICD-10-CM | POA: Insufficient documentation

## 2023-06-03 DIAGNOSIS — F10920 Alcohol use, unspecified with intoxication, uncomplicated: Secondary | ICD-10-CM

## 2023-06-03 LAB — CBC
HCT: 39.4 % (ref 36.0–46.0)
Hemoglobin: 12.1 g/dL (ref 12.0–15.0)
MCH: 25.7 pg — ABNORMAL LOW (ref 26.0–34.0)
MCHC: 30.7 g/dL (ref 30.0–36.0)
MCV: 83.7 fL (ref 80.0–100.0)
Platelets: 227 10*3/uL (ref 150–400)
RBC: 4.71 MIL/uL (ref 3.87–5.11)
RDW: 16.1 % — ABNORMAL HIGH (ref 11.5–15.5)
WBC: 7.2 10*3/uL (ref 4.0–10.5)
nRBC: 0 % (ref 0.0–0.2)

## 2023-06-03 LAB — COMPREHENSIVE METABOLIC PANEL
ALT: 10 U/L (ref 0–44)
AST: 15 U/L (ref 15–41)
Albumin: 4.1 g/dL (ref 3.5–5.0)
Alkaline Phosphatase: 61 U/L (ref 38–126)
Anion gap: 10 (ref 5–15)
BUN: 8 mg/dL (ref 6–20)
CO2: 25 mmol/L (ref 22–32)
Calcium: 8.9 mg/dL (ref 8.9–10.3)
Chloride: 104 mmol/L (ref 98–111)
Creatinine, Ser: 0.68 mg/dL (ref 0.44–1.00)
GFR, Estimated: >60 mL/min (ref >60)
Glucose, Bld: 104 mg/dL — ABNORMAL HIGH (ref 70–99)
Potassium: 3.6 mmol/L (ref 3.5–5.1)
Sodium: 139 mmol/L (ref 135–145)
Total Bilirubin: 0.6 mg/dL (ref 0.0–1.2)
Total Protein: 7.7 g/dL (ref 6.5–8.1)

## 2023-06-03 LAB — RAPID URINE DRUG SCREEN, HOSP PERFORMED
Amphetamines: NOT DETECTED
Barbiturates: NOT DETECTED
Benzodiazepines: NOT DETECTED
Cocaine: NOT DETECTED
Opiates: NOT DETECTED
Tetrahydrocannabinol: POSITIVE — AB

## 2023-06-03 LAB — CBG MONITORING, ED: Glucose-Capillary: 110 mg/dL — ABNORMAL HIGH (ref 70–99)

## 2023-06-03 LAB — ETHANOL: Alcohol, Ethyl (B): 245 mg/dL — ABNORMAL HIGH (ref <10)

## 2023-06-03 LAB — HCG, SERUM, QUALITATIVE: Preg, Serum: NEGATIVE

## 2023-06-03 MED ORDER — SODIUM CHLORIDE 0.9 % IV BOLUS
500.0000 mL | Freq: Once | INTRAVENOUS | Status: AC
  Administered 2023-06-03: 500 mL via INTRAVENOUS

## 2023-06-03 MED ORDER — NALOXONE HCL 2 MG/2ML IJ SOSY
2.0000 mg | PREFILLED_SYRINGE | Freq: Once | INTRAMUSCULAR | Status: AC
  Administered 2023-06-03: 2 mg via INTRAVENOUS
  Filled 2023-06-03: qty 2

## 2023-06-03 MED ORDER — ONDANSETRON HCL 4 MG/2ML IJ SOLN
4.0000 mg | Freq: Once | INTRAMUSCULAR | Status: AC
  Administered 2023-06-03: 4 mg via INTRAVENOUS
  Filled 2023-06-03: qty 2

## 2023-06-03 NOTE — ED Provider Notes (Signed)
 [place] EMERGENCY DEPARTMENT AT [facility] Provider Note   CSN: [id number] Arrival date & time: 06/03/23  0206     History  Chief Complaint  Patient presents with   Alcohol Intoxication    Carol Hendrix is a 23 y.o. female.  The history is provided by the EMS personnel. The history is limited by the condition of the patient.  Alcohol Intoxication This is a new problem. The problem occurs constantly. The problem has not changed since onset.Nothing aggravates the symptoms. Nothing relieves the symptoms. She has tried nothing for the symptoms. The treatment provided no relief.       Home Medications Prior to Admission medications   Not on File      Allergies    Patient has no allergy information on record.    Review of Systems   Review of Systems  Unable to perform ROS: Acuity of condition  Constitutional:  Negative for fever.  Eyes:  Negative for redness.  Gastrointestinal:  Positive for vomiting.    Physical Exam Updated Vital Signs BP 120/82 (BP Location: Left Arm)   Pulse 73   Temp 98.7 F (37.1 C) (Axillary)   Resp 16   Wt 79.4 kg   LMP  (LMP Unknown)   SpO2 100%  Physical Exam Vitals and nursing note reviewed. Exam conducted with a chaperone present.  Constitutional:      General: She is not in acute distress.    Appearance: She is well-developed.  HENT:     Head: Normocephalic and atraumatic.     Nose: Nose normal.     Mouth/Throat:     Mouth: Mucous membranes are moist.  Eyes:     Pupils: Pupils are equal, round, and reactive to light.  Cardiovascular:     Rate and Rhythm: Normal rate and regular rhythm.     Pulses: Normal pulses.     Heart sounds: Normal heart sounds.  Pulmonary:     Effort: Pulmonary effort is normal. No respiratory distress.     Breath sounds: Normal breath sounds.  Abdominal:     General: Bowel sounds are normal. There is no distension.     Palpations: Abdomen is soft.     Tenderness: There is no abdominal  tenderness. There is no guarding or rebound.  Musculoskeletal:        General: Normal range of motion.     Cervical back: Neck supple.  Skin:    General: Skin is dry.     Capillary Refill: Capillary refill takes less than 2 seconds.     Findings: No erythema or rash.  Neurological:     Deep Tendon Reflexes: Reflexes normal.     ED Results / Procedures / Treatments   Labs (all labs ordered are listed, but only abnormal results are displayed) Results for orders placed or performed during the hospital encounter of 06/03/23  Comprehensive metabolic panel   Collection Time: 06/03/23  2:21 AM  Result Value Ref Range   Sodium 139 135 - 145 mmol/L   Potassium 3.6 3.5 - 5.1 mmol/L   Chloride 104 98 - 111 mmol/L   CO2 25 22 - 32 mmol/L   Glucose, Bld 104 (H) 70 - 99 mg/dL   BUN 8 6 - 20 mg/dL   Creatinine, Ser 9.31 0.44 - 1.00 mg/dL   Calcium  8.9 8.9 - 10.3 mg/dL   Total Protein 7.7 6.5 - 8.1 g/dL   Albumin 4.1 3.5 - 5.0 g/dL   AST 15 15 -  41 U/L   ALT 10 0 - 44 U/L   Alkaline Phosphatase 61 38 - 126 U/L   Total Bilirubin 0.6 0.0 - 1.2 mg/dL   GFR, Estimated >39 >39 mL/min   Anion gap 10 5 - 15  Ethanol   Collection Time: 06/03/23  2:21 AM  Result Value Ref Range   Alcohol, Ethyl (B) 245 (H) <10 mg/dL  cbc   Collection Time: 06/03/23  2:21 AM  Result Value Ref Range   WBC 7.2 4.0 - 10.5 K/uL   RBC 4.71 3.87 - 5.11 MIL/uL   Hemoglobin 12.1 12.0 - 15.0 g/dL   HCT 60.5 63.9 - 53.9 %   MCV 83.7 80.0 - 100.0 fL   MCH 25.7 (L) 26.0 - 34.0 pg   MCHC 30.7 30.0 - 36.0 g/dL   RDW 83.8 (H) 88.4 - 84.4 %   Platelets 227 150 - 400 K/uL   nRBC 0.0 0.0 - 0.2 %  hCG, serum, qualitative   Collection Time: 06/03/23  2:21 AM  Result Value Ref Range   Preg, Serum NEGATIVE NEGATIVE  CBG monitoring, ED   Collection Time: 06/03/23  2:53 AM  Result Value Ref Range   Glucose-Capillary 110 (H) 70 - 99 mg/dL   No results found.  Radiology No results found.  Procedures Procedures     Medications Ordered in ED Medications  ondansetron  (ZOFRAN ) injection 4 mg (4 mg Intravenous Given 06/03/23 0258)  sodium chloride  0.9 % bolus 500 mL (500 mLs Intravenous New Bag/Given 06/03/23 0258)  naloxone  (NARCAN ) injection 2 mg (2 mg Intravenous Given 06/03/23 0258)    ED Course/ Medical Decision Making/ A&P                                 Medical Decision Making Found in car intoxicated with emesis.    Amount and/or Complexity of Data Reviewed Independent Historian: EMS    Details: See above  External Data Reviewed: notes.    Details: Attempted none present  Labs: ordered.    Details: Pregnancy is negative, white count normal 7.2, normal hemoglobin 12.1, normal platelets.  ETHOH 245 elevated.  Normal sodium 139, normal potassium 3.6, normal creatinine 0.68   Risk Prescription drug management. Risk Details: Sleeping in the chair.  Upright.  Given anti-emetics to prevent vomiting.  Arouses to verbal and tactile stimuli.     Final Clinical Impression(s) / ED Diagnoses Final diagnoses:  None   Signed out Dr. Dean pending reassessment  Rx / DC Orders ED Discharge Orders     None         Rane Dumm, MD 06/03/23 9593

## 2023-06-03 NOTE — ED Triage Notes (Signed)
 Pt in via [facility], found passed out covered in emesis in her car with strong ETOH odor present. Pt alert to painful stimuli, is able to state name. VS en route: 99%RA - 16RR - 75HR

## 2023-06-03 NOTE — ED Provider Notes (Signed)
 Pt signed out by Dr. Nettie pending improvement back to normal ms.  She is asleep now, but was able to get up and transfer to a bed and has been able to walk to the bathroom.  Pt is now awake and alert and wants to go home.  She is stable for d/c.  Return if worse.    Dean Clarity, MD 06/03/23 1027

## 2023-06-03 NOTE — ED Notes (Signed)
 Pt ambulated to bathroom with minimal assistance. Patient was able to provide a urine sample. Patient states that she is able to call her boyfriend once her phone has charge. Patient's phone is located at the secretary's desk, on the charger. Pt resting comfortably.

## 2023-06-03 NOTE — ED Notes (Signed)
 Patient awake and alert now she is agitated and is asking to be discharged. She states she does not remember what happened. MD Scarlette Currier made aware.

## 2023-06-05 ENCOUNTER — Encounter (HOSPITAL_COMMUNITY): Payer: Self-pay

## 2023-06-13 ENCOUNTER — Ambulatory Visit (HOSPITAL_COMMUNITY): Payer: Medicaid Other

## 2023-06-23 ENCOUNTER — Ambulatory Visit
Admission: RE | Admit: 2023-06-23 | Discharge: 2023-06-23 | Disposition: A | Discharge status: Home / Self Care | Payer: Medicaid Other | Source: Ambulatory Visit | Attending: Internal Medicine | Admitting: Internal Medicine

## 2023-06-23 ENCOUNTER — Other Ambulatory Visit: Payer: Self-pay

## 2023-06-23 VITALS — BP 114/71 | HR 76 | Temp 98.3°F | Resp 18

## 2023-06-23 DIAGNOSIS — J029 Acute pharyngitis, unspecified: Secondary | ICD-10-CM | POA: Insufficient documentation

## 2023-06-23 LAB — POCT RAPID STREP A (OFFICE): Rapid Strep A Screen: NEGATIVE

## 2023-06-23 MED ORDER — IBUPROFEN 800 MG PO TABS: 800.0000 mg | ORAL_TABLET | Freq: Three times a day (TID) | ORAL | 0 refills | Status: AC | PRN

## 2023-06-23 MED ORDER — IBUPROFEN 800 MG PO TABS
800.0000 mg | ORAL_TABLET | Freq: Once | ORAL | Status: AC
  Administered 2023-06-23: 800 mg via ORAL

## 2023-06-23 NOTE — ED Triage Notes (Signed)
 Patient presents with c/o sore throat x 1 day. Denies any other symptoms at this time.

## 2023-06-23 NOTE — Discharge Instructions (Signed)
 Strep test in the clinic is negative, staff will call you if the throat culture is positive for bacteria in the next 2-3 days and call in treatment if necessary based on result.   For now, we will treat this as a viral infection with the following: - Over the counter medicines as needed for pain and swelling of the throat (Ibuprofen 600mg  and/or Tylenol 1,000mg  every 6 hours as needed) - 1 tablespoon of honey in warm water and/or salt water gargles every 3-4 hours  Seek medical care if you develop changes to your voice, inability to swallow, drooling, or any new symptoms. If symptoms are severe, please go to the ER. I hope you feel better!

## 2023-06-23 NOTE — ED Provider Notes (Signed)
 [facility]    CSN: [id number] Arrival date & time: 06/23/23  1216      History   Chief Complaint Chief Complaint  Patient presents with   Sore Throat    Entered by patient    HPI Carol Hendrix is a 23 y.o. female.   Carol Hendrix is a 23 y.o. female presenting for chief complaint of Sore Throat that started yesterday. Throat pain is 6/10 and worsened by swallowing. Denies associated cough, nasal congestion, dizziness, fever, chills, rash, body aches, ear pain, headache, and CP/SOB. No recent sick contacts with similar symptoms. Denies changes in voice sounds and voice hoarseness. She has tried taking cough drops to help with symptoms with some relief. She has not taken any tylenol/motrin for pain.    Sore Throat    Past Medical History:  Diagnosis Date   Anemia    Asthma    Delivery of twins, both live 12/29/2021   Preterm premature rupture of membranes (PPROM) delivered, current hospitalization 12/27/2021   Preterm premature rupture of membranes (PPROM) with onset of labor after 24 hours of rupture in second trimester, antepartum 11/22/2021   Completed antibiotics and BMZ.  Pt left hospital AMA at 29 weeks due to childcare issues    Patient Active Problem List   Diagnosis Date Noted   Influenza A 05/12/2023   Hypokalemia 05/12/2023   Asthma exacerbation 05/06/2023   Postpartum care following vaginal delivery 03/22/2022   Contraception management 03/22/2022   Syncope 08/11/2021   Moderate persistent asthma 03/25/2018   Tobacco use disorder 03/25/2018    Past Surgical History:  Procedure Laterality Date   NO PAST SURGERIES      OB History     Gravida  3   Para  3   Term  2   Preterm  1   AB  0   Living  4      SAB  0   IAB  0   Ectopic  0   Multiple  1   Live Births  4            Home Medications    Prior to Admission medications   Medication Sig Start Date End Date Taking? Authorizing Provider  acetaminophen  (TYLENOL) 500 MG tablet Take 1,000 mg by mouth every 6 (six) hours as needed for mild pain (pain score 1-3) or headache.    [provider]  albuterol (PROVENTIL) (2.5 MG/3ML) 0.083% nebulizer solution Inhale 3 mLs (2.5 mg total) into the lungs every 4 (four) hours as needed (SOB wheezing). 05/13/23 06/12/23  Carollee Herter, DO  albuterol (VENTOLIN HFA) 108 (90 Base) MCG/ACT inhaler Inhale 2 puffs into the lungs every 6 (six) hours as needed for wheezing or shortness of breath. 05/13/23   Carollee Herter, DO  budesonide-formoterol [facility]) 160-4.5 MCG/ACT inhaler Inhale 2 puffs into the lungs in the morning and at bedtime. 05/13/23   Carollee Herter, DO  ibuprofen (ADVIL) 800 MG tablet Take 1 tablet (800 mg total) by mouth every 8 (eight) hours as needed for moderate pain (pain score 4-6), headache or fever. 06/23/23   Carlisle Beers, FNP  montelukast (SINGULAIR) 10 MG tablet Take 1 tablet (10 mg total) by mouth at bedtime. 05/13/23 08/11/23  Carollee Herter, DO  ipratropium (ATROVENT) 0.03 % nasal spray Place 2 sprays into both nostrils 2 (two) times daily. Patient not taking: Reported on 07/31/2018 06/21/18 11/13/18  Bing Neighbors, NP    Family History Family History  Problem Relation Age of Onset   Healthy Mother    Healthy Father    Pulmonary fibrosis Maternal Grandmother     Social History Social History   Tobacco Use   Smoking status: Never   Smokeless tobacco: Never   Tobacco comments:    rolls marijuana in with tobacco  Vaping Use   Vaping status: Former   Substances: Nicotine  Substance Use Topics   Alcohol use: Yes    Comment: occ   Drug use: Yes    Types: Marijuana    Comment: last smoked marijuana one year ago as of 07/16/2021     Allergies   Patient has no known allergies.   Review of Systems Review of Systems Per HPI  Physical Exam Triage Vital Signs ED Triage Vitals  Encounter Vitals Group     BP 06/23/23 1225 114/71     Systolic BP Percentile --       Diastolic BP Percentile --      Pulse Rate 06/23/23 1225 76     Resp 06/23/23 1225 18     Temp 06/23/23 1225 98.3 F (36.8 C)     Temp Source 06/23/23 1225 Oral     SpO2 06/23/23 1225 99 %     Weight --      Height --      Head Circumference --      Peak Flow --      Pain Score 06/23/23 1223 6     Pain Loc --      Pain Education --      Exclude from Growth Chart --    No data found.  Updated Vital Signs BP 114/71 (BP Location: Right Arm)   Pulse 76   Temp 98.3 F (36.8 C) (Oral)   Resp 18   LMP 06/21/2023   SpO2 99%   Breastfeeding No   Visual Acuity Right Eye Distance:   Left Eye Distance:   Bilateral Distance:    Right Eye Near:   Left Eye Near:    Bilateral Near:     Physical Exam Vitals and nursing note reviewed.  Constitutional:      Appearance: She is not ill-appearing or toxic-appearing.  HENT:     Head: Normocephalic and atraumatic.     Right Ear: Hearing, tympanic membrane, ear canal and external ear normal.     Left Ear: Hearing, tympanic membrane, ear canal and external ear normal.     Nose: Nose normal.     Mouth/Throat:     Lips: Pink.     Mouth: Mucous membranes are moist. No injury or oral lesions.     Dentition: Normal dentition.     Tongue: No lesions.     Pharynx: Oropharynx is clear. Uvula midline. Posterior oropharyngeal erythema present. No pharyngeal swelling, oropharyngeal exudate, uvula swelling or postnasal drip.     Tonsils: No tonsillar exudate or tonsillar abscesses. 2+ on the right. 2+ on the left.  Eyes:     General: Lids are normal. Vision grossly intact. Gaze aligned appropriately.     Extraocular Movements: Extraocular movements intact.     Conjunctiva/sclera: Conjunctivae normal.  Neck:     Trachea: Trachea and phonation normal.  Cardiovascular:     Rate and Rhythm: Normal rate and regular rhythm.     Heart sounds: Normal heart sounds, S1 normal and S2 normal.  Pulmonary:     Effort: Pulmonary effort is normal. No  respiratory distress.     Breath sounds: Normal breath  sounds and air entry. No wheezing, rhonchi or rales.  Chest:     Chest wall: No tenderness.  Musculoskeletal:     Cervical back: Neck supple.  Lymphadenopathy:     Cervical: No cervical adenopathy.  Skin:    General: Skin is warm and dry.     Capillary Refill: Capillary refill takes less than 2 seconds.     Findings: No rash.  Neurological:     General: No focal deficit present.     Mental Status: She is alert and oriented to person, place, and time. Mental status is at baseline.     Cranial Nerves: No dysarthria or facial asymmetry.  Psychiatric:        Mood and Affect: Mood normal.        Speech: Speech normal.        Behavior: Behavior normal.        Thought Content: Thought content normal.        Judgment: Judgment normal.      UC Treatments / Results  Labs (all labs ordered are listed, but only abnormal results are displayed) Labs Reviewed  CULTURE, GROUP A STREP [facility])  POCT RAPID STREP A (OFFICE)    EKG   Radiology No results found.  Procedures Procedures (including critical care time)  Medications Ordered in UC Medications  ibuprofen (ADVIL) tablet 800 mg (800 mg Oral Given 06/23/23 1305)    Initial Impression / Assessment and Plan / UC Course  I have reviewed the triage vital signs and the nursing notes.  Pertinent labs & imaging results that were available during my care of the patient were reviewed by me and considered in my medical decision making (see chart for details).   1. Viral pharyngitis Evaluation suggests viral pharyngitis etiology.   Group A strep POC testing negative, throat culture pending, will treat based on throat culture results for bacterial pharyngitis if indicated.  Low suspicion for mononucleosis, epiglottitis, peritonsillar abscess, etc.  HEENT exam stable without red flag signs. Will manage this conservatively with supportive care as outlined in AVS.  Salt water gargles  as needed, warm water with honey.   Counseled patient on potential for adverse effects with medications prescribed/recommended today, strict ER and return-to-clinic precautions discussed, patient verbalized understanding.    Final Clinical Impressions(s) / UC Diagnoses   Final diagnoses:  Viral pharyngitis     Discharge Instructions      Strep test in the clinic is negative, staff will call you if the throat culture is positive for bacteria in the next 2-3 days and call in treatment if necessary based on result.   For now, we will treat this as a viral infection with the following: - Over the counter medicines as needed for pain and swelling of the throat (Ibuprofen 600mg  and/or Tylenol 1,000mg  every 6 hours as needed) - 1 tablespoon of honey in warm water and/or salt water gargles every 3-4 hours  Seek medical care if you develop changes to your voice, inability to swallow, drooling, or any new symptoms. If symptoms are severe, please go to the ER. I hope you feel better!       ED Prescriptions     Medication Sig Dispense Auth. Provider   ibuprofen (ADVIL) 800 MG tablet Take 1 tablet (800 mg total) by mouth every 8 (eight) hours as needed for moderate pain (pain score 4-6), headache or fever. 20 tablet Carlisle Beers, FNP      PDMP not reviewed this encounter.  Carlisle Beers, [place] 06/23/23 1308

## 2023-06-24 DIAGNOSIS — Z419 Encounter for procedure for purposes other than remedying health state, unspecified: Secondary | ICD-10-CM | POA: Diagnosis not present

## 2023-06-26 LAB — CULTURE, GROUP A STREP (THRC)

## 2023-07-13 ENCOUNTER — Ambulatory Visit
Admission: RE | Admit: 2023-07-13 | Discharge: 2023-07-13 | Disposition: A | Discharge status: Home / Self Care | Source: Ambulatory Visit | Attending: Physician Assistant | Admitting: Physician Assistant

## 2023-07-13 ENCOUNTER — Ambulatory Visit

## 2023-07-13 VITALS — BP 107/72 | HR 66 | Temp 97.3°F | Resp 14

## 2023-07-13 DIAGNOSIS — N898 Other specified noninflammatory disorders of vagina: Secondary | ICD-10-CM | POA: Insufficient documentation

## 2023-07-13 NOTE — ED Triage Notes (Signed)
 Pt reports thick,  white vaginal discharge x3 days. Pt states this is similar to when she had BV previously. Would like to complete swab with STIs included.

## 2023-07-13 NOTE — ED Provider Notes (Signed)
 [facility]    CSN: [id number] Arrival date & time: 07/13/23  1645      History   Chief Complaint Chief Complaint  Patient presents with   Vaginal Discharge    HPI Carol [facility]rix is a 23 y.o. female.   Patient here today for evaluation of thick vaginal discharge.  She reports that symptoms started 3 days ago.  She notes that similar to when she has had BV in the past.  She would like swab for screening to include STDs.  She denies any known STD exposures.  The history is provided by the patient.  Vaginal Discharge Associated symptoms: no abdominal pain, no fever, no nausea and no vomiting     Past Medical History:  Diagnosis Date   Anemia    Asthma    Delivery of twins, both live 12/29/2021   Preterm premature rupture of membranes (PPROM) delivered, current hospitalization 12/27/2021   Preterm premature rupture of membranes (PPROM) with onset of labor after 24 hours of rupture in second trimester, antepartum 11/22/2021   Completed antibiotics and BMZ.  Pt left hospital AMA at 29 weeks due to childcare issues    Patient Active Problem List   Diagnosis Date Noted   Influenza A 05/12/2023   Hypokalemia 05/12/2023   Asthma exacerbation 05/06/2023   Postpartum care following vaginal delivery 03/22/2022   Contraception management 03/22/2022   Syncope 08/11/2021   Moderate persistent asthma 03/25/2018   Tobacco use disorder 03/25/2018    Past Surgical History:  Procedure Laterality Date   NO PAST SURGERIES      OB History     Gravida  3   Para  3   Term  2   Preterm  1   AB  0   Living  4      SAB  0   IAB  0   Ectopic  0   Multiple  1   Live Births  4            Home Medications    Prior to Admission medications   Medication Sig Start Date End Date Taking? Authorizing Provider  albuterol (VENTOLIN HFA) 108 (90 Base) MCG/ACT inhaler Inhale 2 puffs into the lungs every 6 (six) hours as needed for wheezing or shortness of  breath. 05/13/23  Yes Carollee Herter, DO  ibuprofen (ADVIL) 800 MG tablet Take 1 tablet (800 mg total) by mouth every 8 (eight) hours as needed for moderate pain (pain score 4-6), headache or fever. 06/23/23  Yes Carlisle Beers, FNP  montelukast (SINGULAIR) 10 MG tablet Take 1 tablet (10 mg total) by mouth at bedtime. 05/13/23 08/11/23 Yes Carollee Herter, DO  acetaminophen (TYLENOL) 500 MG tablet Take 1,000 mg by mouth every 6 (six) hours as needed for mild pain (pain score 1-3) or headache.    [provider]  albuterol (PROVENTIL) (2.5 MG/3ML) 0.083% nebulizer solution Inhale 3 mLs (2.5 mg total) into the lungs every 4 (four) hours as needed (SOB wheezing). 05/13/23 06/12/23  Carollee Herter, DO  amoxicillin (AMOXIL) 500 MG tablet Take 500 mg by mouth 3 (three) times daily. Patient not taking: Reported on 07/13/2023 03/02/23   [provider]  budesonide-formoterol (SYMBICORT) 160-4.5 MCG/ACT inhaler Inhale 2 puffs into the lungs in the morning and at bedtime. 05/13/23   Carollee Herter, DO  chlorhexidine (PERIDEX) 0.12 % solution SWISH FOR 1 MINUTE AND SPIT TWICE DAILY FOR 10 DAYS Patient not taking: Reported on 07/13/2023 03/02/23   [provider]  HYDROcodone-acetaminophen (NORCO/VICODIN) 5-325 MG tablet Take 1 tablet by mouth every 6 (six) hours as needed. Patient not taking: Reported on 07/13/2023 03/02/23   [provider]  oseltamivir (TAMIFLU) 75 MG capsule Take by mouth. Patient not taking: Reported on 07/13/2023 05/13/23   [provider]  ipratropium (ATROVENT) 0.03 % nasal spray Place 2 sprays into both nostrils 2 (two) times daily. Patient not taking: Reported on 07/31/2018 06/21/18 11/13/18  Bing Neighbors, NP    Family History Family History  Problem Relation Age of Onset   Healthy Mother    Healthy Father    Pulmonary fibrosis Maternal Grandmother     Social History Social History   Tobacco Use   Smoking status: Never   Smokeless tobacco: Never    Tobacco comments:    rolls marijuana in with tobacco  Vaping Use   Vaping status: Former   Substances: Nicotine  Substance Use Topics   Alcohol use: Yes    Comment: occ   Drug use: Yes    Types: Marijuana    Comment: last smoked marijuana one year ago as of 07/16/2021     Allergies   Patient has no known allergies.   Review of Systems Review of Systems  Constitutional:  Negative for chills and fever.  Eyes:  Negative for discharge and redness.  Respiratory:  Negative for shortness of breath.   Gastrointestinal:  Negative for abdominal pain, nausea and vomiting.  Genitourinary:  Positive for vaginal discharge. Negative for genital sores.     Physical Exam Triage Vital Signs ED Triage Vitals  Encounter Vitals Group     BP 07/13/23 1715 107/72     Systolic BP Percentile --      Diastolic BP Percentile --      Pulse Rate 07/13/23 1715 66     Resp 07/13/23 1715 14     Temp 07/13/23 1715 (!) 97.3 F (36.3 C)     Temp Source 07/13/23 1715 Oral     SpO2 07/13/23 1715 97 %     Weight --      Height --      Head Circumference --      Peak Flow --      Pain Score 07/13/23 1716 0     Pain Loc --      Pain Education --      Exclude from Growth Chart --    No data found.  Updated Vital Signs BP 107/72 (BP Location: Left Arm)   Pulse 66   Temp (!) 97.3 F (36.3 C) (Oral)   Resp 14   LMP 07/13/2023 (Exact Date)   SpO2 97%   Breastfeeding No   Visual Acuity Right Eye Distance:   Left Eye Distance:   Bilateral Distance:    Right Eye Near:   Left Eye Near:    Bilateral Near:     Physical Exam Vitals and nursing note reviewed.  Constitutional:      General: She is not in acute distress.    Appearance: Normal appearance. She is not ill-appearing.  HENT:     Head: Normocephalic and atraumatic.  Eyes:     Conjunctiva/sclera: Conjunctivae normal.  Cardiovascular:     Rate and Rhythm: Normal rate.  Pulmonary:     Effort: Pulmonary effort is normal. No  respiratory distress.  Neurological:     Mental Status: She is alert.  Psychiatric:        Mood and Affect: Mood normal.  Behavior: Behavior normal.        Thought Content: Thought content normal.      UC Treatments / Results  Labs (all labs ordered are listed, but only abnormal results are displayed) Labs Reviewed  CERVICOVAGINAL ANCILLARY ONLY    EKG   Radiology No results found.  Procedures Procedures (including critical care time)  Medications Ordered in UC Medications - No data to display  Initial Impression / Assessment and Plan / UC Course  I have reviewed the triage vital signs and the nursing notes.  Pertinent labs & imaging results that were available during my care of the patient were reviewed by me and considered in my medical decision making (see chart for details).     STD screening ordered as well as screening for BV and yeast.  Will await results for further recommendation.  Advise follow-up with any further concerns.   Final Clinical Impressions(s) / UC Diagnoses   Final diagnoses:  Vaginal discharge   Discharge Instructions   None    ED Prescriptions   None    PDMP not reviewed this encounter.   Tomi Bamberger, PA-C 07/13/23 1737

## 2023-07-14 ENCOUNTER — Telehealth: Payer: Self-pay

## 2023-07-14 DIAGNOSIS — N644 Mastodynia: Secondary | ICD-10-CM | POA: Diagnosis not present

## 2023-07-14 DIAGNOSIS — Z3202 Encounter for pregnancy test, result negative: Secondary | ICD-10-CM | POA: Diagnosis not present

## 2023-07-14 LAB — CERVICOVAGINAL ANCILLARY ONLY
Bacterial Vaginitis (gardnerella): POSITIVE — AB
Candida Glabrata: NEGATIVE
Candida Vaginitis: NEGATIVE
Chlamydia: NEGATIVE
Comment: NEGATIVE
Comment: NEGATIVE
Comment: NEGATIVE
Comment: NEGATIVE
Comment: NEGATIVE
Comment: NORMAL
Neisseria Gonorrhea: NEGATIVE
Trichomonas: NEGATIVE

## 2023-07-14 MED ORDER — METRONIDAZOLE 500 MG PO TABS: 500.0000 mg | ORAL_TABLET | Freq: Two times a day (BID) | ORAL | 0 refills | Status: AC

## 2023-07-14 NOTE — Telephone Encounter (Signed)
 Per protocol, pt requires tx with metronidazole. Rx sent to pharmacy on file.

## 2023-07-17 ENCOUNTER — Other Ambulatory Visit: Payer: Self-pay | Admitting: *Deleted

## 2023-07-17 DIAGNOSIS — N644 Mastodynia: Secondary | ICD-10-CM

## 2023-07-30 ENCOUNTER — Telehealth

## 2023-07-30 DIAGNOSIS — T3695XA Adverse effect of unspecified systemic antibiotic, initial encounter: Secondary | ICD-10-CM

## 2023-07-30 DIAGNOSIS — B379 Candidiasis, unspecified: Secondary | ICD-10-CM | POA: Diagnosis not present

## 2023-07-31 ENCOUNTER — Other Ambulatory Visit

## 2023-07-31 MED ORDER — FLUCONAZOLE 150 MG PO TABS: 150.0000 mg | ORAL_TABLET | Freq: Once | ORAL | 0 refills | Status: AC

## 2023-07-31 NOTE — Progress Notes (Signed)

## 2023-08-05 DIAGNOSIS — Z419 Encounter for procedure for purposes other than remedying health state, unspecified: Secondary | ICD-10-CM | POA: Diagnosis not present

## 2023-09-04 DIAGNOSIS — Z419 Encounter for procedure for purposes other than remedying health state, unspecified: Secondary | ICD-10-CM | POA: Diagnosis not present

## 2023-09-06 DIAGNOSIS — J45901 Unspecified asthma with (acute) exacerbation: Secondary | ICD-10-CM | POA: Diagnosis not present

## 2023-09-14 ENCOUNTER — Telehealth: Admitting: Physician Assistant

## 2023-09-14 DIAGNOSIS — N760 Acute vaginitis: Secondary | ICD-10-CM | POA: Diagnosis not present

## 2023-09-14 DIAGNOSIS — B9689 Other specified bacterial agents as the cause of diseases classified elsewhere: Secondary | ICD-10-CM

## 2023-09-14 MED ORDER — METRONIDAZOLE 500 MG PO TABS: 500.0000 mg | ORAL_TABLET | Freq: Two times a day (BID) | ORAL | 0 refills | Status: DC

## 2023-09-14 NOTE — Progress Notes (Signed)
 I have spent 5 minutes in review of e-visit questionnaire, review and updating patient chart, medical decision making and response to patient.   Piedad Climes, PA-C

## 2023-09-14 NOTE — Progress Notes (Signed)

## 2023-09-28 ENCOUNTER — Emergency Department (HOSPITAL_COMMUNITY)
Admission: EM | Admit: 2023-09-28 | Discharge: 2023-09-28 | Disposition: A | Discharge status: Home / Self Care | Attending: Student | Admitting: Student

## 2023-09-28 ENCOUNTER — Emergency Department (HOSPITAL_COMMUNITY)

## 2023-09-28 ENCOUNTER — Other Ambulatory Visit: Payer: Self-pay

## 2023-09-28 DIAGNOSIS — Z720 Tobacco use: Secondary | ICD-10-CM | POA: Insufficient documentation

## 2023-09-28 DIAGNOSIS — J45901 Unspecified asthma with (acute) exacerbation: Secondary | ICD-10-CM | POA: Insufficient documentation

## 2023-09-28 DIAGNOSIS — Z7951 Long term (current) use of inhaled steroids: Secondary | ICD-10-CM | POA: Insufficient documentation

## 2023-09-28 DIAGNOSIS — R0602 Shortness of breath: Secondary | ICD-10-CM | POA: Diagnosis present

## 2023-09-28 DIAGNOSIS — R0989 Other specified symptoms and signs involving the circulatory and respiratory systems: Secondary | ICD-10-CM | POA: Diagnosis not present

## 2023-09-28 DIAGNOSIS — Z743 Need for continuous supervision: Secondary | ICD-10-CM | POA: Diagnosis not present

## 2023-09-28 MED ORDER — PREDNISONE 10 MG PO TABS: 40.0000 mg | ORAL_TABLET | Freq: Every day | ORAL | 0 refills | Status: DC

## 2023-09-28 MED ORDER — IPRATROPIUM BROMIDE 0.02 % IN SOLN: 0.5000 mg | Freq: Once | RESPIRATORY_TRACT | Status: DC

## 2023-09-28 MED ORDER — ALBUTEROL SULFATE (2.5 MG/3ML) 0.083% IN NEBU
10.0000 mg/h | INHALATION_SOLUTION | Freq: Once | RESPIRATORY_TRACT | Status: DC
  Filled 2023-09-28: qty 3

## 2023-09-28 MED ORDER — IPRATROPIUM-ALBUTEROL 0.5-2.5 (3) MG/3ML IN SOLN
3.0000 mL | Freq: Once | RESPIRATORY_TRACT | Status: AC
  Administered 2023-09-28: 3 mL via RESPIRATORY_TRACT
  Filled 2023-09-28: qty 3

## 2023-09-28 NOTE — ED Provider Notes (Signed)
 [place] EMERGENCY DEPARTMENT AT [facility] Provider Note  CSN: [id number] Arrival date & time: 09/28/23 1450  Chief Complaint(s) Asthma  HPI Carol Hendrix is a 23 y.o. female With PMH asthma, anemia who presents emergency room for evaluation of shortness of breath and wheezing.  States that symptoms have been worsening over the last few days and she has been using her albuterol  nebulizer treatments at home without significant improvement.  EMS found her to be wheezing and administered 2 DuoNebs, methylprednisolone  and magnesium  prior to arrival.  She states she feels significantly improved.  Currently denying chest pain, abdominal pain, nausea, vomiting or other systemic symptoms.   Past Medical History Past Medical History:  Diagnosis Date   Anemia    Asthma    Delivery of twins, both live 12/29/2021   Preterm premature rupture of membranes (PPROM) delivered, current hospitalization 12/27/2021   Preterm premature rupture of membranes (PPROM) with onset of labor after 24 hours of rupture in second trimester, antepartum 11/22/2021   Completed antibiotics and BMZ.  Pt left hospital AMA at 29 weeks due to childcare issues   Patient Active Problem List   Diagnosis Date Noted   Influenza A 05/12/2023   Hypokalemia 05/12/2023   Asthma exacerbation 05/06/2023   Postpartum care following vaginal delivery 03/22/2022   Contraception management 03/22/2022   Syncope 08/11/2021   Moderate persistent asthma 03/25/2018   Tobacco use disorder 03/25/2018   Home Medication(s) Prior to Admission medications   Medication Sig Start Date End Date Taking? Authorizing Provider  predniSONE  (DELTASONE ) 10 MG tablet Take 4 tablets (40 mg total) by mouth daily. 09/28/23  Yes Rahel Carlton, MD  acetaminophen  (TYLENOL ) 500 MG tablet Take 1,000 mg by mouth every 6 (six) hours as needed for mild pain (pain score 1-3) or headache.    [provider]  albuterol  (PROVENTIL ) (2.5 MG/3ML)  0.083% nebulizer solution Inhale 3 mLs (2.5 mg total) into the lungs every 4 (four) hours as needed (SOB wheezing). 05/13/23 06/12/23  Unk Garb, DO  albuterol  (VENTOLIN  HFA) 108 (90 Base) MCG/ACT inhaler Inhale 2 puffs into the lungs every 6 (six) hours as needed for wheezing or shortness of breath. 05/13/23   Unk Garb, DO  budesonide -formoterol  (SYMBICORT ) 160-4.5 MCG/ACT inhaler Inhale 2 puffs into the lungs in the morning and at bedtime. 05/13/23   Unk Garb, DO  chlorhexidine (PERIDEX) 0.12 % solution SWISH FOR 1 MINUTE AND SPIT TWICE DAILY FOR 10 DAYS Patient not taking: Reported on 07/13/2023 03/02/23   [provider]  HYDROcodone -acetaminophen  (NORCO/VICODIN) 5-325 MG tablet Take 1 tablet by mouth every 6 (six) hours as needed. Patient not taking: Reported on 07/13/2023 03/02/23   [provider]  ibuprofen  (ADVIL ) 800 MG tablet Take 1 tablet (800 mg total) by mouth every 8 (eight) hours as needed for moderate pain (pain score 4-6), headache or fever. 06/23/23   Starlene Eaton, FNP  metroNIDAZOLE  (FLAGYL ) 500 MG tablet Take 1 tablet (500 mg total) by mouth 2 (two) times daily. 09/14/23   Farris Hong, PA-C  montelukast  (SINGULAIR ) 10 MG tablet Take 1 tablet (10 mg total) by mouth at bedtime. 05/13/23 08/11/23  Unk Garb, DO  oseltamivir  (TAMIFLU ) 75 MG capsule Take by mouth. Patient not taking: Reported on 07/13/2023 05/13/23   [provider]  ipratropium (ATROVENT ) 0.03 % nasal spray Place 2 sprays into both nostrils 2 (two) times daily. Patient not taking: Reported on 07/31/2018 06/21/18 11/13/18  Buena Carmine, NP  Past Surgical History Past Surgical History:  Procedure Laterality Date   NO PAST SURGERIES     Family History Family History  Problem Relation Age of Onset   Healthy Mother    Healthy Father    Pulmonary  fibrosis Maternal Grandmother     Social History Social History   Tobacco Use   Smoking status: Never   Smokeless tobacco: Never   Tobacco comments:    rolls marijuana in with tobacco  Vaping Use   Vaping status: Former   Substances: Nicotine  Substance Use Topics   Alcohol use: Yes    Comment: occ   Drug use: Yes    Types: Marijuana    Comment: last smoked marijuana one year ago as of 07/16/2021   Allergies Patient has no known allergies.  Review of Systems Review of Systems  Respiratory:  Positive for chest tightness, shortness of breath and wheezing.     Physical Exam Vital Signs  I have reviewed the triage vital signs BP 111/69   Pulse 71   Temp 97.6 F (36.4 C) (Oral)   Resp 16   Ht 5\' 5"  (1.651 m)   Wt 79.4 kg   LMP 08/28/2023 (Within Weeks)   SpO2 100%   BMI 29.13 kg/m   Physical Exam Vitals and nursing note reviewed.  Constitutional:      General: She is not in acute distress.    Appearance: She is well-developed.  HENT:     Head: Normocephalic and atraumatic.  Eyes:     Conjunctiva/sclera: Conjunctivae normal.  Cardiovascular:     Rate and Rhythm: Normal rate and regular rhythm.     Heart sounds: No murmur heard. Pulmonary:     Effort: Pulmonary effort is normal. No respiratory distress.     Breath sounds: Wheezing present.  Abdominal:     Palpations: Abdomen is soft.     Tenderness: There is no abdominal tenderness.  Musculoskeletal:        General: No swelling.     Cervical back: Neck supple.  Skin:    General: Skin is warm and dry.     Capillary Refill: Capillary refill takes less than 2 seconds.  Neurological:     Mental Status: She is alert.  Psychiatric:        Mood and Affect: Mood normal.     ED Results and Treatments Labs (all labs ordered are listed, but only abnormal results are displayed) Labs Reviewed - No data to display                                                                                                                         Radiology DG Chest Portable 1 View Result Date: 09/28/2023 CLINICAL DATA:  dyspnea EXAM: PORTABLE CHEST - 1 VIEW COMPARISON:  May 12, 2023 FINDINGS: No focal airspace consolidation, pleural effusion, or pneumothorax. No cardiomegaly. No acute fracture or destructive lesion. IMPRESSION: No acute cardiopulmonary abnormality. Electronically Signed   By: Rance Burrows  M.D.   On: 09/28/2023 16:18    Pertinent labs & imaging results that were available during my care of the patient were reviewed by me and considered in my medical decision making (see MDM for details).  Medications Ordered in ED Medications  ipratropium-albuterol  (DUONEB) 0.5-2.5 (3) MG/3ML nebulizer solution 3 mL (3 mLs Nebulization Given 09/28/23 1613)                                                                                                                                     Procedures Procedures  (including critical care time)  Medical Decision Making / ED Course   This patient presents to the ED for concern of wheezing, shortness of breath, this involves an extensive number of treatment options, and is a complaint that carries with it a high risk of complications and morbidity.  The differential diagnosis includes asthma exacerbation, reactive airway disease, aspiration, pneumonia   MDM: Patient seen emerged part for evaluation of wheezing and shortness of breath.  Physical exam is actually fairly reassuring with mild wheezing but no increased work of breathing, no lower extremity edema.  Chest x-ray unremarkable.  Patient received 1 DuoNeb and on reevaluation the wheezing has resolved and patient is feeling symptomatically improved.  Currently does not meet inpatient criteria for admission and will be discharged with outpatient follow-up.  Return precautions given which she voiced understanding.  Prednisone  sent to pharmacy.   Additional history obtained:  -External records from outside  source obtained and reviewed including: Chart review including previous notes, labs, imaging, consultation notes    Imaging Studies ordered: I ordered imaging studies including chest x-ray I independently visualized and interpreted imaging. I agree with the radiologist interpretation   Medicines ordered and prescription drug management: Meds ordered this encounter  Medications   DISCONTD: albuterol  (PROVENTIL ) (2.5 MG/3ML) 0.083% nebulizer solution   DISCONTD: ipratropium (ATROVENT ) nebulizer solution 0.5 mg   ipratropium-albuterol  (DUONEB) 0.5-2.5 (3) MG/3ML nebulizer solution 3 mL   predniSONE  (DELTASONE ) 10 MG tablet    Sig: Take 4 tablets (40 mg total) by mouth daily.    Dispense:  16 tablet    Refill:  0    -I have reviewed the patients home medicines and have made adjustments as needed  Critical interventions none   Social Determinants of Health:  Factors impacting patients care include: none   Reevaluation: After the interventions noted above, I reevaluated the patient and found that they have :improved  Co morbidities that complicate the patient evaluation  Past Medical History:  Diagnosis Date   Anemia    Asthma    Delivery of twins, both live 12/29/2021   Preterm premature rupture of membranes (PPROM) delivered, current hospitalization 12/27/2021   Preterm premature rupture of membranes (PPROM) with onset of labor after 24 hours of rupture in second trimester, antepartum 11/22/2021   Completed antibiotics and BMZ.  Pt left hospital AMA at 29 weeks due  to childcare issues      Dispostion: I considered admission for this patient, but at this time she does not meet inpatient criteria for admission we discharged outpatient follow-up.     Final Clinical Impression(s) / ED Diagnoses Final diagnoses:  Mild asthma with exacerbation, unspecified whether persistent     @PCDICTATION @    Dymir Carol Hendrix, Carol July, MD 09/28/23 [phone]

## 2023-09-28 NOTE — ED Triage Notes (Signed)
 Pt arrived EMS from work. Hx of asthma. Has been taking home meds but reports not working. EMS states was wheezing on arrived. Patient had 2 duonebs, solumedrol and 2g magnesium  with EMS

## 2023-10-05 DIAGNOSIS — Z419 Encounter for procedure for purposes other than remedying health state, unspecified: Secondary | ICD-10-CM | POA: Diagnosis not present

## 2023-11-04 DIAGNOSIS — Z419 Encounter for procedure for purposes other than remedying health state, unspecified: Secondary | ICD-10-CM | POA: Diagnosis not present

## 2023-11-16 ENCOUNTER — Emergency Department (HOSPITAL_COMMUNITY)

## 2023-11-16 ENCOUNTER — Observation Stay (HOSPITAL_COMMUNITY)
Admission: EM | Admit: 2023-11-16 | Discharge: 2023-11-17 | Disposition: A | Discharge status: Home / Self Care | Attending: General Surgery | Admitting: General Surgery

## 2023-11-16 ENCOUNTER — Other Ambulatory Visit: Payer: Self-pay

## 2023-11-16 DIAGNOSIS — T1490XA Injury, unspecified, initial encounter: Secondary | ICD-10-CM | POA: Diagnosis not present

## 2023-11-16 DIAGNOSIS — F1290 Cannabis use, unspecified, uncomplicated: Secondary | ICD-10-CM | POA: Insufficient documentation

## 2023-11-16 DIAGNOSIS — R52 Pain, unspecified: Secondary | ICD-10-CM | POA: Diagnosis not present

## 2023-11-16 DIAGNOSIS — S299XXA Unspecified injury of thorax, initial encounter: Secondary | ICD-10-CM | POA: Diagnosis not present

## 2023-11-16 DIAGNOSIS — F1090 Alcohol use, unspecified, uncomplicated: Secondary | ICD-10-CM | POA: Diagnosis not present

## 2023-11-16 DIAGNOSIS — S36892A Contusion of other intra-abdominal organs, initial encounter: Secondary | ICD-10-CM | POA: Diagnosis not present

## 2023-11-16 DIAGNOSIS — S12300A Unspecified displaced fracture of fourth cervical vertebra, initial encounter for closed fracture: Secondary | ICD-10-CM | POA: Diagnosis not present

## 2023-11-16 DIAGNOSIS — M542 Cervicalgia: Secondary | ICD-10-CM | POA: Diagnosis present

## 2023-11-16 DIAGNOSIS — S199XXA Unspecified injury of neck, initial encounter: Secondary | ICD-10-CM | POA: Diagnosis not present

## 2023-11-16 DIAGNOSIS — S12390A Other displaced fracture of fourth cervical vertebra, initial encounter for closed fracture: Secondary | ICD-10-CM | POA: Diagnosis not present

## 2023-11-16 DIAGNOSIS — S12301A Unspecified nondisplaced fracture of fourth cervical vertebra, initial encounter for closed fracture: Principal | ICD-10-CM | POA: Insufficient documentation

## 2023-11-16 DIAGNOSIS — S3993XA Unspecified injury of pelvis, initial encounter: Secondary | ICD-10-CM | POA: Diagnosis not present

## 2023-11-16 DIAGNOSIS — S0990XA Unspecified injury of head, initial encounter: Secondary | ICD-10-CM | POA: Diagnosis not present

## 2023-11-16 DIAGNOSIS — S3991XA Unspecified injury of abdomen, initial encounter: Secondary | ICD-10-CM | POA: Diagnosis not present

## 2023-11-16 LAB — RAPID URINE DRUG SCREEN, HOSP PERFORMED
Amphetamines: NOT DETECTED
Barbiturates: NOT DETECTED
Benzodiazepines: NOT DETECTED
Cocaine: NOT DETECTED
Opiates: NOT DETECTED
Tetrahydrocannabinol: POSITIVE — AB

## 2023-11-16 LAB — URINALYSIS, ROUTINE W REFLEX MICROSCOPIC
Bilirubin Urine: NEGATIVE
Glucose, UA: NEGATIVE mg/dL
Hgb urine dipstick: NEGATIVE
Ketones, ur: NEGATIVE mg/dL
Leukocytes,Ua: NEGATIVE
Nitrite: NEGATIVE
Protein, ur: NEGATIVE mg/dL
Specific Gravity, Urine: 1.03 (ref 1.005–1.030)
pH: 6 (ref 5.0–8.0)

## 2023-11-16 LAB — I-STAT CHEM 8, ED
BUN: 15 mg/dL (ref 6–20)
Calcium, Ion: 0.74 mmol/L — CL (ref 1.15–1.40)
Chloride: 108 mmol/L (ref 98–111)
Creatinine, Ser: 0.7 mg/dL (ref 0.44–1.00)
Glucose, Bld: 52 mg/dL — ABNORMAL LOW (ref 70–99)
HCT: 62 % — ABNORMAL HIGH (ref 36.0–46.0)
Hemoglobin: 21.1 g/dL (ref 12.0–15.0)
Potassium: 5.4 mmol/L — ABNORMAL HIGH (ref 3.5–5.1)
Sodium: 139 mmol/L (ref 135–145)
TCO2: 22 mmol/L (ref 22–32)

## 2023-11-16 LAB — COMPREHENSIVE METABOLIC PANEL WITH GFR
ALT: 7 U/L (ref 0–44)
AST: 22 U/L (ref 15–41)
Albumin: 3.6 g/dL (ref 3.5–5.0)
Alkaline Phosphatase: 46 U/L (ref 38–126)
Anion gap: 10 (ref 5–15)
BUN: 11 mg/dL (ref 6–20)
CO2: 20 mmol/L — ABNORMAL LOW (ref 22–32)
Calcium: 8.4 mg/dL — ABNORMAL LOW (ref 8.9–10.3)
Chloride: 107 mmol/L (ref 98–111)
Creatinine, Ser: 0.77 mg/dL (ref 0.44–1.00)
GFR, Estimated: >60 mL/min (ref >60)
Glucose, Bld: 126 mg/dL — ABNORMAL HIGH (ref 70–99)
Potassium: 3.8 mmol/L (ref 3.5–5.1)
Sodium: 137 mmol/L (ref 135–145)
Total Bilirubin: 0.9 mg/dL (ref 0.0–1.2)
Total Protein: 6.6 g/dL (ref 6.5–8.1)

## 2023-11-16 LAB — PROTIME-INR
INR: 1 (ref 0.8–1.2)
Prothrombin Time: 14.1 s (ref 11.4–15.2)

## 2023-11-16 LAB — I-STAT CG4 LACTIC ACID, ED: Lactic Acid, Venous: 1.8 mmol/L (ref 0.5–1.9)

## 2023-11-16 LAB — CBC
HCT: 37.8 % (ref 36.0–46.0)
Hemoglobin: 12.1 g/dL (ref 12.0–15.0)
MCH: 27.2 pg (ref 26.0–34.0)
MCHC: 32 g/dL (ref 30.0–36.0)
MCV: 84.9 fL (ref 80.0–100.0)
Platelets: 267 K/uL (ref 150–400)
RBC: 4.45 MIL/uL (ref 3.87–5.11)
RDW: 15.1 % (ref 11.5–15.5)
WBC: 12.3 K/uL — ABNORMAL HIGH (ref 4.0–10.5)
nRBC: 0 % (ref 0.0–0.2)

## 2023-11-16 LAB — SAMPLE TO BLOOD BANK

## 2023-11-16 LAB — ETHANOL: Alcohol, Ethyl (B): <15 mg/dL (ref <15)

## 2023-11-16 LAB — HCG, SERUM, QUALITATIVE: Preg, Serum: NEGATIVE

## 2023-11-16 MED ORDER — SODIUM CHLORIDE 0.9 % IV SOLN: INTRAVENOUS | Status: DC

## 2023-11-16 MED ORDER — METHOCARBAMOL 1000 MG/10ML IJ SOLN: 500.0000 mg | Freq: Three times a day (TID) | INTRAMUSCULAR | Status: DC

## 2023-11-16 MED ORDER — POLYETHYLENE GLYCOL 3350 17 G PO PACK: 17.0000 g | PACK | Freq: Every day | ORAL | Status: DC | PRN

## 2023-11-16 MED ORDER — METHOCARBAMOL 500 MG PO TABS
500.0000 mg | ORAL_TABLET | Freq: Three times a day (TID) | ORAL | Status: DC
  Administered 2023-11-16 – 2023-11-17 (×2): 500 mg via ORAL
  Filled 2023-11-16 (×2): qty 1

## 2023-11-16 MED ORDER — METOPROLOL TARTRATE 5 MG/5ML IV SOLN: 5.0000 mg | Freq: Four times a day (QID) | INTRAVENOUS | Status: DC | PRN

## 2023-11-16 MED ORDER — OXYCODONE HCL 5 MG PO TABS
10.0000 mg | ORAL_TABLET | ORAL | Status: DC | PRN
  Administered 2023-11-16 – 2023-11-17 (×2): 10 mg via ORAL
  Filled 2023-11-16 (×2): qty 2

## 2023-11-16 MED ORDER — ONDANSETRON HCL 4 MG/2ML IJ SOLN: 4.0000 mg | Freq: Four times a day (QID) | INTRAMUSCULAR | Status: DC | PRN

## 2023-11-16 MED ORDER — FENTANYL CITRATE PF 50 MCG/ML IJ SOSY
50.0000 ug | PREFILLED_SYRINGE | Freq: Once | INTRAMUSCULAR | Status: AC
  Administered 2023-11-16: 50 ug via INTRAVENOUS
  Filled 2023-11-16: qty 1

## 2023-11-16 MED ORDER — ALBUTEROL SULFATE (2.5 MG/3ML) 0.083% IN NEBU: 3.0000 mL | INHALATION_SOLUTION | Freq: Four times a day (QID) | RESPIRATORY_TRACT | Status: DC | PRN

## 2023-11-16 MED ORDER — IOHEXOL 350 MG/ML SOLN
75.0000 mL | Freq: Once | INTRAVENOUS | Status: AC | PRN
Start: 2023-11-16 — End: 2023-11-16
  Administered 2023-11-16: 75 mL via INTRAVENOUS

## 2023-11-16 MED ORDER — ONDANSETRON 4 MG PO TBDP: 4.0000 mg | ORAL_TABLET | Freq: Four times a day (QID) | ORAL | Status: DC | PRN

## 2023-11-16 MED ORDER — DOCUSATE SODIUM 100 MG PO CAPS
100.0000 mg | ORAL_CAPSULE | Freq: Two times a day (BID) | ORAL | Status: DC
  Filled 2023-11-16 (×2): qty 1

## 2023-11-16 MED ORDER — ONDANSETRON HCL 4 MG/2ML IJ SOLN
4.0000 mg | Freq: Once | INTRAMUSCULAR | Status: AC
  Administered 2023-11-16: 4 mg via INTRAVENOUS
  Filled 2023-11-16: qty 2

## 2023-11-16 MED ORDER — ENOXAPARIN SODIUM 30 MG/0.3ML IJ SOSY
30.0000 mg | PREFILLED_SYRINGE | Freq: Two times a day (BID) | INTRAMUSCULAR | Status: DC
  Filled 2023-11-16: qty 0.3

## 2023-11-16 MED ORDER — HYDRALAZINE HCL 20 MG/ML IJ SOLN: 10.0000 mg | INTRAMUSCULAR | Status: DC | PRN

## 2023-11-16 MED ORDER — OXYCODONE HCL 5 MG PO TABS: 5.0000 mg | ORAL_TABLET | ORAL | Status: DC | PRN

## 2023-11-16 MED ORDER — MELATONIN 3 MG PO TABS: 3.0000 mg | ORAL_TABLET | Freq: Every evening | ORAL | Status: DC | PRN

## 2023-11-16 MED ORDER — ACETAMINOPHEN 500 MG PO TABS
1000.0000 mg | ORAL_TABLET | Freq: Once | ORAL | Status: AC
  Administered 2023-11-16: 1000 mg via ORAL
  Filled 2023-11-16: qty 2

## 2023-11-16 MED ORDER — OXYCODONE-ACETAMINOPHEN 5-325 MG PO TABS
1.0000 | ORAL_TABLET | Freq: Once | ORAL | Status: AC
  Administered 2023-11-16: 1 via ORAL
  Filled 2023-11-16: qty 1

## 2023-11-16 MED ORDER — ACETAMINOPHEN 500 MG PO TABS
1000.0000 mg | ORAL_TABLET | Freq: Four times a day (QID) | ORAL | Status: DC
  Administered 2023-11-17: 1000 mg via ORAL
  Filled 2023-11-16 (×2): qty 2

## 2023-11-16 NOTE — ED Notes (Signed)
 Pt resting with eyes closed; respirations spontaneous, even, unlabored

## 2023-11-16 NOTE — ED Notes (Signed)
 C-collar now back on pt.

## 2023-11-16 NOTE — ED Notes (Signed)
 PT transferred into [place] neck brace

## 2023-11-16 NOTE — ED Notes (Signed)
 Dinner tray ordered.

## 2023-11-16 NOTE — ED Notes (Signed)
 CCMD called, pt on monitor

## 2023-11-16 NOTE — ED Notes (Signed)
 4 police at bedside
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
## 2023-11-16 NOTE — ED Notes (Signed)
 Mother at bedside.

## 2023-11-16 NOTE — ED Notes (Addendum)
 Pt took C-Collar off and stating she wants her neck to be wrapped in ace bandages. This RN informed that that is contraindicated at this moment. EDP informed. GCS 15
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
## 2023-11-16 NOTE — ED Notes (Signed)
This paramedic accompanied patient to CT
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
## 2023-11-17 DIAGNOSIS — S12390A Other displaced fracture of fourth cervical vertebra, initial encounter for closed fracture: Secondary | ICD-10-CM | POA: Diagnosis not present

## 2023-11-17 DIAGNOSIS — S36892A Contusion of other intra-abdominal organs, initial encounter: Secondary | ICD-10-CM | POA: Diagnosis not present

## 2023-11-17 LAB — BASIC METABOLIC PANEL WITH GFR
Anion gap: 9 (ref 5–15)
BUN: 6 mg/dL (ref 6–20)
CO2: 22 mmol/L (ref 22–32)
Calcium: 8.2 mg/dL — ABNORMAL LOW (ref 8.9–10.3)
Chloride: 108 mmol/L (ref 98–111)
Creatinine, Ser: 0.75 mg/dL (ref 0.44–1.00)
GFR, Estimated: >60 mL/min (ref >60)
Glucose, Bld: 89 mg/dL (ref 70–99)
Potassium: 3.4 mmol/L — ABNORMAL LOW (ref 3.5–5.1)
Sodium: 139 mmol/L (ref 135–145)

## 2023-11-17 LAB — CBC
HCT: 36.2 % (ref 36.0–46.0)
Hemoglobin: 11.6 g/dL — ABNORMAL LOW (ref 12.0–15.0)
MCH: 27.2 pg (ref 26.0–34.0)
MCHC: 32 g/dL (ref 30.0–36.0)
MCV: 84.8 fL (ref 80.0–100.0)
Platelets: 235 K/uL (ref 150–400)
RBC: 4.27 MIL/uL (ref 3.87–5.11)
RDW: 15.4 % (ref 11.5–15.5)
WBC: 10.1 K/uL (ref 4.0–10.5)
nRBC: 0 % (ref 0.0–0.2)

## 2023-11-17 MED ORDER — OXYCODONE HCL 5 MG PO TABS: 5.0000 mg | ORAL_TABLET | ORAL | 0 refills | Status: AC | PRN

## 2023-11-17 MED ORDER — METHOCARBAMOL 500 MG PO TABS: 500.0000 mg | ORAL_TABLET | Freq: Three times a day (TID) | ORAL | 0 refills | Status: AC | PRN

## 2023-11-17 MED ORDER — ACETAMINOPHEN 500 MG PO TABS: 1000.0000 mg | ORAL_TABLET | Freq: Four times a day (QID) | ORAL | Status: AC
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
## 2023-11-17 NOTE — Progress Notes (Signed)
 Patient removed her C-collar. This nurse re-educated her on the importance of maintaining cervical spine precautions and the potential risks of removing the collar prematurely. Patient non-receptive to information provided - remains non compliant - stated that it is hurting her neck. Attending physician, Dr. Camellia Blush informed.

## 2023-11-17 NOTE — Progress Notes (Signed)
 Patient observed vaping in her room. She was advised that vaping is not permitted in the facility. She was also educated on hospital's policy regarding smoking. Patient verbalized understanding. Charge nurse on duty informed. Vape x1 (yellow) removed from patient's person, labelled and placed in drawer at front nurse's station.
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
## 2023-11-17 NOTE — Plan of Care (Signed)
  Problem: Coping: Goal: Level of anxiety will decrease Outcome: Progressing   Problem: Pain Managment: Goal: General experience of comfort will improve and/or be controlled Outcome: Progressing   Problem: Safety: Goal: Ability to remain free from injury will improve Outcome: Progressing   Problem: Skin Integrity: Goal: Risk for impaired skin integrity will decrease Outcome: Progressing

## 2023-11-24 ENCOUNTER — Encounter (HOSPITAL_COMMUNITY): Payer: Self-pay

## 2023-11-24 ENCOUNTER — Other Ambulatory Visit: Payer: Self-pay

## 2023-11-24 ENCOUNTER — Emergency Department (HOSPITAL_COMMUNITY)
Admission: EM | Admit: 2023-11-24 | Discharge: 2023-11-24 | Discharge status: Against Medical Advice | Attending: Emergency Medicine | Admitting: Emergency Medicine

## 2023-11-24 DIAGNOSIS — M542 Cervicalgia: Secondary | ICD-10-CM | POA: Diagnosis not present

## 2023-11-24 DIAGNOSIS — Z5321 Procedure and treatment not carried out due to patient leaving prior to being seen by health care provider: Secondary | ICD-10-CM | POA: Diagnosis not present

## 2023-11-24 DIAGNOSIS — Y92410 Unspecified street and highway as the place of occurrence of the external cause: Secondary | ICD-10-CM | POA: Diagnosis not present
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
## 2023-11-24 NOTE — ED Notes (Signed)
 Patient called for room x3 without response.
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
## 2023-12-05 DIAGNOSIS — Z419 Encounter for procedure for purposes other than remedying health state, unspecified: Secondary | ICD-10-CM | POA: Diagnosis not present

## 2024-01-05 DIAGNOSIS — Z419 Encounter for procedure for purposes other than remedying health state, unspecified: Secondary | ICD-10-CM | POA: Diagnosis not present

## 2024-02-04 DIAGNOSIS — Z419 Encounter for procedure for purposes other than remedying health state, unspecified: Secondary | ICD-10-CM | POA: Diagnosis not present

## 2024-03-14 DIAGNOSIS — N764 Abscess of vulva: Secondary | ICD-10-CM | POA: Diagnosis not present

## 2024-04-05 DIAGNOSIS — Z419 Encounter for procedure for purposes other than remedying health state, unspecified: Secondary | ICD-10-CM | POA: Diagnosis not present

## 2024-05-03 ENCOUNTER — Telehealth: Admitting: Family Medicine

## 2024-05-03 DIAGNOSIS — B9689 Other specified bacterial agents as the cause of diseases classified elsewhere: Secondary | ICD-10-CM | POA: Diagnosis not present

## 2024-05-03 DIAGNOSIS — N760 Acute vaginitis: Secondary | ICD-10-CM | POA: Diagnosis not present

## 2024-05-03 DIAGNOSIS — B3731 Acute candidiasis of vulva and vagina: Secondary | ICD-10-CM | POA: Diagnosis not present

## 2024-05-03 MED ORDER — FLUCONAZOLE 150 MG PO TABS: 150.0000 mg | ORAL_TABLET | Freq: Every day | ORAL | 0 refills | Status: AC

## 2024-05-03 MED ORDER — METRONIDAZOLE 500 MG PO TABS: 500.0000 mg | ORAL_TABLET | Freq: Two times a day (BID) | ORAL | 0 refills | Status: AC

## 2024-05-03 NOTE — Progress Notes (Signed)
 We are sorry that you are not feeling well. Here is how we plan to help! Based on what you shared with me it looks like you: May have a vaginosis due to bacteria  Vaginosis is an inflammation of the vagina that can result in discharge, itching and pain. The cause is usually a change in the normal balance of vaginal bacteria or an infection. Vaginosis can also result from reduced estrogen levels after menopause.  The most common causes of vaginosis are:   Bacterial vaginosis which results from an overgrowth of one on several organisms that are normally present in your vagina.   Yeast infections which are caused by a naturally occurring fungus called candida.   Vaginal atrophy (atrophic vaginosis) which results from the thinning of the vagina from reduced estrogen levels after menopause.   Trichomoniasis which is caused by a parasite and is commonly transmitted by sexual intercourse.  Factors that increase your risk of developing vaginosis include: Medications, such as antibiotics and steroids Uncontrolled diabetes Use of hygiene products such as bubble bath, vaginal spray or vaginal deodorant Douching Wearing damp or tight-fitting clothing Using an intrauterine device (IUD) for birth control Hormonal changes, such as those associated with pregnancy, birth control pills or menopause Sexual activity Having a sexually transmitted infection  Your treatment plan is Metronidazole  or Flagyl  500mg  twice a day for 7 days.  I have electronically sent this prescription into the pharmacy that you have chosen. I will also send diflucan  for yeast.   Be sure to take all of the medication as directed. Stop taking any medication if you develop a rash, tongue swelling or shortness of breath. Mothers who are breast feeding should consider pumping and discarding their breast milk while on these antibiotics. However, there is no consensus that infant exposure at these doses would be harmful.  Remember that  medication creams can weaken latex condoms.   HOME CARE:  Good hygiene may prevent some types of vaginosis from recurring and may relieve some symptoms:  Avoid baths, hot tubs and whirlpool spas. Rinse soap from your outer genital area after a shower, and dry the area well to prevent irritation. Don't use scented or harsh soaps, such as those with deodorant or antibacterial action. Avoid irritants. These include scented tampons and pads. Wipe from front to back after using the toilet. Doing so avoids spreading fecal bacteria to your vagina.  Other things that may help prevent vaginosis include:  Don't douche. Your vagina doesn't require cleansing other than normal bathing. Repetitive douching disrupts the normal organisms that reside in the vagina and can actually increase your risk of vaginal infection. Douching won't clear up a vaginal infection. Use a latex condom. Both female and female latex condoms may help you avoid infections spread by sexual contact. Wear cotton underwear. Also wear pantyhose with a cotton crotch. If you feel comfortable without it, skip wearing underwear to bed. Yeast thrives in [facility] Your symptoms should improve in the next day or two.  GET HELP RIGHT AWAY IF:  You have pain in your lower abdomen ( pelvic area or over your ovaries) You develop nausea or vomiting You develop a fever Your discharge changes or worsens You have persistent pain with intercourse You develop shortness of breath, a rapid pulse, or you faint.  These symptoms could be signs of problems or infections that need to be evaluated by a medical provider now.  MAKE SURE YOU   Understand these instructions. Will watch your condition. Will get  help right away if you are not doing well or get worse.  Your e-visit answers were reviewed by a board certified advanced clinical practitioner to complete your personal care plan. Depending upon the condition, your plan could have included  both over the counter or prescription medications. Please review your pharmacy choice to make sure that you have choses a pharmacy that is open for you to pick up any needed prescription, Your safety is important to us . If you have drug allergies check your prescription carefully.   You can use MyChart to ask questions about todays visit, request a non-urgent call back, or ask for a work or school excuse for 24 hours related to this e-Visit. If it has been greater than 24 hours you will need to follow up with your provider, or enter a new e-Visit to address those concerns. You will get a MyChart message within the next two days asking about your experience. I hope that your e-visit has been valuable and will speed your recovery.  I have spent 5 minutes in review of e-visit questionnaire, review and updating patient chart, medical decision making and response to patient.   Ayani Ospina, FNP

## 2024-05-12 ENCOUNTER — Other Ambulatory Visit: Payer: Self-pay

## 2024-05-12 ENCOUNTER — Emergency Department (HOSPITAL_COMMUNITY)

## 2024-05-12 ENCOUNTER — Encounter (HOSPITAL_COMMUNITY): Payer: Self-pay

## 2024-05-12 ENCOUNTER — Emergency Department (HOSPITAL_COMMUNITY)
Admission: EM | Admit: 2024-05-12 | Discharge: 2024-05-12 | Disposition: A | Discharge status: Home / Self Care | Attending: Emergency Medicine | Admitting: Emergency Medicine

## 2024-05-12 DIAGNOSIS — J4541 Moderate persistent asthma with (acute) exacerbation: Secondary | ICD-10-CM | POA: Diagnosis not present

## 2024-05-12 DIAGNOSIS — J101 Influenza due to other identified influenza virus with other respiratory manifestations: Secondary | ICD-10-CM | POA: Insufficient documentation

## 2024-05-12 DIAGNOSIS — R059 Cough, unspecified: Secondary | ICD-10-CM | POA: Diagnosis present

## 2024-05-12 LAB — CBC WITH DIFFERENTIAL/PLATELET
Abs Immature Granulocytes: 0.02 K/uL (ref 0.00–0.07)
Basophils Absolute: 0 K/uL (ref 0.0–0.1)
Basophils Relative: 0 %
Eosinophils Absolute: 0.1 K/uL (ref 0.0–0.5)
Eosinophils Relative: 2 %
HCT: 41.6 % (ref 36.0–46.0)
Hemoglobin: 13.5 g/dL (ref 12.0–15.0)
Immature Granulocytes: 0 %
Lymphocytes Relative: 39 %
Lymphs Abs: 3 K/uL (ref 0.7–4.0)
MCH: 26.7 pg (ref 26.0–34.0)
MCHC: 32.5 g/dL (ref 30.0–36.0)
MCV: 82.2 fL (ref 80.0–100.0)
Monocytes Absolute: 0.7 K/uL (ref 0.1–1.0)
Monocytes Relative: 10 %
Neutro Abs: 3.8 K/uL (ref 1.7–7.7)
Neutrophils Relative %: 49 %
Platelets: 265 K/uL (ref 150–400)
RBC: 5.06 MIL/uL (ref 3.87–5.11)
RDW: 15.8 % — ABNORMAL HIGH (ref 11.5–15.5)
WBC: 7.7 K/uL (ref 4.0–10.5)
nRBC: 0 % (ref 0.0–0.2)

## 2024-05-12 LAB — COMPREHENSIVE METABOLIC PANEL WITH GFR
ALT: 20 U/L (ref 0–44)
AST: 22 U/L (ref 15–41)
Albumin: 4.1 g/dL (ref 3.5–5.0)
Alkaline Phosphatase: 62 U/L (ref 38–126)
Anion gap: 12 (ref 5–15)
BUN: 5 mg/dL — ABNORMAL LOW (ref 6–20)
CO2: 23 mmol/L (ref 22–32)
Calcium: 8.9 mg/dL (ref 8.9–10.3)
Chloride: 104 mmol/L (ref 98–111)
Creatinine, Ser: 0.59 mg/dL (ref 0.44–1.00)
GFR, Estimated: >60 mL/min
Glucose, Bld: 97 mg/dL (ref 70–99)
Potassium: 3.6 mmol/L (ref 3.5–5.1)
Sodium: 139 mmol/L (ref 135–145)
Total Bilirubin: 0.6 mg/dL (ref 0.0–1.2)
Total Protein: 7.7 g/dL (ref 6.5–8.1)

## 2024-05-12 LAB — RESP PANEL BY RT-PCR (RSV, FLU A&B, COVID)  RVPGX2
Influenza A by PCR: NEGATIVE
Influenza B by PCR: POSITIVE — AB
Resp Syncytial Virus by PCR: NEGATIVE
SARS Coronavirus 2 by RT PCR: NEGATIVE

## 2024-05-12 LAB — HCG, SERUM, QUALITATIVE: Preg, Serum: NEGATIVE

## 2024-05-12 MED ORDER — METHYLPREDNISOLONE 4 MG PO TBPK: ORAL_TABLET | ORAL | 0 refills | Status: AC

## 2024-05-12 MED ORDER — BENZONATATE 100 MG PO CAPS: 100.0000 mg | ORAL_CAPSULE | Freq: Three times a day (TID) | ORAL | 0 refills | Status: AC

## 2024-05-12 MED ORDER — PREDNISONE 20 MG PO TABS
60.0000 mg | ORAL_TABLET | Freq: Once | ORAL | Status: AC
  Administered 2024-05-12: 60 mg via ORAL
  Filled 2024-05-12: qty 3

## 2024-05-12 MED ORDER — FLUTICASONE PROPIONATE 50 MCG/ACT NA SUSP: 2.0000 | Freq: Every day | NASAL | 0 refills | Status: AC

## 2024-05-12 MED ORDER — CETIRIZINE HCL 10 MG PO TABS: 10.0000 mg | ORAL_TABLET | Freq: Every day | ORAL | 0 refills | Status: AC

## 2024-05-12 MED ORDER — ALBUTEROL SULFATE (2.5 MG/3ML) 0.083% IN NEBU: 2.5000 mg | INHALATION_SOLUTION | RESPIRATORY_TRACT | 0 refills | Status: AC | PRN

## 2024-05-12 MED ORDER — IPRATROPIUM-ALBUTEROL 0.5-2.5 (3) MG/3ML IN SOLN
3.0000 mL | Freq: Once | RESPIRATORY_TRACT | Status: AC
  Administered 2024-05-12: 3 mL via RESPIRATORY_TRACT
  Filled 2024-05-12: qty 3

## 2024-05-12 NOTE — ED Notes (Signed)
 Delay in triage. Pt went to get phone charger out of car.

## 2024-05-12 NOTE — ED Provider Triage Note (Signed)
 Emergency Medicine Provider Triage Evaluation Note  Carol Hendrix , a 24 y.o. female  was evaluated in triage.  Pt complains of generalized body aches, chills, fever, shortness of breath, as well as sore throat.  Patient states that her symptoms have been present approximately 1 week.  Patient has attempted to rest however is not feeling any better.  Does have a history of asthma and has been using her breathing treatments at home as well as as her albuterol  inhaler for help with shortness of breath.  She has seen mild improvement.  No known sick contacts.  Review of Systems  Positive: Neurolyse body aches, fatigue, fever, chills, shortness of breath, throat pain Negative: Speaking or swallowing, chest pain  Physical Exam  BP (!) 133/103 (BP Location: Right Arm)   Pulse 93   Temp 98.2 F (36.8 C)   Resp 20   Ht 5' 5 (1.651 m)   Wt 67.6 kg   SpO2 95%   BMI 24.79 kg/m  Gen:   Awake, no distress   Resp:  Normal effort, talking in full sentences on room air MSK:   Moves extremities without difficulty  Other:    Medical Decision Making  Medically screening exam initiated at 6:34 PM.  Appropriate orders placed.  Dail ONEIDA Seeds was informed that the remainder of the evaluation will be completed by another provider, this initial triage assessment does not replace that evaluation, and the importance of remaining in the ED until their evaluation is complete.  Orders: CBC, CMP, pregnancy, respiratory panel, EKG, chest x-ray   Janetta Terrall FALCON, [place] 05/12/24 1836

## 2024-05-12 NOTE — ED Provider Notes (Signed)
 " [place] EMERGENCY DEPARTMENT AT [facility] Provider Note   CSN: [id number] Arrival date & time: 05/12/24  1813     Patient presents with: Generalized Body Aches   Carol JAGIELLO is a 24 y.o. female here for evaluation of unwell.  About 1 week of cough, congestion, rhinorrhea, shortness of breath, myalgias.  Associated fever at home.  Cough productive of yellow sputum.  No known sick contacts.  Some nausea without vomiting.  Still having some shortness of breath.  History of asthma using inhaler at home without relief.  No pain or swelling to lower legs.  No abdominal pain.  No emesis, ha, neck stiffness or neck rigidity   HPI     Prior to Admission medications  Medication Sig Start Date End Date Taking? Authorizing Provider  benzonatate  (TESSALON ) 100 MG capsule Take 1 capsule (100 mg total) by mouth every 8 (eight) hours. 05/12/24  Yes Adelyne Marchese A, PA-C  cetirizine  (ZYRTEC  ALLERGY) 10 MG tablet Take 1 tablet (10 mg total) by mouth daily. 05/12/24 06/11/24 Yes Gaetana Kawahara A, PA-C  fluticasone  (FLONASE ) 50 MCG/ACT nasal spray Place 2 sprays into both nostrils daily. 05/12/24  Yes Electra Paladino A, PA-C  methylPREDNISolone  (MEDROL  DOSEPAK) 4 MG TBPK tablet Take as prescribed on the box 05/12/24  Yes Nicky Milhouse A, PA-C  acetaminophen  (TYLENOL ) 500 MG tablet Take 2 tablets (1,000 mg total) by mouth every 6 (six) hours. 11/17/23   Tammy Sor, PA-C  albuterol  (PROVENTIL ) (2.5 MG/3ML) 0.083% nebulizer solution Inhale 3 mLs (2.5 mg total) into the lungs every 4 (four) hours as needed (SOB wheezing). 05/12/24 06/11/24  Eulalia Ellerman A, PA-C  albuterol  (VENTOLIN  HFA) 108 (90 Base) MCG/ACT inhaler Inhale 2 puffs into the lungs every 6 (six) hours as needed for wheezing or shortness of breath. 05/13/23   Laurence Locus, DO  ibuprofen  (ADVIL ) 800 MG tablet Take 1 tablet (800 mg total) by mouth every 8 (eight) hours as needed for moderate pain (pain score 4-6), headache or  fever. 06/23/23   Enedelia Dorna HERO, FNP  methocarbamol  (ROBAXIN ) 500 MG tablet Take 1 tablet (500 mg total) by mouth every 8 (eight) hours as needed for muscle spasms. 11/17/23   Tammy Sor, PA-C  montelukast  (SINGULAIR ) 10 MG tablet Take 1 tablet (10 mg total) by mouth at bedtime. Patient taking differently: Take 10 mg by mouth daily. 05/13/23 12/30/23  Laurence Locus, DO  oxyCODONE  (OXY IR/ROXICODONE ) 5 MG immediate release tablet Take 1 tablet (5 mg total) by mouth every 4 (four) hours as needed for moderate pain (pain score 4-6). 11/17/23   Tammy Sor, PA-C  ipratropium (ATROVENT ) 0.03 % nasal spray Place 2 sprays into both nostrils 2 (two) times daily. Patient not taking: Reported on 07/31/2018 06/21/18 11/13/18  Arloa Suzen RAMAN, NP    Allergies: Patient has no known allergies.    Review of Systems  Constitutional:  Positive for activity change, chills, fatigue and fever.  HENT:  Positive for congestion, postnasal drip, rhinorrhea and sore throat.   Respiratory:  Positive for cough, shortness of breath and wheezing.   Cardiovascular: Negative.   Gastrointestinal:  Positive for nausea. Negative for abdominal pain and vomiting.  Genitourinary: Negative.   Musculoskeletal:  Positive for myalgias. Negative for neck pain and neck stiffness.  Neurological:  Positive for weakness (generalized).  All other systems reviewed and are negative.   Updated Vital Signs BP 112/72 (BP Location: Right Arm)   Pulse 88   Temp 98.9 F (37.2  C) (Oral)   Resp 17   Ht 5' 5 (1.651 m)   Wt 67.6 kg   SpO2 100%   BMI 24.79 kg/m   Physical Exam Vitals and nursing note reviewed.  Constitutional:      General: She is not in acute distress.    Appearance: She is well-developed. She is not ill-appearing, toxic-appearing or diaphoretic.  HENT:     Head: Normocephalic and atraumatic.     Nose: Congestion and rhinorrhea present.     Mouth/Throat:     Comments: Posterior pharynx clear.  Uvula midline.   No pooling of secretions.  No tonsillar edema or exudate.  Sublingual area soft. Eyes:     Pupils: Pupils are equal, round, and reactive to light.  Neck:     Comments: Full range of motion, no rigidity, meningismus Cardiovascular:     Rate and Rhythm: Normal rate.  Pulmonary:     Effort: No respiratory distress.     Breath sounds: Wheezing present.     Comments: Expiratory wheeze bilaterally Abdominal:     General: Bowel sounds are normal. There is no distension.     Palpations: Abdomen is soft.     Tenderness: There is no abdominal tenderness. There is no right CVA tenderness, left CVA tenderness or guarding.  Musculoskeletal:        General: Normal range of motion.     Cervical back: Normal range of motion and neck supple.     Comments: No bony tenderness, compartment soft, full range of motion  Skin:    General: Skin is warm and dry.     Capillary Refill: Capillary refill takes less than 2 seconds.  Neurological:     General: No focal deficit present.     Mental Status: She is alert.     Cranial Nerves: No cranial nerve deficit.     Sensory: No sensory deficit.     Motor: No weakness.     Gait: Gait normal.  Psychiatric:        Mood and Affect: Mood normal.     (all labs ordered are listed, but only abnormal results are displayed) Labs Reviewed  RESP PANEL BY RT-PCR (RSV, FLU A&B, COVID)  RVPGX2 - Abnormal; Notable for the following components:      Result Value   Influenza B by PCR POSITIVE (*)    All other components within normal limits  CBC WITH DIFFERENTIAL/PLATELET - Abnormal; Notable for the following components:   RDW 15.8 (*)    All other components within normal limits  COMPREHENSIVE METABOLIC PANEL WITH GFR - Abnormal; Notable for the following components:   BUN 5 (*)    All other components within normal limits  HCG, SERUM, QUALITATIVE    EKG: EKG Interpretation Date/Time:  Sunday May 12 2024 18:37:21 EST Ventricular Rate:  87 PR  Interval:  154 QRS Duration:  82 QT Interval:  376 QTC Calculation: 452 R Axis:   92  Text Interpretation: Normal sinus rhythm with sinus arrhythmia Rightward axis Nonspecific ST and T wave abnormality Abnormal ECG No significant change since last tracing Confirmed by Emil Share [phone]) on 05/12/2024 7:13:13 PM  Radiology: [facility] Chest 2 View Result Date: 05/12/2024 EXAM: 2 VIEW(S) XRAY OF THE CHEST 05/12/2024 06:53:00 PM COMPARISON: 11/16/2023 CLINICAL HISTORY: shortness of breath, cough FINDINGS: LUNGS AND PLEURA: No focal pulmonary opacity. No pleural effusion. No pneumothorax. HEART AND MEDIASTINUM: No acute abnormality of the cardiac and mediastinal silhouettes. BONES AND SOFT TISSUES: No acute osseous  abnormality. IMPRESSION: 1. No acute process. Electronically signed by: Greig Pique MD 05/12/2024 06:56 PM EST RP Workstation: [id number]     Procedures   Medications Ordered in the ED  ipratropium-albuterol  (DUONEB) 0.5-2.5 (3) MG/3ML nebulizer solution 3 mL (3 mLs Nebulization Given 05/12/24 2108)  predniSONE  (DELTASONE ) tablet 60 mg (60 mg Oral Given 05/12/24 1064)   24 year old history of asthma here for evaluation of feeling unwell.  1 week of congestion, rhinorrhea, cough, shortness of breath.  Does have a history of asthma.  Thought she likely had influenza however not improving with OTC meds.  Here she is afebrile, nonseptic, non-ill-appearing.  She is PERC negative.  Appears clinically well-hydrated.  Her posterior pharynx is clear.  Her uvula is midline.  She has no tonsillar edema or exudate.  Low suspicion for PTA, RPA, strep pharyngitis.  She has no neck stiffness or neck rigidity.  I have low suspicion for meningitis.  No obvious rashes or lesions.  Her abdomen is soft, nontender.  She does have diffuse expiratory wheeze.  Plan on nebulizer treatment, steroids, labs, imaging, reassess  Labs and imaging personally viewed and interpreted:  CBC without leukocytosis Metabolic panel  without significant abnormality Pregnancy test negative Influenza B positive Chest x-ray without cardiomegaly, pulm edema, pneumothorax EKG without ischemic changes  Patient reassessed after steroids and breathing treatment.  Lung sounds improved.  No hypoxia.  Suspect influenza causing asthma exacerbation.  Low suspicion for PE, bacterial infectious process, ACS, endocarditis, pericarditis, endocarditis.  Tolerating p.o. intake here.  Will have her follow-up outpatient, return for new or worsening symptoms.  I refilled her albuterol  nebulizer at home as well.  Will do Medrol  Dosepak.  The patient has been appropriately medically screened and/or stabilized in the ED. I have low suspicion for any other emergent medical condition which would require further screening, evaluation or treatment in the ED or require inpatient management.  Patient is hemodynamically stable and in no acute distress.  Patient able to ambulate in department prior to ED.  Evaluation does not show acute pathology that would require ongoing or additional emergent interventions while in the emergency department or further inpatient treatment.  I have discussed the diagnosis with the patient and answered all questions.  Pain is been managed while in the emergency department and patient has no further complaints prior to discharge.  Patient is comfortable with plan discussed in room and is stable for discharge at this time.  I have discussed strict return precautions for returning to the emergency department.  Patient was encouraged to follow-up with PCP/specialist refer to at discharge.                                   Medical Decision Making Amount and/or Complexity of Data Reviewed External Data Reviewed: labs, radiology, ECG and notes. Labs: ordered. Decision-making details documented in ED Course. Radiology: ordered and independent interpretation performed. Decision-making details documented in ED Course. ECG/medicine  tests: ordered and independent interpretation performed. Decision-making details documented in ED Course.  Risk OTC drugs. Prescription drug management. Decision regarding hospitalization. Diagnosis or treatment significantly limited by social determinants of health.        Final diagnoses:  Influenza B  Moderate persistent asthma with exacerbation    ED Discharge Orders          Ordered    methylPREDNISolone  (MEDROL  DOSEPAK) 4 MG TBPK tablet        05/12/24  2148    benzonatate  (TESSALON ) 100 MG capsule  Every 8 hours        05/12/24 2148    cetirizine  (ZYRTEC  ALLERGY) 10 MG tablet  Daily        05/12/24 2148    fluticasone  (FLONASE ) 50 MCG/ACT nasal spray  Daily        05/12/24 2148    albuterol  (PROVENTIL ) (2.5 MG/3ML) 0.083% nebulizer solution  Every 4 hours PRN        05/12/24 2148               Isyss Espinal A, PA-C 05/12/24 2150    Floyd, Dan, DO 05/12/24 2156  "

## 2024-05-12 NOTE — ED Triage Notes (Signed)
 Pt arrived POV stating she got sick last week with body aches, chills, fever, SHOB, sore throat. Pt attempted to rest his week but does not feel better.

## 2024-05-12 NOTE — Discharge Instructions (Addendum)
 Your FLU B test was positive this is likely contributing to your symptoms.    You are also having an asthma exacerbation.  I written you for a few medications to help with your symptoms  Medrol -this is prednisone  a steroid that will help to open up your lungs Flonase -nasal spray to help with any congestion or rhinorrhea Tessalon -medication to help with cough Albuterol  nebulizer solution I placed a refill   May also take additional Tylenol  ibuprofen  at home  May use a cool-mist humidifier at home as well  Make sure to follow-up outpatient, return for any worsening symptoms
